# Patient Record
Sex: Female | Born: 1950
Health system: Southern US, Community
[De-identification: ages and names within clinical notes are randomized; demographics above are authoritative.]

## PROBLEM LIST (undated history)

## (undated) DIAGNOSIS — E785 Hyperlipidemia, unspecified: Secondary | ICD-10-CM

## (undated) DIAGNOSIS — M7750 Other enthesopathy of unspecified foot: Secondary | ICD-10-CM

## (undated) DIAGNOSIS — K5732 Diverticulitis of large intestine without perforation or abscess without bleeding: Secondary | ICD-10-CM

## (undated) DIAGNOSIS — K219 Gastro-esophageal reflux disease without esophagitis: Secondary | ICD-10-CM

## (undated) DIAGNOSIS — E049 Nontoxic goiter, unspecified: Secondary | ICD-10-CM

## (undated) DIAGNOSIS — Z6841 Body Mass Index (BMI) 40.0 and over, adult: Secondary | ICD-10-CM

## (undated) DIAGNOSIS — E559 Vitamin D deficiency, unspecified: Secondary | ICD-10-CM

## (undated) DIAGNOSIS — E119 Type 2 diabetes mellitus without complications: Secondary | ICD-10-CM

## (undated) DIAGNOSIS — F419 Anxiety disorder, unspecified: Secondary | ICD-10-CM

## (undated) DIAGNOSIS — I10 Essential (primary) hypertension: Secondary | ICD-10-CM

## (undated) DIAGNOSIS — Z8619 Personal history of other infectious and parasitic diseases: Secondary | ICD-10-CM

## (undated) DIAGNOSIS — Z86010 Personal history of colonic polyps: Secondary | ICD-10-CM

## (undated) DIAGNOSIS — L219 Seborrheic dermatitis, unspecified: Secondary | ICD-10-CM

## (undated) DIAGNOSIS — E039 Hypothyroidism, unspecified: Secondary | ICD-10-CM

## (undated) DIAGNOSIS — M1990 Unspecified osteoarthritis, unspecified site: Secondary | ICD-10-CM

## (undated) HISTORY — DX: Diverticulitis of large intestine without perforation or abscess without bleeding: K57.32

## (undated) HISTORY — DX: Body Mass Index (BMI) 40.0 and over, adult: Z684

## (undated) HISTORY — DX: Gastro-esophageal reflux disease without esophagitis: K21.9

## (undated) HISTORY — PX: POLYPECTOMY: SHX149

## (undated) HISTORY — DX: Seborrheic dermatitis, unspecified: L21.9

## (undated) HISTORY — PX: VAGINAL HYSTERECTOMY: SUR661

## (undated) HISTORY — DX: Personal history of colonic polyps: Z86.010

## (undated) HISTORY — DX: Vitamin D deficiency, unspecified: E55.9

## (undated) HISTORY — PX: TONSILLECTOMY: SUR1361

## (undated) HISTORY — DX: Nontoxic goiter, unspecified: E04.9

## (undated) HISTORY — PX: COLONOSCOPY: SHX174

## (undated) HISTORY — DX: Personal history of other infectious and parasitic diseases: Z86.19

## (undated) HISTORY — DX: Morbid (severe) obesity due to excess calories: E66.01

## (undated) HISTORY — DX: Anxiety disorder, unspecified: F41.9

## (undated) HISTORY — DX: Hypothyroidism, unspecified: E03.9

## (undated) HISTORY — DX: Unspecified osteoarthritis, unspecified site: M19.90

## (undated) HISTORY — PX: UPPER GASTROINTESTINAL ENDOSCOPY: SHX188

## (undated) HISTORY — DX: Hyperlipidemia, unspecified: E78.5

---

## 2000-11-06 ENCOUNTER — Emergency Department (HOSPITAL_COMMUNITY): Admission: EM | Admit: 2000-11-06 | Discharge: 2000-11-06 | Payer: Self-pay | Admitting: Emergency Medicine

## 2001-10-12 ENCOUNTER — Other Ambulatory Visit: Admission: RE | Admit: 2001-10-12 | Discharge: 2001-10-12 | Payer: Self-pay | Admitting: *Deleted

## 2002-01-13 ENCOUNTER — Encounter: Payer: Self-pay | Admitting: Internal Medicine

## 2002-01-13 ENCOUNTER — Encounter: Admission: RE | Admit: 2002-01-13 | Discharge: 2002-01-13 | Payer: Self-pay | Admitting: Internal Medicine

## 2002-02-16 ENCOUNTER — Ambulatory Visit (HOSPITAL_COMMUNITY): Admission: RE | Admit: 2002-02-16 | Discharge: 2002-02-16 | Payer: Self-pay | Admitting: *Deleted

## 2002-03-15 HISTORY — PX: COLONOSCOPY: SHX174

## 2002-05-25 ENCOUNTER — Emergency Department (HOSPITAL_COMMUNITY): Admission: EM | Admit: 2002-05-25 | Discharge: 2002-05-25 | Payer: Self-pay | Admitting: Emergency Medicine

## 2002-09-12 ENCOUNTER — Other Ambulatory Visit: Admission: RE | Admit: 2002-09-12 | Discharge: 2002-09-12 | Payer: Self-pay | Admitting: *Deleted

## 2003-05-25 ENCOUNTER — Encounter (INDEPENDENT_AMBULATORY_CARE_PROVIDER_SITE_OTHER): Payer: Self-pay | Admitting: Specialist

## 2003-05-25 ENCOUNTER — Ambulatory Visit (HOSPITAL_COMMUNITY): Admission: RE | Admit: 2003-05-25 | Discharge: 2003-05-25 | Payer: Self-pay | Admitting: *Deleted

## 2003-05-25 DIAGNOSIS — Z8619 Personal history of other infectious and parasitic diseases: Secondary | ICD-10-CM

## 2003-05-25 HISTORY — DX: Personal history of other infectious and parasitic diseases: Z86.19

## 2003-05-25 HISTORY — PX: ESOPHAGOGASTRODUODENOSCOPY: SHX1529

## 2006-01-21 ENCOUNTER — Emergency Department (HOSPITAL_COMMUNITY): Admission: EM | Admit: 2006-01-21 | Discharge: 2006-01-21 | Payer: Self-pay | Admitting: Emergency Medicine

## 2006-09-10 ENCOUNTER — Encounter: Admission: RE | Admit: 2006-09-10 | Discharge: 2006-09-10 | Payer: Self-pay | Admitting: Internal Medicine

## 2006-09-22 ENCOUNTER — Other Ambulatory Visit: Admission: RE | Admit: 2006-09-22 | Discharge: 2006-09-22 | Payer: Self-pay | Admitting: Interventional Radiology

## 2006-09-22 ENCOUNTER — Encounter: Admission: RE | Admit: 2006-09-22 | Discharge: 2006-09-22 | Payer: Self-pay | Admitting: Internal Medicine

## 2006-09-22 ENCOUNTER — Encounter (INDEPENDENT_AMBULATORY_CARE_PROVIDER_SITE_OTHER): Payer: Self-pay | Admitting: Specialist

## 2006-10-31 ENCOUNTER — Emergency Department (HOSPITAL_COMMUNITY): Admission: EM | Admit: 2006-10-31 | Discharge: 2006-10-31 | Payer: Self-pay | Admitting: Emergency Medicine

## 2007-07-19 ENCOUNTER — Other Ambulatory Visit: Admission: RE | Admit: 2007-07-19 | Discharge: 2007-07-19 | Payer: Self-pay | Admitting: *Deleted

## 2008-05-28 ENCOUNTER — Encounter: Admission: RE | Admit: 2008-05-28 | Discharge: 2008-05-28 | Payer: Self-pay | Admitting: Endocrinology

## 2008-11-12 ENCOUNTER — Encounter: Admission: RE | Admit: 2008-11-12 | Discharge: 2008-11-12 | Payer: Self-pay | Admitting: Endocrinology

## 2009-07-14 ENCOUNTER — Emergency Department (HOSPITAL_COMMUNITY): Admission: EM | Admit: 2009-07-14 | Discharge: 2009-07-14 | Payer: Self-pay | Admitting: Emergency Medicine

## 2009-09-19 ENCOUNTER — Other Ambulatory Visit: Admission: RE | Admit: 2009-09-19 | Discharge: 2009-09-19 | Payer: Self-pay | Admitting: Internal Medicine

## 2010-06-04 ENCOUNTER — Encounter: Admission: RE | Admit: 2010-06-04 | Discharge: 2010-06-04 | Payer: Self-pay | Admitting: Endocrinology

## 2010-08-11 ENCOUNTER — Emergency Department (HOSPITAL_COMMUNITY): Admission: EM | Admit: 2010-08-11 | Discharge: 2010-08-11 | Payer: Self-pay | Admitting: Family Medicine

## 2010-12-10 ENCOUNTER — Other Ambulatory Visit: Payer: Self-pay | Admitting: Internal Medicine

## 2010-12-10 ENCOUNTER — Ambulatory Visit
Admission: RE | Admit: 2010-12-10 | Discharge: 2010-12-10 | Disposition: A | Discharge status: Home / Self Care | Payer: Managed Care, Other (non HMO) | Source: Ambulatory Visit | Attending: Internal Medicine | Admitting: Internal Medicine

## 2010-12-10 DIAGNOSIS — M79602 Pain in left arm: Secondary | ICD-10-CM

## 2011-01-15 LAB — URINE CULTURE
Colony Count: >=100000
Culture  Setup Time: 201110110146

## 2011-01-15 LAB — POCT URINALYSIS DIPSTICK
Ketones, ur: NEGATIVE mg/dL
Nitrite: POSITIVE — AB
Protein, ur: 100 mg/dL — AB
pH: 5.5 (ref 5.0–8.0)

## 2011-03-20 NOTE — Op Note (Signed)
   Sandy Daniels, Sandy Daniels                            ACCOUNT NO.:  [id number]   MEDICAL RECORD NO.:  [id number]                   PATIENT TYPE:  AMB   LOCATION:  ENDO                                 FACILITY:  [facility]   PHYSICIAN:  Georgiana Spinner, M.D.                 DATE OF BIRTH:  January 03, 1951   DATE OF PROCEDURE:  DATE OF DISCHARGE:                                 OPERATIVE REPORT   PROCEDURE:  Upper endoscopy with biopsy.   INDICATIONS FOR PROCEDURE:  Gastroesophageal reflux disease.   ANESTHESIA:  Demerol 80, Versed 8 mg.   DESCRIPTION OF PROCEDURE:  With the patient mildly sedated in the left  lateral decubitus position, the Olympus videoscopic endoscope was inserted  in the mouth and passed under direct vision through the esophagus which  appeared normal. The distal esophagus was not well seen to clearly delineate  the GE junction well therefore it was photographed and biopsies were taken  of this area to detect any evidence of Barrett's. We entered into the  stomach. The fundus, body, antrum, duodenal bulb and second portion of the  duodenum were visualized. From this point, the endoscope was slowly  withdrawn taking circumferential views of the duodenal mucosa until the  endoscope was then pulled back in the stomach, placed in retroflexion to  view the stomach from below. The endoscope was straightened and withdrawn  taking circumferential views of the remaining gastric and esophageal mucosa  stopping to photograph and biopsy along the way changes of what appeared to  be gastritis localized at the body and less so in the fundus of the stomach.  The patient's vital signs and pulse oximeter remained stable. The patient  tolerated the procedure well without apparent complications.   FINDINGS:  Changes of gastritis biopsied and biopsy of distal esophagus.  Await biopsy report. The patient will call me for results and followup with  me as an outpatient.                       Georgiana Spinner, M.D.    GMO/MEDQ  D:  05/25/2003  T:  05/25/2003  Job:  [id number]

## 2011-03-20 NOTE — Procedures (Signed)
[facility]  Patient:    Sandy Daniels, Sandy Daniels Visit Number: [id number] MRN: [id number]          Service Type: END Location: ENDO Attending Physician:  Sabino Gasser Dictated by:   Sabino Gasser, M.D. Proc. Date: 03/15/02 Admit Date:  02/16/2002 Discharge Date: 02/16/2002                             Procedure Report  PROCEDURE:  Colonoscopy.  INDICATION FOR PROCEDURE:  Rectal bleeding.  ANESTHESIA:  Demerol 80, Versed 6 mg.  DESCRIPTION OF PROCEDURE:  With the patient mildly sedated in the left lateral decubitus position, the Olympus videoscopic pediatric colonoscope was inserted in the rectum and passed under direct vision to the cecum identified by the ileocecal valve and appendiceal orifice both of which were photographed. From this point, the colonoscope was slowly withdrawn taking circumferential views of the entire colonic mucosa, stopping in the rectum which appeared normal in direct and showed hemorrhoids on retroflexed view.  The endoscope was straightened and withdrawn. The patients vital signs and pulse oximeter remained stable. The patient tolerated the procedure well without apparent complications.  FINDINGS:  Internal hemorrhoids otherwise unremarkable examination. Dictated by:   Sabino Gasser, M.D. Attending Physician:  Sabino Gasser DD:  03/15/02 TD:  03/16/02 Job: [id number]

## 2011-06-02 ENCOUNTER — Other Ambulatory Visit: Payer: Self-pay | Admitting: Endocrinology

## 2011-06-02 DIAGNOSIS — E041 Nontoxic single thyroid nodule: Secondary | ICD-10-CM

## 2011-06-09 ENCOUNTER — Ambulatory Visit
Admission: RE | Admit: 2011-06-09 | Discharge: 2011-06-09 | Disposition: A | Discharge status: Home / Self Care | Payer: Managed Care, Other (non HMO) | Source: Ambulatory Visit | Attending: Endocrinology | Admitting: Endocrinology

## 2011-06-09 ENCOUNTER — Other Ambulatory Visit: Payer: Self-pay | Admitting: Endocrinology

## 2011-06-09 DIAGNOSIS — E041 Nontoxic single thyroid nodule: Secondary | ICD-10-CM

## 2011-10-08 HISTORY — PX: TRANSTHORACIC ECHOCARDIOGRAM: SHX275

## 2011-10-08 HISTORY — PX: NM MYOVIEW LTD: HXRAD82

## 2011-10-21 ENCOUNTER — Ambulatory Visit: Payer: Managed Care, Other (non HMO) | Admitting: *Deleted

## 2011-11-04 ENCOUNTER — Ambulatory Visit: Payer: Managed Care, Other (non HMO) | Admitting: *Deleted

## 2013-01-31 DIAGNOSIS — K5732 Diverticulitis of large intestine without perforation or abscess without bleeding: Secondary | ICD-10-CM

## 2013-01-31 HISTORY — DX: Diverticulitis of large intestine without perforation or abscess without bleeding: K57.32

## 2013-02-25 ENCOUNTER — Encounter (HOSPITAL_COMMUNITY): Payer: Self-pay

## 2013-02-25 ENCOUNTER — Emergency Department (HOSPITAL_COMMUNITY)
Admission: EM | Admit: 2013-02-25 | Discharge: 2013-02-26 | Disposition: A | Discharge status: Home / Self Care | Payer: Managed Care, Other (non HMO) | Attending: Internal Medicine | Admitting: Internal Medicine

## 2013-02-25 ENCOUNTER — Emergency Department (HOSPITAL_COMMUNITY): Payer: Managed Care, Other (non HMO)

## 2013-02-25 DIAGNOSIS — Z90710 Acquired absence of both cervix and uterus: Secondary | ICD-10-CM | POA: Insufficient documentation

## 2013-02-25 DIAGNOSIS — Z79899 Other long term (current) drug therapy: Secondary | ICD-10-CM | POA: Insufficient documentation

## 2013-02-25 DIAGNOSIS — D72829 Elevated white blood cell count, unspecified: Secondary | ICD-10-CM | POA: Insufficient documentation

## 2013-02-25 DIAGNOSIS — R52 Pain, unspecified: Secondary | ICD-10-CM | POA: Insufficient documentation

## 2013-02-25 DIAGNOSIS — R109 Unspecified abdominal pain: Secondary | ICD-10-CM

## 2013-02-25 DIAGNOSIS — R609 Edema, unspecified: Secondary | ICD-10-CM | POA: Insufficient documentation

## 2013-02-25 DIAGNOSIS — K5900 Constipation, unspecified: Secondary | ICD-10-CM | POA: Insufficient documentation

## 2013-02-25 DIAGNOSIS — F17200 Nicotine dependence, unspecified, uncomplicated: Secondary | ICD-10-CM | POA: Insufficient documentation

## 2013-02-25 DIAGNOSIS — K5732 Diverticulitis of large intestine without perforation or abscess without bleeding: Secondary | ICD-10-CM

## 2013-02-25 DIAGNOSIS — Z8739 Personal history of other diseases of the musculoskeletal system and connective tissue: Secondary | ICD-10-CM | POA: Insufficient documentation

## 2013-02-25 DIAGNOSIS — K5792 Diverticulitis of intestine, part unspecified, without perforation or abscess without bleeding: Secondary | ICD-10-CM

## 2013-02-25 DIAGNOSIS — R1032 Left lower quadrant pain: Secondary | ICD-10-CM | POA: Insufficient documentation

## 2013-02-25 DIAGNOSIS — E669 Obesity, unspecified: Secondary | ICD-10-CM | POA: Insufficient documentation

## 2013-02-25 DIAGNOSIS — M129 Arthropathy, unspecified: Secondary | ICD-10-CM | POA: Insufficient documentation

## 2013-02-25 DIAGNOSIS — Z880 Allergy status to penicillin: Secondary | ICD-10-CM | POA: Insufficient documentation

## 2013-02-25 DIAGNOSIS — R142 Eructation: Secondary | ICD-10-CM | POA: Insufficient documentation

## 2013-02-25 DIAGNOSIS — I10 Essential (primary) hypertension: Secondary | ICD-10-CM | POA: Insufficient documentation

## 2013-02-25 DIAGNOSIS — R141 Gas pain: Secondary | ICD-10-CM | POA: Insufficient documentation

## 2013-02-25 DIAGNOSIS — E039 Hypothyroidism, unspecified: Secondary | ICD-10-CM | POA: Insufficient documentation

## 2013-02-25 HISTORY — DX: Essential (primary) hypertension: I10

## 2013-02-25 HISTORY — DX: Other enthesopathy of unspecified foot and ankle: M77.50

## 2013-02-25 LAB — URINALYSIS, ROUTINE W REFLEX MICROSCOPIC
Bilirubin Urine: NEGATIVE
Glucose, UA: NEGATIVE mg/dL
Hgb urine dipstick: NEGATIVE
Ketones, ur: NEGATIVE mg/dL
Protein, ur: NEGATIVE mg/dL
pH: 5.5 (ref 5.0–8.0)

## 2013-02-25 LAB — COMPREHENSIVE METABOLIC PANEL
ALT: 16 U/L (ref 0–35)
AST: 15 U/L (ref 0–37)
Albumin: 3.1 g/dL — ABNORMAL LOW (ref 3.5–5.2)
Alkaline Phosphatase: 82 U/L (ref 39–117)
CO2: 26 mEq/L (ref 19–32)
Chloride: 103 mEq/L (ref 96–112)
Creatinine, Ser: 0.86 mg/dL (ref 0.50–1.10)
GFR calc non Af Amer: 70 mL/min — ABNORMAL LOW (ref >90)
Potassium: 3.5 mEq/L (ref 3.5–5.1)
Sodium: 137 mEq/L (ref 135–145)
Total Bilirubin: 0.3 mg/dL (ref 0.3–1.2)

## 2013-02-25 LAB — CBC WITH DIFFERENTIAL/PLATELET
Basophils Absolute: 0 10*3/uL (ref 0.0–0.1)
Basophils Relative: 0 % (ref 0–1)
HCT: 42.2 % (ref 36.0–46.0)
Lymphocytes Relative: 23 % (ref 12–46)
MCHC: 33.4 g/dL (ref 30.0–36.0)
Monocytes Absolute: 1.1 10*3/uL — ABNORMAL HIGH (ref 0.1–1.0)
Neutro Abs: 12.2 10*3/uL — ABNORMAL HIGH (ref 1.7–7.7)
Neutrophils Relative %: 70 % (ref 43–77)
RDW: 14.6 % (ref 11.5–15.5)
WBC: 17.5 10*3/uL — ABNORMAL HIGH (ref 4.0–10.5)

## 2013-02-25 LAB — URINE MICROSCOPIC-ADD ON

## 2013-02-25 MED ORDER — IOHEXOL 300 MG/ML  SOLN
50.0000 mL | Freq: Once | INTRAMUSCULAR | Status: AC | PRN
  Administered 2013-02-25: 50 mL via ORAL

## 2013-02-25 MED ORDER — SODIUM CHLORIDE 0.9 % IV SOLN
1000.0000 mL | INTRAVENOUS | Status: DC
  Administered 2013-02-25: 1000 mL via INTRAVENOUS

## 2013-02-25 MED ORDER — LORAZEPAM 2 MG/ML IJ SOLN
1.0000 mg | Freq: Once | INTRAMUSCULAR | Status: AC
  Administered 2013-02-25: 1 mg via INTRAVENOUS
  Filled 2013-02-25: qty 1

## 2013-02-25 MED ORDER — ONDANSETRON HCL 4 MG/2ML IJ SOLN
4.0000 mg | Freq: Once | INTRAMUSCULAR | Status: AC
  Administered 2013-02-25: 4 mg via INTRAVENOUS
  Filled 2013-02-25: qty 2

## 2013-02-25 MED ORDER — CIPROFLOXACIN HCL 500 MG PO TABS: 500.0000 mg | ORAL_TABLET | Freq: Two times a day (BID) | ORAL | Status: DC

## 2013-02-25 MED ORDER — MORPHINE SULFATE 4 MG/ML IJ SOLN
4.0000 mg | Freq: Once | INTRAMUSCULAR | Status: AC
  Administered 2013-02-25: 4 mg via INTRAVENOUS
  Filled 2013-02-25: qty 1

## 2013-02-25 MED ORDER — SODIUM CHLORIDE 0.9 % IV SOLN: 1000.0000 mL | INTRAVENOUS | Status: DC

## 2013-02-25 MED ORDER — CIPROFLOXACIN IN D5W 400 MG/200ML IV SOLN
400.0000 mg | Freq: Once | INTRAVENOUS | Status: AC
  Administered 2013-02-25: 400 mg via INTRAVENOUS
  Filled 2013-02-25: qty 200

## 2013-02-25 MED ORDER — METRONIDAZOLE IN NACL 5-0.79 MG/ML-% IV SOLN
500.0000 mg | Freq: Once | INTRAVENOUS | Status: AC
  Administered 2013-02-25: 500 mg via INTRAVENOUS
  Filled 2013-02-25: qty 100

## 2013-02-25 MED ORDER — IOHEXOL 300 MG/ML  SOLN
100.0000 mL | Freq: Once | INTRAMUSCULAR | Status: AC | PRN
  Administered 2013-02-25: 100 mL via INTRAVENOUS

## 2013-02-25 MED ORDER — OXYCODONE-ACETAMINOPHEN 5-325 MG PO TABS: 1.0000 | ORAL_TABLET | ORAL | Status: DC | PRN

## 2013-02-25 MED ORDER — METRONIDAZOLE 500 MG PO TABS: 500.0000 mg | ORAL_TABLET | Freq: Two times a day (BID) | ORAL | Status: DC

## 2013-02-25 NOTE — ED Provider Notes (Signed)
Medical screening examination/treatment/procedure(s) were conducted as a shared visit with non-physician practitioner(s) and myself.  I personally evaluated the patient during the encounter  Pt seen and examined--ct shows diverticulitis with possible abscess, abx started, pt to be admitted by triad  Toy Baker, MD 02/25/13 2147

## 2013-02-25 NOTE — ED Notes (Signed)
Patient transported to CT 

## 2013-02-25 NOTE — ED Notes (Signed)
Pt presents with stomach pain since Thursday.  Pt has has laxatives, fleets enema and suppository  without relief.  LBM Wednesday.  Denies N/V/D and fever

## 2013-02-25 NOTE — ED Provider Notes (Signed)
Medical screening examination/treatment/procedure(s) were performed by non-physician practitioner and as supervising physician I was immediately available for consultation/collaboration.  Kalyssa Anker T Kinnie Kaupp, MD 02/25/13 2317 

## 2013-02-25 NOTE — ED Notes (Signed)
MD at bedside. 

## 2013-02-25 NOTE — ED Provider Notes (Signed)
History     CSN: [id number]  Arrival date & time 02/25/13  7829   First MD Initiated Contact with Patient 02/25/13 1813      Chief Complaint  Patient presents with  . Constipation  . Abdominal Pain    (Consider location/radiation/quality/duration/timing/severity/associated sxs/prior treatment) Patient is a 62 y.o. female presenting with constipation and abdominal pain. The history is provided by the patient and medical records. No language interpreter was used.  Constipation  The current episode started 3 to 5 days ago. The onset was gradual. The problem has been gradually worsening. The pain is moderate. The stool is described as hard. There was no prior successful therapy. Prior unsuccessful therapies include stool softeners, laxatives and enemas. Associated symptoms include abdominal pain. Pertinent negatives include no anorexia, no fever, no diarrhea, no hemorrhoids, no nausea, no rectal pain, no vomiting, no hematuria, no vaginal bleeding, no vaginal discharge, no chest pain, no headaches, no coughing, no difficulty breathing and no rash. She has been behaving normally. She has been eating and drinking normally. Urine output has been normal. Her past medical history does not include inflammatory bowel disease or recent abdominal injury. There were no sick contacts. She has received no recent medical care.  Abdominal Pain Pain location:  Suprapubic and LLQ Pain quality: cramping, sharp and shooting   Pain radiates to:  Does not radiate Pain severity:  Severe Onset quality:  Gradual Duration:  2 days Timing:  Constant Progression:  Waxing and waning Chronicity:  New Context: laxative use   Context: not sick contacts   Relieved by:  Nothing Worsened by:  Nothing tried Associated symptoms: constipation   Associated symptoms: no anorexia, no chest pain, no cough, no diarrhea, no dysuria, no fatigue, no fever, no hematuria, no nausea, no shortness of breath, no vaginal bleeding, no  vaginal discharge and no vomiting   Risk factors: obesity     Sandy Daniels is a 62 y.o. female  with a hx of hypertension, hypothyroid presents to the Emergency Department complaining of gradual, persistent, progressively worsening suprapubic and left lower part abdominal pain described as cramping and sharp, rated at a 8/10, nonradiating onset yesterday afternoon. Patient states her last bowel movement was on Wednesday and she feels that she has been constipated. Patient has been taking laxatives, suppositories and Fleet enema without reduction of a bowel movement. Patient states her bowel movement Wednesday was hard but there was no blood. She has no history of hemorrhoids and there is no pain with bowel movement. She denies nausea vomiting, diarrhea, fever, chills, headache, neck pain, chest pain, shortness of breath, weakness, dizziness, syncope, dysuria, hematuria, vaginal discharge. Patient has had a partial hysterectomy with removal of her uterus which was done transabdominally. Nothing makes the pain better and nothing makes the pain worse.   Patient states she is passing flatus, but this does change her ssx.  Past Medical History  Diagnosis Date  . Hypertension   . Thyroid disease   . Arthritis   . Tendonitis of ankle     History reviewed. No pertinent past surgical history.  No family history on file.  History  Substance Use Topics  . Smoking status: Current Every Day Smoker  . Smokeless tobacco: Not on file  . Alcohol Use: No    OB History   Grav Para Term Preterm Abortions TAB SAB Ect Mult Living                  Review of Systems  Constitutional: Negative for fever, diaphoresis, appetite change, fatigue and unexpected weight change.  HENT: Negative for mouth sores, trouble swallowing, neck pain and neck stiffness.   Respiratory: Negative for cough, chest tightness, shortness of breath, wheezing and stridor.   Cardiovascular: Negative for chest pain and palpitations.   Gastrointestinal: Positive for abdominal pain, constipation and abdominal distention. Negative for nausea, vomiting, diarrhea, blood in stool, rectal pain, anorexia and hemorrhoids.  Genitourinary: Negative for dysuria, urgency, frequency, hematuria, flank pain, vaginal bleeding, vaginal discharge and difficulty urinating.  Musculoskeletal: Negative for back pain.  Skin: Negative for rash.  Neurological: Negative for weakness and headaches.  Hematological: Negative for adenopathy.  Psychiatric/Behavioral: Negative for confusion.  All other systems reviewed and are negative.    Allergies  Penicillins; Betadine; and Neosporin  Home Medications   Current Outpatient Rx  Name  Route  Sig  Dispense  Refill  . levothyroxine (SYNTHROID, LEVOTHROID) 100 MCG tablet   Oral   Take 100 mcg by mouth daily before breakfast.         . triamterene-hydrochlorothiazide (MAXZIDE-25) 37.5-25 MG per tablet   Oral   Take 1 tablet by mouth daily.           BP 172/87  Temp(Src) 99.1 F (37.3 C) (Oral)  Resp 20  Ht 5\' 6"  (1.676 m)  Wt 289 lb (131.09 kg)  BMI 46.67 kg/m2  SpO2 100%  Physical Exam  Nursing note and vitals reviewed. Constitutional: She is oriented to person, place, and time. She appears well-developed and well-nourished.  HENT:  Head: Normocephalic and atraumatic.  Mouth/Throat: Oropharynx is clear and moist.  Eyes: Conjunctivae and EOM are normal. Pupils are equal, round, and reactive to light. No scleral icterus.  Neck: Normal range of motion and full passive range of motion without pain. No rigidity.  Cardiovascular: Normal rate, regular rhythm, S1 normal, S2 normal, normal heart sounds and intact distal pulses.   Pulses:      Radial pulses are 2+ on the right side, and 2+ on the left side.       Dorsalis pedis pulses are 2+ on the right side, and 2+ on the left side.       Posterior tibial pulses are 2+ on the right side, and 2+ on the left side.  Pulmonary/Chest:  Effort normal and breath sounds normal. No accessory muscle usage. Not tachypneic. No respiratory distress. She has no decreased breath sounds. She has no wheezes. She has no rhonchi. She has no rales. She exhibits no tenderness and no bony tenderness.  Abdominal: Soft. Normal appearance and bowel sounds are normal. She exhibits no distension and no mass. There is tenderness in the suprapubic area and left lower quadrant. There is guarding. There is no rigidity, no rebound and no CVA tenderness.  Obese abdomen  Musculoskeletal: Normal range of motion. She exhibits edema (chronic L leg, nonpitting). She exhibits no tenderness.  Lymphadenopathy:    She has no cervical adenopathy.  Neurological: She is alert and oriented to person, place, and time. She exhibits normal muscle tone. Coordination normal.  Skin: Skin is warm and dry. No rash noted. No erythema.  Psychiatric: She has a normal mood and affect.    ED Course  Procedures (including critical care time)  Labs Reviewed  CBC WITH DIFFERENTIAL - Abnormal; Notable for the following:    WBC 17.5 (*)    Neutro Abs 12.2 (*)    Monocytes Absolute 1.1 (*)    All other components within normal limits  COMPREHENSIVE METABOLIC PANEL - Abnormal; Notable for the following:    Glucose, Bld 149 (*)    Albumin 3.1 (*)    GFR calc non Af Amer 70 (*)    GFR calc Af Amer 82 (*)    All other components within normal limits  URINALYSIS, ROUTINE W REFLEX MICROSCOPIC - Abnormal; Notable for the following:    Leukocytes, UA SMALL (*)    All other components within normal limits  LIPASE, BLOOD  URINE MICROSCOPIC-ADD ON  OCCULT BLOOD, POC DEVICE   Ct Abdomen Pelvis W Contrast  02/25/2013  *RADIOLOGY REPORT*  Clinical Data: Abdominal pain  CT ABDOMEN AND PELVIS WITH CONTRAST  Technique:  Multidetector CT imaging of the abdomen and pelvis was performed following the standard protocol during bolus administration of intravenous contrast.  Contrast:  OMNIPAQUE IOHEXOL 300 MG/ML  SOLN  Comparison: None.  Findings: Minimal curvilinear bilateral lower lobe atelectasis or scarring noted.  6 mm too small to characterize left posterior mid renal cortical hypodensity, image 33.  Liver, gallbladder, spleen, pancreas, adrenal glands, and right kidney are normal.  Moderate atheromatous aortic calcification without aneurysm.  Short segmental sigmoid colonic wall thickening, mesenteric fat stranding, and trace adjacent fluid is identified, for example image 56.  More focal amorphous fluid density adjacent to this segment measuring 2.2 cm image 56 could indicate developing phlegmon or early abscess.  Ovaries are unremarkable.  Uterus presumed surgically absent. Bladder is normal.  Small bowel is unremarkable.  Colonic diverticuli are noted.  No free air, lymphadenopathy, or ascites.  No acute osseous abnormality. Lumbar spine degenerative change identified.  IMPRESSION: Short segmental sigmoid colonic wall thickening, surrounding stranding, and trace pericolonic fluid, most compatible with diverticulitis.  More focal amorphous fluid density adjacent to this segment measuring 2.2 cm image 56 could indicate developing phlegmon or early abscess.  After symptoms resolve, if the patient has not had screening colonoscopy, this could be considered to exclude underlying malignancy.   Original Report Authenticated By: Christiana Pellant, M.D.      1. Diverticulitis   2. Abdominal pain, acute       MDM  Donnamae Jude presents with left lower quadrant abdominal pain x3 days and constipation.  Patient with leukocytosis of 17.5, lipase and CMP within normal limits, fecal occult negative and no impaction palpable on rectal exam, UA without evidence of urinary tract infection, CT scan with diverticulitis and fluid density returning for phlegmon or early abscess.    Patient alert, oriented, nontoxic, nonseptic appearing.  Vital signs stable. Patient primary care with [facility]  medical, Dr Merri Brunette.  Will proceed with admission.    Dr. Freida Busman was consulted, evaluated this patient with me and agrees with the plan.    10:50 PM Pt offered admission by Dr Ardyth Harps and she has refused at this time.  Pt has been counseled about the importance of strict follow-up.  I have also discussed reasons to return immediately to the ER.  Patient expresses understanding and agrees with plan.  Will d/c home with Cipro and Flagyl for 21 days per the recommendation of Dr Ardyth Harps.     Lauro Ronne Savoia, PA-C 02/25/13 2255

## 2013-02-25 NOTE — Consult Note (Signed)
Requesting physician: Lorre Nick, EDP  Primary Care Physician: Londell Moh, MD  Reason for consultation: Abdominal pain, CT scan consistent with diverticulitis   History of Present Illness: Patient is a pleasant 62 year old obese African American woman with past medical history significant for hypertension and hypothyroidism. She presents today with 2 days of left lower quadrant abdominal pain and constipation. She denies fever or chills. Also denies nausea or vomiting. Has been afebrile in the emergency department she does have a white blood cell count of 17.5. CT scan was consistent with diverticulitis and possibly an early phlegmon. I have been asked to see her regarding potential admission.  Allergies:   Allergies  Allergen Reactions  . Penicillins Hives and Swelling  . Betadine (Povidone Iodine) Rash    Severe bumps and blistering  . Neosporin (Neomycin-Bacitracin Zn-Polymyx) Rash    blistering      Past Medical History  Diagnosis Date  . Hypertension   . Thyroid disease   . Arthritis   . Tendonitis of ankle     History reviewed. No pertinent past surgical history.  Scheduled Meds:  Continuous Infusions: . sodium chloride 1,000 mL (02/25/13 1852)  . sodium chloride    . metronidazole     PRN Meds:.  Social History:  reports that she has been smoking.  She does not have any smokeless tobacco history on file. She reports that she does not drink alcohol or use illicit drugs.  No family history on file.  Review of Systems:  Constitutional: Denies fever, chills, diaphoresis, appetite change and fatigue.  HEENT: Denies photophobia, eye pain, redness, hearing loss, ear pain, congestion, sore throat, rhinorrhea, sneezing, mouth sores, trouble swallowing, neck pain, neck stiffness and tinnitus.   Respiratory: Denies SOB, DOE, cough, chest tightness,  and wheezing.   Cardiovascular: Denies chest pain, palpitations and leg swelling.  Gastrointestinal: Denies  nausea, vomiting,  diarrhea, constipation, blood in stool and abdominal distention.  Genitourinary: Denies dysuria, urgency, frequency, hematuria, flank pain and difficulty urinating.  Musculoskeletal: Denies myalgias, back pain, joint swelling, arthralgias and gait problem.  Skin: Denies pallor, rash and wound.  Neurological: Denies dizziness, seizures, syncope, weakness, light-headedness, numbness and headaches.  Hematological: Denies adenopathy. Easy bruising, personal or family bleeding history  Psychiatric/Behavioral: Denies suicidal ideation, mood changes, confusion, nervousness, sleep disturbance and agitation   Physical Exam: Blood pressure 137/73, pulse 95, temperature 99.1 F (37.3 C), temperature source Oral, resp. rate 18, height 5\' 6"  (1.676 m), weight 131.09 kg (289 lb), SpO2 100.00%. General: Alert, awake, oriented x3, no acute distress, morbidly obese. HEENT: Normocephalic, atraumatic, pupils equal round and reactive to light, intact extraocular movements, moist mucous membranes. Neck: Supple, no JVD, no lymphadenopathy, no bruits, no goiter. Cardiovascular: Regular rate and rhythm, no murmurs, rubs or gallops. Lungs: Clear to auscultation bilaterally. Abdomen: Obese, soft, nontender to palpation, nondistended, positive bowel sounds, no masses organomegaly noted, no rebound or guarding. Extremities: 2+ pitting edema bilaterally, positive pedal pulses. Neurologic: Grossly intact and nonfocal.  Labs on Admission:  Results for orders placed during the hospital encounter of 02/25/13 (from the past 48 hour(s))  CBC WITH DIFFERENTIAL     Status: Abnormal   Collection Time    02/25/13  6:45 PM      Result Value Range   WBC 17.5 (*) 4.0 - 10.5 K/uL   RBC 5.02  3.87 - 5.11 MIL/uL   Hemoglobin 14.1  12.0 - 15.0 g/dL   HCT 16.1  09.6 - 04.5 %   MCV 84.1  78.0 - 100.0 fL   MCH 28.1  26.0 - 34.0 pg   MCHC 33.4  30.0 - 36.0 g/dL   RDW 40.9  81.1 - 91.4 %   Platelets 274  150 - 400  K/uL   Neutrophils Relative 70  43 - 77 %   Neutro Abs 12.2 (*) 1.7 - 7.7 K/uL   Lymphocytes Relative 23  12 - 46 %   Lymphs Abs 4.0  0.7 - 4.0 K/uL   Monocytes Relative 6  3 - 12 %   Monocytes Absolute 1.1 (*) 0.1 - 1.0 K/uL   Eosinophils Relative 2  0 - 5 %   Eosinophils Absolute 0.3  0.0 - 0.7 K/uL   Basophils Relative 0  0 - 1 %   Basophils Absolute 0.0  0.0 - 0.1 K/uL  COMPREHENSIVE METABOLIC PANEL     Status: Abnormal   Collection Time    02/25/13  6:45 PM      Result Value Range   Sodium 137  135 - 145 mEq/L   Potassium 3.5  3.5 - 5.1 mEq/L   Chloride 103  96 - 112 mEq/L   CO2 26  19 - 32 mEq/L   Glucose, Bld 149 (*) 70 - 99 mg/dL   BUN 11  6 - 23 mg/dL   Creatinine, Ser 7.82  0.50 - 1.10 mg/dL   Calcium 9.4  8.4 - 95.6 mg/dL   Total Protein 7.4  6.0 - 8.3 g/dL   Albumin 3.1 (*) 3.5 - 5.2 g/dL   AST 15  0 - 37 U/L   ALT 16  0 - 35 U/L   Alkaline Phosphatase 82  39 - 117 U/L   Total Bilirubin 0.3  0.3 - 1.2 mg/dL   GFR calc non Af Amer 70 (*) >90 mL/min   GFR calc Af Amer 82 (*) >90 mL/min   Comment:            The eGFR has been calculated     using the CKD EPI equation.     This calculation has not been     validated in all clinical     situations.     eGFR's persistently     <90 mL/min signify     possible Chronic Kidney Disease.  LIPASE, BLOOD     Status: None   Collection Time    02/25/13  6:45 PM      Result Value Range   Lipase 40  11 - 59 U/L  URINALYSIS, ROUTINE W REFLEX MICROSCOPIC     Status: Abnormal   Collection Time    02/25/13  7:18 PM      Result Value Range   Color, Urine YELLOW  YELLOW   APPearance CLEAR  CLEAR   Specific Gravity, Urine 1.024  1.005 - 1.030   pH 5.5  5.0 - 8.0   Glucose, UA NEGATIVE  NEGATIVE mg/dL   Hgb urine dipstick NEGATIVE  NEGATIVE   Bilirubin Urine NEGATIVE  NEGATIVE   Ketones, ur NEGATIVE  NEGATIVE mg/dL   Protein, ur NEGATIVE  NEGATIVE mg/dL   Urobilinogen, UA 1.0  0.0 - 1.0 mg/dL   Nitrite NEGATIVE  NEGATIVE    Leukocytes, UA SMALL (*) NEGATIVE  URINE MICROSCOPIC-ADD ON     Status: None   Collection Time    02/25/13  7:18 PM      Result Value Range   Squamous Epithelial / LPF RARE  RARE   WBC, UA 0-2  <3  WBC/hpf   Bacteria, UA RARE  RARE  OCCULT BLOOD, POC DEVICE     Status: None   Collection Time    02/25/13  7:24 PM      Result Value Range   Fecal Occult Bld NEGATIVE  NEGATIVE    Radiological Exams on Admission: Ct Abdomen Pelvis W Contrast  02/25/2013  *RADIOLOGY REPORT*  Clinical Data: Abdominal pain  CT ABDOMEN AND PELVIS WITH CONTRAST  Technique:  Multidetector CT imaging of the abdomen and pelvis was performed following the standard protocol during bolus administration of intravenous contrast.  Contrast: OMNIPAQUE IOHEXOL 300 MG/ML  SOLN  Comparison: None.  Findings: Minimal curvilinear bilateral lower lobe atelectasis or scarring noted.  6 mm too small to characterize left posterior mid renal cortical hypodensity, image 33.  Liver, gallbladder, spleen, pancreas, adrenal glands, and right kidney are normal.  Moderate atheromatous aortic calcification without aneurysm.  Short segmental sigmoid colonic wall thickening, mesenteric fat stranding, and trace adjacent fluid is identified, for example image 56.  More focal amorphous fluid density adjacent to this segment measuring 2.2 cm image 56 could indicate developing phlegmon or early abscess.  Ovaries are unremarkable.  Uterus presumed surgically absent. Bladder is normal.  Small bowel is unremarkable.  Colonic diverticuli are noted.  No free air, lymphadenopathy, or ascites.  No acute osseous abnormality. Lumbar spine degenerative change identified.  IMPRESSION: Short segmental sigmoid colonic wall thickening, surrounding stranding, and trace pericolonic fluid, most compatible with diverticulitis.  More focal amorphous fluid density adjacent to this segment measuring 2.2 cm image 56 could indicate developing phlegmon or early abscess.  After  symptoms resolve, if the patient has not had screening colonoscopy, this could be considered to exclude underlying malignancy.   Original Report Authenticated By: Christiana Pellant, M.D.     Assessment/Plan Principal Problem:   Diverticulitis Active Problems:   Leukocytosis   HTN (hypertension)   Hypothyroidism   Diverticulitis -Patient appears nontoxic, denies nausea and vomiting. -She is afebrile has a white count of 17.5. CT scan shows a possible phlegmon. -Given the patient appears well and there are no concerns about absorption issues with oral antibiotics given she is not nauseated, I believe we could give her a trial of oral antibiotics and discharge her home. -I have given her the option of staying overnight versus going home. She had initially agreed to stay and later changed her mind. -Patient will be given a prescription for Cipro and Flagyl for 21 days. -She has been instructed to get in contact with her primary care provider next week and arrange for a followup appointment. -She has been advised to return to the emergency department if she develops fever, nausea, vomiting or worsening left lower quadrant abdominal pain.  Time Spent on Consultation: 85 minutes  HERNANDEZ ACOSTA,ESTELA [facility]  [phone] 02/25/2013, 10:28 PM

## 2013-03-02 ENCOUNTER — Encounter: Payer: Self-pay | Admitting: Internal Medicine

## 2013-03-29 ENCOUNTER — Ambulatory Visit (INDEPENDENT_AMBULATORY_CARE_PROVIDER_SITE_OTHER): Payer: Managed Care, Other (non HMO) | Admitting: Internal Medicine

## 2013-03-29 ENCOUNTER — Encounter: Payer: Self-pay | Admitting: Internal Medicine

## 2013-03-29 VITALS — BP 120/70 | HR 70 | Ht 65.25 in | Wt 298.8 lb

## 2013-03-29 DIAGNOSIS — K5732 Diverticulitis of large intestine without perforation or abscess without bleeding: Secondary | ICD-10-CM

## 2013-03-29 DIAGNOSIS — Z1211 Encounter for screening for malignant neoplasm of colon: Secondary | ICD-10-CM

## 2013-03-29 MED ORDER — NA SULFATE-K SULFATE-MG SULF 17.5-3.13-1.6 GM/177ML PO SOLN: ORAL | Status: DC

## 2013-03-29 NOTE — Patient Instructions (Addendum)
You have been scheduled for a colonoscopy with propofol. Please follow written instructions given to you at your visit today.  Please use the suprep kit you have been given today.  I called and canceled the suprep sent to [facility]. If you use inhalers (even only as needed), please bring them with you on the day of your procedure. Your physician has requested that you go to www.startemmi.com and enter the access code given to you at your visit today. This web site gives a general overview about your procedure. However, you should still follow specific instructions given to you by our office regarding your preparation for the procedure.   Today we are giving you a handout to read and follow on diverticulosis along with fiber information.  I appreciate the opportunity to care for you.

## 2013-03-29 NOTE — Progress Notes (Signed)
Subjective:    Patient ID: Sandy Daniels, female    DOB: October 06, 1951, 62 y.o.   MRN: [id number]  HPI This very nice lady is here with her sister. She was diagnosed with sigmoid diverticulitis in the ED in late April. Symptoms were LLQ pain and constipation.  She took 21 days of cipro ane metronidazole and feels recovered - back to work and bowel habits are regular. Minimal LLQ pain/discomfort at times. Has been using MiraLax with success for constipation.  Last colonoscopy 03/2002 by Dr. Virginia Rochester - internal hemorrhoids EGD 2004 - H. Pylori gastritis. GI ROS otherwise negative  Allergies  Allergen Reactions  . Nexium (Esomeprazole Magnesium) Other (See Comments)    Headache  . Penicillins Hives and Swelling  . Betadine (Povidone Iodine) Rash    Severe bumps and blistering  . Neosporin (Neomycin-Bacitracin Zn-Polymyx) Rash    blistering   Outpatient Prescriptions Prior to Visit  Medication Sig Dispense Refill  . levothyroxine (SYNTHROID, LEVOTHROID) 100 MCG tablet Take 100 mcg by mouth daily before breakfast.      . triamterene-hydrochlorothiazide (MAXZIDE-25) 37.5-25 MG per tablet Take 1 tablet by mouth daily.       MiraLax daily                  No facility-administered medications prior to visit.   Past Medical History  Diagnosis Date  . Hypertension   . Hypothyroidism   . OA (osteoarthritis)   . Tendonitis of ankle   . Diverticulitis   . Obesity   . Vitamin D deficiency   . Anxiety   . Goiter   . History of Helicobacter pylori infection 05/25/2003    gastritis at EGD  . Seborrheic dermatitis   . PVC (premature ventricular contraction)   . Diverticulitis large intestine 01/2013   Past Surgical History  Procedure Laterality Date  . Esophagogastroduodenoscopy  05/25/2003    H. Pylori, Dr. Sabino Gasser  . Colonoscopy  03/15/2002    internal hemorrhoids, Dr. Sabino Gasser  . Tonsillectomy    . Vaginal hysterectomy     History   Social History  . Marital Status: Divorced   Spouse Name: N/A    Number of Children: 1       Occupational History  .  [employer]   Social History Main Topics  . Smoking status: Former Smoker    Types: Cigarettes    Quit date: 01/03/2013  . Smokeless tobacco: Never Used  . Alcohol Use: No  . Drug Use: No   Social History Narrative   [employer] - accounting department   Divorced   1 daughter   Family History  Problem Relation Age of Onset  . Dementia Father   . Diabetes Father   . Heart disease Father   . Heart disease Mother   . Diabetes Mother   . Colon cancer Neg Hx         Review of Systems + allergies, arthritis/joint pain All other ROS negative    Objective:   Physical Exam General:  Obese, Well-developed, well-nourished and in no acute distress Eyes:  anicteric. ENT:   Mouth free of lesions.  Neck:   supple w/o thyromegaly or mass.  Lungs: Clear to auscultation bilaterally. Heart:  S1S2, no rubs, murmurs, gallops. Abdomen:  soft, with slight tenderness to deep palpation in LLQ, no hepatosplenomegaly or mass and BS+.  Rectal: deferred Lymph:  no cervical or supraclavicular adenopathy. Extremities:   no edema Neuro:  A&O x 3.  Psych:  anxious   Data Reviewed: 01/2013 CT and ED visit Prior EGD, colonoscopy, pathology 03/02/2013 PCP note        Assessment & Plan:  Diverticulitis of colon (without mention of hemorrhage)   This was dx by CT and appears to be resolved after 21 days of cipro and metronidazole. Slight residual tenderness noted.  Special screening for malignant neoplasms, colon -   Last colonoscopy 2003 - it is an appropriate time for repeating a screening colonoscopy. The risks and benefits as well as alternatives of endoscopic procedure(s) have been discussed and reviewed. All questions answered. The patient agrees to proceed.   I appreciate the opportunity to care for this patient.  XL:KGMWN,UUVOZD DAVIDSON, MD

## 2013-03-30 ENCOUNTER — Encounter: Payer: Self-pay | Admitting: Internal Medicine

## 2013-04-06 ENCOUNTER — Encounter: Payer: Self-pay | Admitting: Internal Medicine

## 2013-06-02 ENCOUNTER — Encounter: Payer: Self-pay | Admitting: Internal Medicine

## 2013-06-02 ENCOUNTER — Ambulatory Visit (AMBULATORY_SURGERY_CENTER): Payer: Managed Care, Other (non HMO) | Admitting: Internal Medicine

## 2013-06-02 VITALS — BP 134/87 | HR 85 | Temp 97.6°F | Resp 16 | Ht 65.25 in | Wt 298.0 lb

## 2013-06-02 DIAGNOSIS — Z1211 Encounter for screening for malignant neoplasm of colon: Secondary | ICD-10-CM

## 2013-06-02 DIAGNOSIS — K5730 Diverticulosis of large intestine without perforation or abscess without bleeding: Secondary | ICD-10-CM

## 2013-06-02 DIAGNOSIS — D126 Benign neoplasm of colon, unspecified: Secondary | ICD-10-CM

## 2013-06-02 DIAGNOSIS — K648 Other hemorrhoids: Secondary | ICD-10-CM

## 2013-06-02 MED ORDER — SODIUM CHLORIDE 0.9 % IV SOLN: 500.0000 mL | INTRAVENOUS | Status: DC

## 2013-06-02 NOTE — Progress Notes (Signed)
Called to room to assist during endoscopic procedure.  Patient ID and intended procedure confirmed with present staff. Received instructions for my participation in the procedure from the performing physician.  

## 2013-06-02 NOTE — Progress Notes (Signed)
Procedure ends, to recovery, rreport given and VSS

## 2013-06-02 NOTE — Op Note (Signed)
[facility] [address] [place], [address]   COLONOSCOPY PROCEDURE REPORT  PATIENT: Sandy, Daniels  MR#: [id number] BIRTHDATE: 1951/04/18 , 62  yrs. old GENDER: Female ENDOSCOPIST: Iva Boop, MD, [facility] REFERRED [id number] Terri Piedra, M.D. PROCEDURE DATE:  06/02/2013 PROCEDURE:   Colonoscopy with biopsy and snare polypectomy First Screening Colonoscopy - Avg.  risk and is 50 yrs.  old or older - No.  Prior Negative Screening - Now for repeat screening. 10 or more years since last screening  History of Adenoma - Now for follow-up colonoscopy & has been > or = to 3 yrs.  N/A  Polyps Removed Today? Yes. ASA CLASS:   Class III INDICATIONS:average risk screening and Last colonoscopy performed 10 years ago. MEDICATIONS: propofol (Diprivan) 200mg  IV, MAC sedation, administered by CRNA, and These medications were titrated to patient response per physician's verbal order  DESCRIPTION OF PROCEDURE:   After the risks benefits and alternatives of the procedure were thoroughly explained, informed consent was obtained.  A digital rectal exam revealed no abnormalities of the rectum.   The [id number]  endoscope was introduced through the anus and advanced to the cecum, which was identified by both the appendix and ileocecal valve. No adverse events experienced.   The quality of the prep was good using Suprep The instrument was then slowly withdrawn as the colon was fully examined.      COLON FINDINGS: Two semi-pedunculated polyps measuring 8 and 12 mm in size were found in the ascending colon.  A polypectomy was performed using snare cautery.  The resection was complete and the polyp tissue was completely retrieved.   Two diminutive sessile polyps were found in the transverse colon.  A polypectomy was performed with a cold snare.  The resection was complete and the polyp tissue was completely retrieved.   Four diminutive sessile polyps were found in the rectum  and sigmoid colon.  A polypectomy was performed with cold forceps.  The resection was complete and the polyp tissue was completely retrieved.   Moderate diverticulosis was noted in the sigmoid colon.   The colon mucosa was otherwise normal.  Retroflexed views revealed internal hemorrhoids. The time to cecum=1 minutes 36 seconds.  Withdrawal time=16 minutes 18 seconds.  The scope was withdrawn and the procedure completed. COMPLICATIONS: There were no complications.         ENDOSCOPIC IMPRESSION: 1.   Two semi-pedunculated polyps measuring 8 and 12 mm in size were found in the ascending colon; polypectomy was performed using snare cautery 2.   Two diminutive sessile polyps were found in the transverse colon; polypectomy was performed with a cold snare 3.   Four diminutive sessile polyps were found in the rectum and sigmoid colon; polypectomy was performed with cold forceps 4.   Moderate diverticulosis was noted in the sigmoid colon 5.   The colon mucosa was otherwise normal - good prep 6.   Internal hemorrhoids in rectum  RECOMMENDATIONS: 1.  Hold aspirin, aspirin products, and anti-inflammatory medication for 2 weeks. 2.  Timing of repeat colonoscopy will be determined by pathology findings.   eSigned:  Iva Boop, MD, [facility] 06/02/2013 12:04 PM   cc: Romero Liner, MD and The Patient   PATIENT NAME:  Sandy, Daniels MR#: [id number]

## 2013-06-02 NOTE — Progress Notes (Addendum)
No egg or soy allergy. ewm No problems with past sedation. ewm Py has gold earrings, several gold colored bracelets, multiple rings-yellow and silver in color and several different colored stones and a watch present as well. ewm

## 2013-06-02 NOTE — Progress Notes (Signed)
Patient did not have preoperative order for IV antibiotic SSI prophylaxis. (G8918)  Patient did not experience any of the following events: a burn prior to discharge; a fall within the facility; wrong site/side/patient/procedure/implant event; or a hospital transfer or hospital admission upon discharge from the facility. (G8907)  

## 2013-06-02 NOTE — Patient Instructions (Addendum)
I found and removed 8 polyps that look benign (no cancer). You have diverticulosis - the diverticulitis is resolved. Also have hemorrhoids.  I will let you know pathology results and when to have another routine colonoscopy by mail.  I appreciate the opportunity to care for you. Iva Boop, MD, [facility]  Hold Aspirin, aspirin products,and anti-inflammatory medications for 2 weeks. Handouts given on colon polyps,hemorrhoids, diverticulosis, and high fiber diet.   YOU HAD AN ENDOSCOPIC PROCEDURE TODAY AT THE [facility]: Refer to the procedure report that was given to you for any specific questions about what was found during the examination.  If the procedure report does not answer your questions, please call your gastroenterologist to clarify.  If you requested that your care partner not be given the details of your procedure findings, then the procedure report has been included in a sealed envelope for you to review at your convenience later.  YOU SHOULD EXPECT: Some feelings of bloating in the abdomen. Passage of more gas than usual.  Walking can help get rid of the air that was put into your GI tract during the procedure and reduce the bloating. If you had a lower endoscopy (such as a colonoscopy or flexible sigmoidoscopy) you may notice spotting of blood in your stool or on the toilet paper. If you underwent a bowel prep for your procedure, then you may not have a normal bowel movement for a few days.  DIET: Your first meal following the procedure should be a light meal and then it is ok to progress to your normal diet.  A half-sandwich or bowl of soup is an example of a good first meal.  Heavy or fried foods are harder to digest and may make you feel nauseous or bloated.  Likewise meals heavy in dairy and vegetables can cause extra gas to form and this can also increase the bloating.  Drink plenty of fluids but you should avoid alcoholic beverages for 24 hours.  ACTIVITY: Your  care partner should take you home directly after the procedure.  You should plan to take it easy, moving slowly for the rest of the day.  You can resume normal activity the day after the procedure however you should NOT DRIVE or use heavy machinery for 24 hours (because of the sedation medicines used during the test).    SYMPTOMS TO REPORT IMMEDIATELY: A gastroenterologist can be reached at any hour.  During normal business hours, 8:30 AM to 5:00 PM Monday through Friday, call [phone].  After hours and on weekends, please call the GI answering service at [phone] who will take a message and have the physician on call contact you.   Following lower endoscopy (colonoscopy or flexible sigmoidoscopy):  Excessive amounts of blood in the stool  Significant tenderness or worsening of abdominal pains  Swelling of the abdomen that is new, acute  Fever of 100F or higher  Following upper endoscopy (EGD)  Vomiting of blood or coffee ground material  New chest pain or pain under the shoulder blades  Painful or persistently difficult swallowing  New shortness of breath  Fever of 100F or higher  Black, tarry-looking stools  FOLLOW UP: If any biopsies were taken you will be contacted by phone or by letter within the next 1-3 weeks.  Call your gastroenterologist if you have not heard about the biopsies in 3 weeks.  Our staff will call the home number listed on your records the next business day following your procedure  to check on you and address any questions or concerns that you may have at that time regarding the information given to you following your procedure. This is a courtesy call and so if there is no answer at the home number and we have not heard from you through the emergency physician on call, we will assume that you have returned to your regular daily activities without incident.  SIGNATURES/CONFIDENTIALITY: You and/or your care partner have signed paperwork which will be  entered into your electronic medical record.  These signatures attest to the fact that that the information above on your After Visit Summary has been reviewed and is understood.  Full responsibility of the confidentiality of this discharge information lies with you and/or your care-partner.

## 2013-06-05 ENCOUNTER — Telehealth: Payer: Self-pay

## 2013-06-05 NOTE — Telephone Encounter (Signed)
Left message on answering machine. 

## 2013-06-12 ENCOUNTER — Encounter: Payer: Self-pay | Admitting: Internal Medicine

## 2013-06-12 DIAGNOSIS — Z860100 Personal history of colon polyps, unspecified: Secondary | ICD-10-CM | POA: Insufficient documentation

## 2013-06-12 HISTORY — DX: Personal history of colonic polyps: Z86.010

## 2013-06-12 HISTORY — DX: Personal history of colon polyps, unspecified: Z86.0100

## 2013-06-12 NOTE — Progress Notes (Signed)
Quick Note:  4 adenomas max 12 mm - anticipate repeat colonoscopy 06/2016 ______

## 2013-09-18 ENCOUNTER — Telehealth: Payer: Self-pay | Admitting: Internal Medicine

## 2013-09-18 NOTE — Telephone Encounter (Signed)
Pt c/o pain on the left side of her stomach. States her stomach just doesn't feel right. Pt scheduled to see Willette Cluster NP Wednesday at 3pm. Pt aware of appt.

## 2013-09-20 ENCOUNTER — Encounter: Payer: Self-pay | Admitting: Nurse Practitioner

## 2013-09-20 ENCOUNTER — Ambulatory Visit (INDEPENDENT_AMBULATORY_CARE_PROVIDER_SITE_OTHER): Payer: Managed Care, Other (non HMO) | Admitting: Nurse Practitioner

## 2013-09-20 VITALS — BP 142/84 | HR 90 | Ht 66.5 in | Wt 303.0 lb

## 2013-09-20 DIAGNOSIS — Z86010 Personal history of colonic polyps: Secondary | ICD-10-CM

## 2013-09-20 DIAGNOSIS — K5900 Constipation, unspecified: Secondary | ICD-10-CM

## 2013-09-20 MED ORDER — LINACLOTIDE 290 MCG PO CAPS: 290.0000 ug | ORAL_CAPSULE | Freq: Every day | ORAL | Status: DC

## 2013-09-20 NOTE — Patient Instructions (Signed)
We have given you Linzess 290 mcg to take one today and one tomorrow then call us Thursday afternoon or Friday morning with an update. CC:  Merri Brunette MD

## 2013-09-20 NOTE — Progress Notes (Signed)
     History of Present Illness:  Patient is a 62 year old female known to Dr. Leone Payor for a history of diverticulitis April 2014. Patent has chronic intermittent constipation and every 3-4 months takes Miralax to move her bowels. Patient unhappy with Miralax because it takes 3 days to work. Last week she tried MOM but it didn't work very well. She feels full of gas, constipated, uncomfortable. She is a little worried about recurrent diverticulitis. No fever.   Current Medications, Allergies, Past Medical History, Past Surgical History, Family History and Social History were reviewed in [employer] record.   Physical Exam: General: Pleasant, obese , black female in no acute distress Head: Normocephalic and atraumatic Eyes:  sclerae anicteric, conjunctiva pink  Ears: Normal auditory acuity Lungs: Clear throughout to auscultation Heart: Regular rate and rhythm Abdomen: Soft, non distended, mild mid lower abdominal tenderness. No masses, no hepatomegaly. Normal bowel sounds Musculoskeletal: Symmetrical with no gross deformities.Moderate left lower anterior ribcage tenderness. Extremities: No edema  Neurological: Alert oriented x 4, grossly nonfocal Psychological:  Alert and cooperative. Normal mood and affect   CTscan  April 2014 IMPRESSION:  Short segmental sigmoid colonic wall thickening, surrounding  stranding, and trace pericolonic fluid, most compatible with  diverticulitis. More focal amorphous fluid density adjacent to  this segment measuring 2.2 cm image 56 could indicate developing  phlegmon or early abscess. After symptoms resolve, if the patient  has not had screening colonoscopy, this could be considered to  exclude underlying malignancy  Assessment and Recommendations:  93. 62 year old female with chronic, intermittent constipation. Miralax takes too many days to work. Tried MOM with sub-optimal results. She is still constipated, has vague lower  abdominal discomfort and is remotely concerned about recurrent diverticulitis. Her abdominal exam is not overly concerning. We first need to get her bowel moving again. Samples of Linzess given, I don't think she will need more than a few doses. Patient will call in a couple of days with a condition update. Further recommendations pending above.   2. History of adenomatous colon polyps Aug. 2014.

## 2013-09-21 NOTE — Progress Notes (Signed)
Agree overall - another option is to take a stimulant laxative She should take Benefiber 2-3 tbsp daily also  Iva Boop, MD, [facility]

## 2013-10-11 ENCOUNTER — Telehealth: Payer: Self-pay | Admitting: Internal Medicine

## 2013-10-11 NOTE — Telephone Encounter (Signed)
Please advise how many you prefer I send in, thank you.

## 2013-10-12 MED ORDER — LINACLOTIDE 290 MCG PO CAPS: 290.0000 ug | ORAL_CAPSULE | Freq: Every day | ORAL | Status: DC

## 2014-10-22 ENCOUNTER — Other Ambulatory Visit: Payer: Self-pay | Admitting: Nurse Practitioner

## 2014-10-23 ENCOUNTER — Other Ambulatory Visit: Payer: Self-pay | Admitting: Nurse Practitioner

## 2014-10-25 ENCOUNTER — Telehealth: Payer: Self-pay | Admitting: *Deleted

## 2014-10-25 MED ORDER — LINACLOTIDE 290 MCG PO CAPS: 290.0000 ug | ORAL_CAPSULE | Freq: Every day | ORAL | Status: DC

## 2014-10-25 NOTE — Telephone Encounter (Signed)
Letter from [place] must fill 90 day supply  Sent as 90 day to see if it will cover medication.

## 2014-11-29 ENCOUNTER — Other Ambulatory Visit: Payer: Self-pay | Admitting: Gynecology

## 2014-11-30 LAB — CYTOLOGY - PAP

## 2015-04-07 ENCOUNTER — Encounter (HOSPITAL_COMMUNITY): Payer: Self-pay | Admitting: Emergency Medicine

## 2015-04-07 ENCOUNTER — Inpatient Hospital Stay (HOSPITAL_COMMUNITY)
Admission: EM | Admit: 2015-04-07 | Discharge: 2015-04-12 | DRG: 872 | Disposition: A | Discharge status: Home / Self Care | Payer: 59 | Attending: Family Medicine | Admitting: Family Medicine

## 2015-04-07 ENCOUNTER — Inpatient Hospital Stay (HOSPITAL_COMMUNITY): Payer: 59

## 2015-04-07 ENCOUNTER — Emergency Department (HOSPITAL_COMMUNITY): Payer: 59

## 2015-04-07 DIAGNOSIS — I959 Hypotension, unspecified: Secondary | ICD-10-CM | POA: Diagnosis present

## 2015-04-07 DIAGNOSIS — M7989 Other specified soft tissue disorders: Secondary | ICD-10-CM

## 2015-04-07 DIAGNOSIS — Z79899 Other long term (current) drug therapy: Secondary | ICD-10-CM

## 2015-04-07 DIAGNOSIS — E039 Hypothyroidism, unspecified: Secondary | ICD-10-CM | POA: Diagnosis present

## 2015-04-07 DIAGNOSIS — E860 Dehydration: Secondary | ICD-10-CM | POA: Diagnosis present

## 2015-04-07 DIAGNOSIS — B379 Candidiasis, unspecified: Secondary | ICD-10-CM | POA: Diagnosis present

## 2015-04-07 DIAGNOSIS — E559 Vitamin D deficiency, unspecified: Secondary | ICD-10-CM | POA: Diagnosis present

## 2015-04-07 DIAGNOSIS — N390 Urinary tract infection, site not specified: Secondary | ICD-10-CM | POA: Diagnosis present

## 2015-04-07 DIAGNOSIS — E861 Hypovolemia: Secondary | ICD-10-CM | POA: Diagnosis present

## 2015-04-07 DIAGNOSIS — F419 Anxiety disorder, unspecified: Secondary | ICD-10-CM | POA: Diagnosis present

## 2015-04-07 DIAGNOSIS — L03115 Cellulitis of right lower limb: Secondary | ICD-10-CM | POA: Diagnosis present

## 2015-04-07 DIAGNOSIS — E119 Type 2 diabetes mellitus without complications: Secondary | ICD-10-CM | POA: Diagnosis present

## 2015-04-07 DIAGNOSIS — Z888 Allergy status to other drugs, medicaments and biological substances status: Secondary | ICD-10-CM

## 2015-04-07 DIAGNOSIS — L0291 Cutaneous abscess, unspecified: Secondary | ICD-10-CM

## 2015-04-07 DIAGNOSIS — M1990 Unspecified osteoarthritis, unspecified site: Secondary | ICD-10-CM | POA: Diagnosis present

## 2015-04-07 DIAGNOSIS — E1165 Type 2 diabetes mellitus with hyperglycemia: Secondary | ICD-10-CM

## 2015-04-07 DIAGNOSIS — E872 Acidosis: Secondary | ICD-10-CM | POA: Diagnosis present

## 2015-04-07 DIAGNOSIS — Z87891 Personal history of nicotine dependence: Secondary | ICD-10-CM

## 2015-04-07 DIAGNOSIS — I10 Essential (primary) hypertension: Secondary | ICD-10-CM | POA: Diagnosis present

## 2015-04-07 DIAGNOSIS — M79609 Pain in unspecified limb: Secondary | ICD-10-CM | POA: Diagnosis not present

## 2015-04-07 DIAGNOSIS — E876 Hypokalemia: Secondary | ICD-10-CM | POA: Diagnosis present

## 2015-04-07 DIAGNOSIS — Z713 Dietary counseling and surveillance: Secondary | ICD-10-CM | POA: Diagnosis present

## 2015-04-07 DIAGNOSIS — Z880 Allergy status to penicillin: Secondary | ICD-10-CM

## 2015-04-07 DIAGNOSIS — Z6841 Body Mass Index (BMI) 40.0 and over, adult: Secondary | ICD-10-CM | POA: Diagnosis not present

## 2015-04-07 DIAGNOSIS — L0390 Cellulitis, unspecified: Secondary | ICD-10-CM | POA: Diagnosis present

## 2015-04-07 DIAGNOSIS — A419 Sepsis, unspecified organism: Principal | ICD-10-CM | POA: Diagnosis present

## 2015-04-07 DIAGNOSIS — M79604 Pain in right leg: Secondary | ICD-10-CM | POA: Diagnosis present

## 2015-04-07 HISTORY — DX: Type 2 diabetes mellitus without complications: E11.9

## 2015-04-07 LAB — CBC WITH DIFFERENTIAL/PLATELET
BASOS ABS: 0 10*3/uL (ref 0.0–0.1)
BASOS PCT: 0 % (ref 0–1)
Eosinophils Absolute: 0 10*3/uL (ref 0.0–0.7)
Eosinophils Relative: 0 % (ref 0–5)
HCT: 48.1 % — ABNORMAL HIGH (ref 36.0–46.0)
HEMOGLOBIN: 16.6 g/dL — AB (ref 12.0–15.0)
LYMPHS ABS: 1.4 10*3/uL (ref 0.7–4.0)
Lymphocytes Relative: 5 % — ABNORMAL LOW (ref 12–46)
MCH: 29.9 pg (ref 26.0–34.0)
MCHC: 34.5 g/dL (ref 30.0–36.0)
MCV: 86.7 fL (ref 78.0–100.0)
MONOS PCT: 4 % (ref 3–12)
Monocytes Absolute: 1.1 10*3/uL — ABNORMAL HIGH (ref 0.1–1.0)
NEUTROS ABS: 25.4 10*3/uL — AB (ref 1.7–7.7)
Neutrophils Relative %: 91 % — ABNORMAL HIGH (ref 43–77)
Platelets: 177 10*3/uL (ref 150–400)
RBC: 5.55 MIL/uL — AB (ref 3.87–5.11)
RDW: 13.9 % (ref 11.5–15.5)
WBC: 27.9 10*3/uL — AB (ref 4.0–10.5)

## 2015-04-07 LAB — COMPREHENSIVE METABOLIC PANEL
ALBUMIN: 3.9 g/dL (ref 3.5–5.0)
ALT: 34 U/L (ref 14–54)
ANION GAP: 20 — AB (ref 5–15)
AST: 45 U/L — ABNORMAL HIGH (ref 15–41)
Alkaline Phosphatase: 111 U/L (ref 38–126)
BILIRUBIN TOTAL: 1.2 mg/dL (ref 0.3–1.2)
BUN: 12 mg/dL (ref 6–20)
CALCIUM: 9.1 mg/dL (ref 8.9–10.3)
CO2: 19 mmol/L — AB (ref 22–32)
Chloride: 97 mmol/L — ABNORMAL LOW (ref 101–111)
Creatinine, Ser: 0.93 mg/dL (ref 0.44–1.00)
GFR calc Af Amer: >60 mL/min (ref >60)
GFR calc non Af Amer: >60 mL/min (ref >60)
GLUCOSE: 169 mg/dL — AB (ref 65–99)
Potassium: 3.3 mmol/L — ABNORMAL LOW (ref 3.5–5.1)
SODIUM: 136 mmol/L (ref 135–145)
TOTAL PROTEIN: 7.8 g/dL (ref 6.5–8.1)

## 2015-04-07 LAB — GLUCOSE, CAPILLARY
Glucose-Capillary: 175 mg/dL — ABNORMAL HIGH (ref 65–99)
Glucose-Capillary: 181 mg/dL — ABNORMAL HIGH (ref 65–99)

## 2015-04-07 LAB — URINALYSIS, ROUTINE W REFLEX MICROSCOPIC
BILIRUBIN URINE: NEGATIVE
Glucose, UA: >1000 mg/dL — AB
Hgb urine dipstick: NEGATIVE
Ketones, ur: >80 mg/dL — AB
NITRITE: NEGATIVE
PROTEIN: NEGATIVE mg/dL
SPECIFIC GRAVITY, URINE: 1.022 (ref 1.005–1.030)
Urobilinogen, UA: 0.2 mg/dL (ref 0.0–1.0)
pH: 5 (ref 5.0–8.0)

## 2015-04-07 LAB — PROCALCITONIN: Procalcitonin: 0.99 ng/mL

## 2015-04-07 LAB — APTT: APTT: 27 s (ref 24–37)

## 2015-04-07 LAB — TSH: TSH: 0.472 u[IU]/mL (ref 0.350–4.500)

## 2015-04-07 LAB — I-STAT CG4 LACTIC ACID, ED: LACTIC ACID, VENOUS: 2.06 mmol/L — AB (ref 0.5–2.0)

## 2015-04-07 LAB — URINE MICROSCOPIC-ADD ON

## 2015-04-07 LAB — PROTIME-INR
INR: 1.04 (ref 0.00–1.49)
Prothrombin Time: 13.8 seconds (ref 11.6–15.2)

## 2015-04-07 LAB — MRSA PCR SCREENING: MRSA BY PCR: NEGATIVE

## 2015-04-07 LAB — CBG MONITORING, ED: Glucose-Capillary: 182 mg/dL — ABNORMAL HIGH (ref 65–99)

## 2015-04-07 MED ORDER — ONDANSETRON HCL 4 MG/2ML IJ SOLN
4.0000 mg | Freq: Four times a day (QID) | INTRAMUSCULAR | Status: DC | PRN
  Administered 2015-04-07: 4 mg via INTRAVENOUS
  Filled 2015-04-07: qty 2

## 2015-04-07 MED ORDER — SODIUM CHLORIDE 0.9 % IV SOLN: INTRAVENOUS | Status: AC

## 2015-04-07 MED ORDER — ACETAMINOPHEN 325 MG PO TABS
650.0000 mg | ORAL_TABLET | Freq: Four times a day (QID) | ORAL | Status: DC | PRN
  Administered 2015-04-07: 650 mg via ORAL
  Filled 2015-04-07: qty 2

## 2015-04-07 MED ORDER — VANCOMYCIN HCL IN DEXTROSE 1-5 GM/200ML-% IV SOLN
1000.0000 mg | Freq: Once | INTRAVENOUS | Status: AC
  Administered 2015-04-07: 1000 mg via INTRAVENOUS
  Filled 2015-04-07: qty 200

## 2015-04-07 MED ORDER — ONDANSETRON HCL 4 MG PO TABS
4.0000 mg | ORAL_TABLET | Freq: Four times a day (QID) | ORAL | Status: DC | PRN
Start: 2015-04-07 — End: 2015-04-12

## 2015-04-07 MED ORDER — HYDROMORPHONE HCL 1 MG/ML IJ SOLN: 1.0000 mg | INTRAMUSCULAR | Status: DC | PRN

## 2015-04-07 MED ORDER — DEXTROSE 5 % IV SOLN
2.0000 g | Freq: Once | INTRAVENOUS | Status: AC
  Administered 2015-04-07: 2 g via INTRAVENOUS
  Filled 2015-04-07: qty 2

## 2015-04-07 MED ORDER — SODIUM CHLORIDE 0.9 % IV BOLUS (SEPSIS)
1000.0000 mL | INTRAVENOUS | Status: AC
  Administered 2015-04-07 (×2): 1000 mL via INTRAVENOUS

## 2015-04-07 MED ORDER — SENNOSIDES-DOCUSATE SODIUM 8.6-50 MG PO TABS: 1.0000 | ORAL_TABLET | Freq: Every evening | ORAL | Status: DC | PRN

## 2015-04-07 MED ORDER — HYDROCODONE-ACETAMINOPHEN 5-325 MG PO TABS
1.0000 | ORAL_TABLET | ORAL | Status: DC | PRN
  Administered 2015-04-07 – 2015-04-08 (×2): 1 via ORAL
  Filled 2015-04-07 (×2): qty 1

## 2015-04-07 MED ORDER — VANCOMYCIN HCL IN DEXTROSE 1-5 GM/200ML-% IV SOLN
1000.0000 mg | Freq: Two times a day (BID) | INTRAVENOUS | Status: DC
  Administered 2015-04-08 – 2015-04-09 (×4): 1000 mg via INTRAVENOUS
  Filled 2015-04-07 (×4): qty 200

## 2015-04-07 MED ORDER — VANCOMYCIN HCL 10 G IV SOLR
1500.0000 mg | Freq: Once | INTRAVENOUS | Status: AC
  Administered 2015-04-07: 1500 mg via INTRAVENOUS
  Filled 2015-04-07: qty 1500

## 2015-04-07 MED ORDER — ACETAMINOPHEN 650 MG RE SUPP: 650.0000 mg | Freq: Four times a day (QID) | RECTAL | Status: DC | PRN

## 2015-04-07 MED ORDER — ONDANSETRON HCL 4 MG/2ML IJ SOLN
4.0000 mg | Freq: Once | INTRAMUSCULAR | Status: AC
  Administered 2015-04-07: 4 mg via INTRAVENOUS
  Filled 2015-04-07: qty 2

## 2015-04-07 MED ORDER — INSULIN ASPART 100 UNIT/ML ~~LOC~~ SOLN
0.0000 [IU] | Freq: Three times a day (TID) | SUBCUTANEOUS | Status: DC
  Administered 2015-04-07 – 2015-04-08 (×4): 2 [IU] via SUBCUTANEOUS
  Administered 2015-04-09: 3 [IU] via SUBCUTANEOUS
  Administered 2015-04-09 – 2015-04-12 (×10): 2 [IU] via SUBCUTANEOUS

## 2015-04-07 MED ORDER — INSULIN ASPART 100 UNIT/ML ~~LOC~~ SOLN: 0.0000 [IU] | Freq: Every day | SUBCUTANEOUS | Status: DC

## 2015-04-07 MED ORDER — HEPARIN SODIUM (PORCINE) 5000 UNIT/ML IJ SOLN
5000.0000 [IU] | Freq: Three times a day (TID) | INTRAMUSCULAR | Status: DC
  Administered 2015-04-07 – 2015-04-12 (×14): 5000 [IU] via SUBCUTANEOUS
  Filled 2015-04-07 (×16): qty 1

## 2015-04-07 MED ORDER — SODIUM CHLORIDE 0.9 % IV SOLN
1000.0000 mL | Freq: Once | INTRAVENOUS | Status: AC
  Administered 2015-04-07: 1000 mL via INTRAVENOUS

## 2015-04-07 MED ORDER — LEVOTHYROXINE SODIUM 100 MCG PO TABS
100.0000 ug | ORAL_TABLET | Freq: Every day | ORAL | Status: DC
  Administered 2015-04-08 – 2015-04-12 (×5): 100 ug via ORAL
  Filled 2015-04-07: qty 2
  Filled 2015-04-07 (×3): qty 1
  Filled 2015-04-07: qty 2

## 2015-04-07 MED ORDER — DEXTROSE 5 % IV SOLN
2.0000 g | Freq: Three times a day (TID) | INTRAVENOUS | Status: DC
  Administered 2015-04-07 – 2015-04-11 (×12): 2 g via INTRAVENOUS
  Filled 2015-04-07 (×14): qty 2

## 2015-04-07 MED ORDER — SODIUM CHLORIDE 0.9 % IV SOLN
INTRAVENOUS | Status: DC
  Administered 2015-04-07 – 2015-04-08 (×2): via INTRAVENOUS

## 2015-04-07 NOTE — ED Notes (Signed)
Vascular US at bedside.

## 2015-04-07 NOTE — ED Notes (Addendum)
Pt reports she began to have R leg pain and emesis that began this am. Has had leg cramps for several days. Leg pain in calf and groin, redness noted on ankle. Has thrown 1x today. Pt has fever in triage. Recent diagnosis of DM.

## 2015-04-07 NOTE — ED Notes (Signed)
Marked area of redness on leg

## 2015-04-07 NOTE — H&P (Signed)
History and Physical        Hospital Admission Note Date: 04/07/2015  Patient name: Sandy Daniels Medical record number: [id number] Date of birth: [birth year] Age: 64 y.o. Gender: female  PCP: Horatio Pel, MD  Referring physician: Dr Tomi Bamberger  Chief Complaint:  Right leg pain with vomiting this morning, fever with chills  HPI: Patient is a 64 year old female with hypertension, hypothyroidism, recently diagnosed with diabetes mellitus 3 days ago presented to ED with right leg pain. Patient reported that she woke up this morning and felt chills and subjective fevers. Subsequently she had an episode of vomiting. She was had noticed her leg cramps for several days. Today when she got up she felt pain in her leg and redness around the ankle which significantly worsened in a few hours. Patient's daughter had noticed a spider yesterday on her clothes. Patient reports that the redness and pain continued to be as worse and she also noticed some discomfort in the right groin. She had fever of 102, felt thirsty and dehydrated. She was also recently diagnosed on Friday with diabetes mellitus by her PCP and was started on invokamet (canagliflozin/metformin). EDP determinations reviewed, ED for workup showed fever of 102.8 with a tachycardia 120-146, tachypnea and 34, hypotension 109/60. CBC revealed WBC of 27.9, hemoglobin 16.6, hematocrit 48.1, potassium 3.3, glucose 169.    Review of Systems:  Constitutional: +  fever, chills, diaphoresis, poor appetite and fatigue.  HEENT: Denies photophobia, eye pain, redness, hearing loss, ear pain, congestion, sore throat, rhinorrhea, sneezing, mouth sores, trouble swallowing, neck pain, neck stiffness and tinnitus.   Respiratory: Denies SOB, DOE, cough, chest tightness,  and wheezing.   Cardiovascular: Denies chest pain, palpitations and leg swelling.    Gastrointestinal: nausea, vomiting, denies abdominal pain, constipation, blood in stool and abdominal distention.  she also had one episode of diarrhea last night which has resolved. Genitourinary: Denies hematuria, flank pain and difficulty urinating.  increased frequency of urination but no dysuria+ Musculoskeletal: Denies myalgias, back pain, joint swelling, arthralgias and gait problem. + pain in the leg as per history of present illness  Skin:Redness around the right ankle, posteriorly to mid leg  Neurological: Denies seizures, syncope, weakness, light-headedness, numbness and headaches. +Generalized weakness with dizziness  Hematological: Denies adenopathy. Easy bruising, personal or family bleeding history  Psychiatric/Behavioral: Denies suicidal ideation, mood changes, confusion, nervousness, sleep disturbance and agitation  Past Medical History: Past Medical History  Diagnosis Date  . Hypertension   . Hypothyroidism   . OA (osteoarthritis)   . Tendonitis of ankle   . Diverticulitis   . Obesity   . Vitamin D deficiency   . Anxiety   . Goiter   . History of Helicobacter pylori infection 05/25/2003    gastritis at EGD  . Seborrheic dermatitis   . PVC (premature ventricular contraction)   . Diverticulitis large intestine 01/2013  . Personal history of colonic adenomas 06/12/2013  . Diabetes mellitus without complication     Past Surgical History  Procedure Laterality Date  . Esophagogastroduodenoscopy  05/25/2003    H. Pylori, Dr. Jim Desanctis  . Colonoscopy  03/15/2002    internal hemorrhoids, Dr. Jim Desanctis  .  Tonsillectomy    . Vaginal hysterectomy      Medications: Prior to Admission medications   Medication Sig Start Date End Date Taking? Authorizing Provider  Canagliflozin-Metformin HCl 50-1000 MG TABS Take 1 tablet by mouth 2 (two) times daily.   Yes Historical Provider, MD  levothyroxine (SYNTHROID, LEVOTHROID) 100 MCG tablet Take 100 mcg by mouth daily before  breakfast.   Yes Historical Provider, MD  Linaclotide (LINZESS) 290 MCG CAPS capsule Take 1 capsule (290 mcg total) by mouth daily. Patient not taking: Reported on 04/07/2015 10/25/14   Gatha Mayer, MD    Allergies:   Allergies  Allergen Reactions  . Nexium [Esomeprazole Magnesium] Other (See Comments)    Headache  . Penicillins Hives and Swelling  . Betadine [Povidone Iodine] Rash    Severe bumps and blistering  . Neosporin [Neomycin-Bacitracin Zn-Polymyx] Rash    blistering    Social History:  reports that she quit smoking last week, a pack lasted about a week. Her smoking use included Cigarettes. She has never used smokeless tobacco. She reports that she does not drink alcohol or use illicit drugs. she lives at home and is functional with her ADLs  Family History: Family History  Problem Relation Age of Onset  . Dementia Father   . Diabetes Father   . Heart disease Father   . Heart disease Mother   . Diabetes Mother   . Colon cancer Neg Hx   . Rectal cancer Neg Hx   . Stomach cancer Neg Hx     Physical Exam: Blood pressure 109/60, pulse 124, temperature 102.8 F (39.3 C), temperature source Oral, resp. rate 20, height 5\' 6"  (1.676 m), weight 127.007 kg (280 lb), SpO2 93 %. General: Alert, awake, oriented x3, in no acute distress. HEENT: normocephalic, atraumatic, anicteric sclera, pink conjunctiva, pupils equal and reactive to light and accomodation, oropharynx clear Neck: supple, no masses or lymphadenopathy, no goiter, no bruits  Heart: Regular rate and rhythm, without murmurs, rubs or gallops. Lungs: Clear to auscultation bilaterally, no wheezing, rales or rhonchi. Abdomen: Soft, nontender, nondistended, positive bowel sounds, no masses. Extremities: No clubbing, cyanosis or edema with positive pedal pulses. Neuro: Grossly intact, no focal neurological deficits, strength 5/5 upper and lower extremities bilaterally Psych: alert and oriented x 3, normal mood and  affect Skin: Erythema from the ankle to the distal medial right calf, with the tenderness, erythema in the right inguinal region also no fluctuation, yeast infection under the left breast   LABS on Admission:  Basic Metabolic Panel:  Recent Labs Lab 04/07/15 1134  NA 136  K 3.3*  CL 97*  CO2 19*  GLUCOSE 169*  BUN 12  CREATININE 0.93  CALCIUM 9.1   Liver Function Tests:  Recent Labs Lab 04/07/15 1134  AST 45*  ALT 34  ALKPHOS 111  BILITOT 1.2  PROT 7.8  ALBUMIN 3.9   No results for input(s): LIPASE, AMYLASE in the last 168 hours. No results for input(s): AMMONIA in the last 168 hours. CBC:  Recent Labs Lab 04/07/15 1134  WBC 27.9*  NEUTROABS 25.4*  HGB 16.6*  HCT 48.1*  MCV 86.7  PLT 177   Cardiac Enzymes: No results for input(s): CKTOTAL, CKMB, CKMBINDEX, TROPONINI in the last 168 hours. BNP: Invalid input(s): POCBNP CBG:  Recent Labs Lab 04/07/15 1114  GLUCAP 182*    Radiological Exams on Admission:  Dg Chest Port 1 View  04/07/2015   CLINICAL DATA:  Nausea and vomiting today.  Tachycardia.  EXAM: PORTABLE CHEST - 1 VIEW  COMPARISON:  None.  FINDINGS: The cardiac silhouette, mediastinal and hilar contours are within normal limits given the AP projection and portable technique. The lungs are clear. No pleural effusion or pneumothorax. The bony thorax is intact. Bilateral cervical ribs are noted.  IMPRESSION: No acute cardiopulmonary findings.   Electronically Signed   By: Marijo Sanes M.D.   On: 04/07/2015 12:59    *I have personally reviewed the images above*  EKG: Independently reviewed. rate 142, sinus tachycardia, LPFB, prolonged QT   Assessment/Plan Principal Problem:   Sepsis with right lower extremity cellulitis, lactic acidosis: The patient meets the sepsis criteria with fever, tachycardia, tachypnea, hypotension, leukocytosis, source of infection being cellulitis also appears to have UTI, lactic acid 2.06, UA with ketones - Admit to  stepdown, placed on sepsis protocol, obtain pro-calcitonin - place on aggressive IV fluid hydration, follow blood cultures, urine cultures - Obtain Doppler ultrasound of the right lower oximetry to rule out DVT - Place on IV vancomycin and aztreonam (penicillin allergy)  Active Problems: Urinary tract infection - Obtain urine culture and sensitivities, placed on IV aztreonam and vancomycin  Diabetes mellitus, recent diagnosis 2 days ago, with DKA: UA positive for ketones and lactic acidosis anion gap 20   - Continue aggressive IV fluid hydration, currently glucose level 169, patient was also recently started on metformin 2 days ago which can cause lactic acidosis - Hold oral hypoglycemics, place on subcutaneous insulin - Obtain hemoglobin A1c, diabetes teaching    Hypothyroidism - Obtain TSH, continue Synthroid     Hypotension due to dehydration, UA positive for ketones, sepsis, hypovolemia -  placed on aggressive IV fluids  DVT prophylaxis: Lovenox  CODE STATUS:  Full code   Family Communication: Admission, patients condition and plan of care including tests being ordered have been discussed with the patient and daughter who indicates understanding and agree with the plan and Code Status  Disposition plan: Further plan will depend as patient's clinical course evolves and further radiologic and laboratory data become available. Likely home when stable  Time Spent on Admission: 60 mins   RAI,RIPUDEEP M.D. [facility] 04/07/2015, 2:57 PM Pager: [phone]  If 7PM-7AM, please contact night-coverage [email or web address] Password [email or web address]

## 2015-04-07 NOTE — ED Provider Notes (Signed)
CSN: [id number]     Arrival date & time 04/07/15  1042 History   First MD Initiated Contact with Patient 04/07/15 1112     Chief Complaint  Patient presents with  . Tachycardia  . Emesis  . Leg Pain   HPI Patient presents to the emergency room with complaints of right leg pain nausea and vomiting.she has noticed some cramps in her legs for a few days. However when she woke up this morning she started having more severepain in her right ankle and she noticed some redness. She also has some discomfort in her right groin. She denies any trouble with abdominal pain. No chest pain or shortness of breath. She does have a fever today up to 102. She feels thirsty and her mouth is dry. Past Medical History  Diagnosis Date  . Hypertension   . Hypothyroidism   . OA (osteoarthritis)   . Tendonitis of ankle   . Diverticulitis   . Obesity   . Vitamin D deficiency   . Anxiety   . Goiter   . History of Helicobacter pylori infection 05/25/2003    gastritis at EGD  . Seborrheic dermatitis   . PVC (premature ventricular contraction)   . Diverticulitis large intestine 01/2013  . Personal history of colonic adenomas 06/12/2013  . Diabetes mellitus without complication    Past Surgical History  Procedure Laterality Date  . Esophagogastroduodenoscopy  05/25/2003    H. Pylori, Dr. Jim Desanctis  . Colonoscopy  03/15/2002    internal hemorrhoids, Dr. Jim Desanctis  . Tonsillectomy    . Vaginal hysterectomy     Family History  Problem Relation Age of Onset  . Dementia Father   . Diabetes Father   . Heart disease Father   . Heart disease Mother   . Diabetes Mother   . Colon cancer Neg Hx   . Rectal cancer Neg Hx   . Stomach cancer Neg Hx    History  Substance Use Topics  . Smoking status: Former Smoker    Types: Cigarettes    Quit date: 01/03/2013  . Smokeless tobacco: Never Used  . Alcohol Use: No   OB History    No data available     Review of Systems  Constitutional: Positive for fever.   Respiratory: Negative for cough and shortness of breath.   Gastrointestinal: Negative for abdominal pain.  Genitourinary: Negative for dysuria.  All other systems reviewed and are negative.     Allergies  Nexium; Penicillins; Betadine; and Neosporin  Home Medications   Prior to Admission medications   Medication Sig Start Date End Date Taking? Authorizing Provider  Canagliflozin-Metformin HCl 50-1000 MG TABS Take 1 tablet by mouth 2 (two) times daily.   Yes Historical Provider, MD  levothyroxine (SYNTHROID, LEVOTHROID) 100 MCG tablet Take 100 mcg by mouth daily before breakfast.   Yes Historical Provider, MD  Linaclotide (LINZESS) 290 MCG CAPS capsule Take 1 capsule (290 mcg total) by mouth daily. Patient not taking: Reported on 04/07/2015 10/25/14   Gatha Mayer, MD   BP 117/66 mmHg  Pulse 130  Temp(Src) 102.8 F (39.3 C) (Oral)  Resp 34  Ht 5\' 6"  (1.676 m)  Wt 280 lb (127.007 kg)  BMI 45.21 kg/m2  SpO2 95% Physical Exam  Constitutional: No distress.  obese  HENT:  Head: Normocephalic and atraumatic.  Right Ear: External ear normal.  Left Ear: External ear normal.  Mouth/Throat: No oropharyngeal exudate.  Eyes: Conjunctivae are normal. Right eye exhibits no discharge.  Left eye exhibits no discharge. No scleral icterus.  Neck: Neck supple. No tracheal deviation present.  Cardiovascular: Normal rate, regular rhythm and intact distal pulses.   Pulmonary/Chest: Effort normal and breath sounds normal. No stridor. No respiratory distress. She has no wheezes. She has no rales.  Abdominal: Soft. Bowel sounds are normal. She exhibits no distension. There is no tenderness. There is no rebound and no guarding.  Musculoskeletal: She exhibits tenderness. She exhibits no edema.  Erythema in the distal medial right calf, tenderness palpation overlying the erythema as well as in the right inguinal region, no fluctuance  Neurological: She is alert. She has normal strength. No cranial  nerve deficit (no facial droop, extraocular movements intact, no slurred speech) or sensory deficit. She exhibits normal muscle tone. She displays no seizure activity. Coordination normal.  Skin: Skin is warm. No rash noted. She is diaphoretic.  Psychiatric: She has a normal mood and affect.  Nursing note and vitals reviewed.   ED Course  Procedures (including critical care time) Labs Review Labs Reviewed  CBC WITH DIFFERENTIAL/PLATELET - Abnormal; Notable for the following:    WBC 27.9 (*)    RBC 5.55 (*)    Hemoglobin 16.6 (*)    HCT 48.1 (*)    Neutrophils Relative % 91 (*)    Lymphocytes Relative 5 (*)    Neutro Abs 25.4 (*)    Monocytes Absolute 1.1 (*)    All other components within normal limits  COMPREHENSIVE METABOLIC PANEL - Abnormal; Notable for the following:    Potassium 3.3 (*)    Chloride 97 (*)    CO2 19 (*)    Glucose, Bld 169 (*)    AST 45 (*)    Anion gap 20 (*)    All other components within normal limits  URINALYSIS, ROUTINE W REFLEX MICROSCOPIC (NOT AT [facility]) - Abnormal; Notable for the following:    Glucose, UA >1000 (*)    Ketones, ur >80 (*)    Leukocytes, UA TRACE (*)    All other components within normal limits  URINE MICROSCOPIC-ADD ON - Abnormal; Notable for the following:    Squamous Epithelial / LPF MANY (*)    Bacteria, UA FEW (*)    All other components within normal limits  CBG MONITORING, ED - Abnormal; Notable for the following:    Glucose-Capillary 182 (*)    All other components within normal limits  I-STAT CG4 LACTIC ACID, ED - Abnormal; Notable for the following:    Lactic Acid, Venous 2.06 (*)    All other components within normal limits  CULTURE, BLOOD (ROUTINE X 2)  CULTURE, BLOOD (ROUTINE X 2)  URINE CULTURE  I-STAT CG4 LACTIC ACID, ED  I-STAT CG4 LACTIC ACID, ED    Imaging Review Dg Chest Port 1 View  04/07/2015   CLINICAL DATA:  Nausea and vomiting today.  Tachycardia.  EXAM: PORTABLE CHEST - 1 VIEW  COMPARISON:  None.   FINDINGS: The cardiac silhouette, mediastinal and hilar contours are within normal limits given the AP projection and portable technique. The lungs are clear. No pleural effusion or pneumothorax. The bony thorax is intact. Bilateral cervical ribs are noted.  IMPRESSION: No acute cardiopulmonary findings.   Electronically Signed   By: Marijo Sanes M.D.   On: 04/07/2015 12:59     EKG Interpretation   Date/Time:  Sunday April 07 2015 11:10:31 EDT Ventricular Rate:  142 PR Interval:  101 QRS Duration: 89 QT Interval:  357 QTC Calculation: 549  R Axis:   125 Text Interpretation:  Sinus tachycardia Left posterior fascicular block  Borderline repolarization abnormality Prolonged QT interval Since last  tracing rate faster Confirmed by Rilla Buckman  MD-J, Nevayah Faust ([id number]) on 04/07/2015  11:13:43 AM     CRITICAL CARE Performed by: CMKLK,JZP Total critical care time: 35 Critical care time was exclusive of separately billable procedures and treating other patients. Critical care was necessary to treat or prevent imminent or life-threatening deterioration. Critical care was time spent personally by me on the following activities: development of treatment plan with patient and/or surrogate as well as nursing, discussions with consultants, evaluation of patient's response to treatment, examination of patient, obtaining history from patient or surrogate, ordering and performing treatments and interventions, ordering and review of laboratory studies, ordering and review of radiographic studies, pulse oximetry and re-evaluation of patient's condition. Medications  acetaminophen (TYLENOL) tablet 650 mg (650 mg Oral Given 04/07/15 1143)  vancomycin (VANCOCIN) IVPB 1000 mg/200 mL premix (1,000 mg Intravenous New Bag/Given 04/07/15 1335)  0.9 %  sodium chloride infusion (0 mLs Intravenous Stopped 04/07/15 1252)  ondansetron (ZOFRAN) injection 4 mg (4 mg Intravenous Given 04/07/15 1143)  aztreonam (AZACTAM) 2 g in dextrose 5 % 50  mL IVPB (0 g Intravenous Stopped 04/07/15 1321)    MDM   Final diagnoses:  Cellulitis of right lower extremity    Pt presents with chills, fever and erythema on her leg consistent with cellulitis.  Lactic acid and WBC are elevated.  Will give 30 cc /kg bolus.  WIll need to monitor closely for hypotension , evidence of end organ damage/sepsis.  Plan on admission.  Send off doppler study to evaluate for dvt    Dorie Rank, MD 04/08/15 [phone]

## 2015-04-07 NOTE — Progress Notes (Signed)
ANTIBIOTIC CONSULT NOTE - INITIAL  Pharmacy Consult for Vancomycin, Aztreonam Indication: Cellulitis w/ sepsis  Allergies  Allergen Reactions  . Nexium [Esomeprazole Magnesium] Other (See Comments)    Headache  . Penicillins Hives and Swelling  . Betadine [Povidone Iodine] Rash    Severe bumps and blistering  . Neosporin [Neomycin-Bacitracin Zn-Polymyx] Rash    blistering    Patient Measurements: Height: 5\' 6"  (167.6 cm) Weight: 280 lb (127.007 kg) IBW/kg (Calculated) : 59.3  Vital Signs: Temp: 99.7 F (37.6 C) (06/05 1506) Temp Source: Oral (06/05 1506) BP: 107/70 mmHg (06/05 1500) Pulse Rate: 117 (06/05 1500) Intake/Output from previous day:   Intake/Output from this shift:    Labs:  Recent Labs  04/07/15 1134  WBC 27.9*  HGB 16.6*  PLT 177  CREATININE 0.93   Estimated Creatinine Clearance: 82.3 mL/min (by C-G formula based on Cr of 0.93). No results for input(s): VANCOTROUGH, VANCOPEAK, VANCORANDOM, GENTTROUGH, GENTPEAK, GENTRANDOM, TOBRATROUGH, TOBRAPEAK, TOBRARND, AMIKACINPEAK, AMIKACINTROU, AMIKACIN in the last 72 hours.   Microbiology: No results found for this or any previous visit (from the past 720 hour(s)).  Medical History: Past Medical History  Diagnosis Date  . Hypertension   . Hypothyroidism   . OA (osteoarthritis)   . Tendonitis of ankle   . Diverticulitis   . Obesity   . Vitamin D deficiency   . Anxiety   . Goiter   . History of Helicobacter pylori infection 05/25/2003    gastritis at EGD  . Seborrheic dermatitis   . PVC (premature ventricular contraction)   . Diverticulitis large intestine 01/2013  . Personal history of colonic adenomas 06/12/2013  . Diabetes mellitus without complication     Medications:  Anti-infectives    Start     Dose/Rate Route Frequency Ordered Stop   04/07/15 1530  vancomycin (VANCOCIN) 1,500 mg in sodium chloride 0.9 % 500 mL IVPB     1,500 mg 250 mL/hr over 120 Minutes Intravenous  Once 04/07/15 1448      04/07/15 1145  aztreonam (AZACTAM) 2 g in dextrose 5 % 50 mL IVPB     2 g 100 mL/hr over 30 Minutes Intravenous  Once 04/07/15 1133 04/07/15 1321   04/07/15 1145  vancomycin (VANCOCIN) IVPB 1000 mg/200 mL premix     1,000 mg 200 mL/hr over 60 Minutes Intravenous  Once 04/07/15 1133 04/07/15 1435     Assessment: 64 y.o. obese female with PMH hypothyroid, HTN, DMT2 presented to ED with right leg pain. Woke up this morning and felt chills, subjective fevers, with subsequent vomiting.  Endorses leg cramps for several days. Today when she got up she felt pain in her leg and redness around the ankle which significantly worsened in a few hours. Patient's daughter had noticed a spider yesterday on her clothes. Patient reports that the redness and pain continued to worsen and she also noticed some discomfort in the right groin.  Admitting for sepsis from cellulitis and possible UTI.  Received aztreonam 2g and vancomycin 1000 mg in ED x 1  6/5 >> vancomycin >> 6/5 >> aztreonam >>    Tmax: 102.8 WBCs: markedly elevated Renal: SCr wnl; CrCl 82; 68 N PCT 0.99 LA sl elevated  6/5 blood: IP 6/5 urine: IP   Goal of Therapy:  Vancomycin trough level 15-20 mcg/ml  Eradication of infection Appropriate antibiotic dosing for indication and renal function  Plan:  Day 1 antibiotics Vancomycin 1500 mg IV now (total load 2500 mg), then 1000 mg IV q12 hr  Measure vancomycin trough levels at steady state as indicated Aztreonam 2g IV q8 hr  Follow clinical course, renal function, culture results as available  Follow for de-escalation of antibiotics and LOT   Reuel Boom, PharmD Pager: [phone] 04/07/2015, 4:08 PM

## 2015-04-07 NOTE — Progress Notes (Signed)
VASCULAR LAB PRELIMINARY  PRELIMINARY  PRELIMINARY  PRELIMINARY  Right lower extremity venous duplex completed.    Preliminary report:  Right:  No evidence of DVT, superficial thrombosis, or Baker's cyst.  Skyelar Halliday, RVT 04/07/2015, 3:02 PM

## 2015-04-08 ENCOUNTER — Inpatient Hospital Stay (HOSPITAL_COMMUNITY): Payer: 59

## 2015-04-08 ENCOUNTER — Encounter (HOSPITAL_COMMUNITY): Payer: Self-pay | Admitting: Radiology

## 2015-04-08 LAB — CBC
HCT: 42.6 % (ref 36.0–46.0)
HEMOGLOBIN: 14.1 g/dL (ref 12.0–15.0)
MCH: 29.5 pg (ref 26.0–34.0)
MCHC: 33.1 g/dL (ref 30.0–36.0)
MCV: 89.1 fL (ref 78.0–100.0)
Platelets: 173 10*3/uL (ref 150–400)
RBC: 4.78 MIL/uL (ref 3.87–5.11)
RDW: 14.3 % (ref 11.5–15.5)
WBC: 25.1 10*3/uL — ABNORMAL HIGH (ref 4.0–10.5)

## 2015-04-08 LAB — BASIC METABOLIC PANEL
Anion gap: 11 (ref 5–15)
BUN: 9 mg/dL (ref 6–20)
CO2: 22 mmol/L (ref 22–32)
Calcium: 8.2 mg/dL — ABNORMAL LOW (ref 8.9–10.3)
Chloride: 105 mmol/L (ref 101–111)
Creatinine, Ser: 0.8 mg/dL (ref 0.44–1.00)
GFR calc Af Amer: >60 mL/min (ref >60)
Glucose, Bld: 159 mg/dL — ABNORMAL HIGH (ref 65–99)
Potassium: 3.3 mmol/L — ABNORMAL LOW (ref 3.5–5.1)
Sodium: 138 mmol/L (ref 135–145)

## 2015-04-08 LAB — GLUCOSE, CAPILLARY
GLUCOSE-CAPILLARY: 175 mg/dL — AB (ref 65–99)
GLUCOSE-CAPILLARY: 186 mg/dL — AB (ref 65–99)
Glucose-Capillary: 152 mg/dL — ABNORMAL HIGH (ref 65–99)

## 2015-04-08 LAB — URINE CULTURE: Colony Count: 2000

## 2015-04-08 MED ORDER — LIVING WELL WITH DIABETES BOOK
Freq: Once | Status: AC
  Administered 2015-04-08: 12:00:00
  Filled 2015-04-08: qty 1

## 2015-04-08 MED ORDER — NYSTATIN 100000 UNIT/GM EX POWD
Freq: Two times a day (BID) | CUTANEOUS | Status: DC
  Administered 2015-04-08 – 2015-04-12 (×9): via TOPICAL
  Filled 2015-04-08 (×2): qty 15

## 2015-04-08 MED ORDER — IOHEXOL 300 MG/ML  SOLN
100.0000 mL | Freq: Once | INTRAMUSCULAR | Status: AC | PRN
  Administered 2015-04-08: 100 mL via INTRAVENOUS

## 2015-04-08 MED ORDER — HYDROCODONE-ACETAMINOPHEN 5-325 MG PO TABS
1.0000 | ORAL_TABLET | ORAL | Status: DC | PRN
  Administered 2015-04-08 – 2015-04-10 (×9): 1 via ORAL
  Filled 2015-04-08 (×9): qty 1

## 2015-04-08 MED ORDER — INSULIN ASPART 100 UNIT/ML ~~LOC~~ SOLN
3.0000 [IU] | Freq: Three times a day (TID) | SUBCUTANEOUS | Status: DC
  Administered 2015-04-08 – 2015-04-12 (×14): 3 [IU] via SUBCUTANEOUS

## 2015-04-08 MED ORDER — POTASSIUM CHLORIDE CRYS ER 20 MEQ PO TBCR
40.0000 meq | EXTENDED_RELEASE_TABLET | Freq: Once | ORAL | Status: AC
  Administered 2015-04-08: 40 meq via ORAL
  Filled 2015-04-08: qty 2

## 2015-04-08 NOTE — Progress Notes (Signed)
Date:  April 08, 2015 U.R. performed for needs and level of care. Will continue to follow for Case Management needs.  Rhonda Davis, RN, BSN, CCM   [phone] 

## 2015-04-08 NOTE — Care Management Note (Signed)
Case Management Note  Patient Details  Name: HADIYAH MARICLE MRN: [id number] Date of Birth: 1951/07/22  Subjective/Objective:                 Sepsis and dka   Action/Plan:   Expected Discharge Date:       [id number]          Expected Discharge Plan:  Home/Self Care  In-House Referral:  NA  Discharge planning Services  CM Consult  Post Acute Care Choice:  NA Choice offered to:  NA  DME Arranged:  N/A DME Agency:  NA  HH Arranged:  NA HH Agency:  NA  Status of Service:  In process, will continue to follow  Medicare Important Message Given:    Date Medicare IM Given:    Medicare IM give by:    Date Additional Medicare IM Given:    Additional Medicare Important Message give by:     If discussed at Powers of Stay Meetings, dates discussed:    Additional Comments:  Leeroy Cha, RN 04/08/2015, 10:21 AM

## 2015-04-08 NOTE — Progress Notes (Signed)
NUTRITION NOTE   RD consulted for nutrition education regarding diabetes.   No results found for: HGBA1C  RD provided "Carbohydrate Counting for People with Diabetes" handout from the Academy of [employer] and Dietetics as well as 1800 Carbohydrate Controlled Diet from Nutrition 411 website. Discussed different food groups and their effects on blood sugar, emphasizing carbohydrate-containing foods. Provided list of carbohydrates and recommended serving sizes of common foods.  Discussed importance of controlled and consistent carbohydrate intake throughout the day. Provided examples of ways to balance meals/snacks and encouraged intake of high-fiber, whole grain complex carbohydrates. Teach back method used.   Pt reports that she was dx with DM on Friday (04/05/15) and that she is to follow-up with care providers regarding this new dx on 04/16/15. She states that her PCP provided her with education materials when she was diagnosed and that she has also received some other materials here in the hospital. Pt does not have questions at this time but states she will let someone know if she does so RD can re-visit.  Expect fair compliance given new dx and need for learning to continue.  Body mass index is 47.24 kg/(m^2). Pt meets criteria for morbid obesity based on current BMI.  Current diet order is Carb Modified, patient is consuming approximately 95% of breakfast this AM with no other intakes documented. Labs and medications reviewed. No further nutrition interventions warranted at this time. RD contact information provided. If additional nutrition issues arise, please re-consult RD.    Jarome Matin, RD, LDN Inpatient Clinical Dietitian Pager # [phone] After hours/weekend pager # [phone]

## 2015-04-08 NOTE — Progress Notes (Signed)
Inpatient Diabetes Program Recommendations  AACE/ADA: New Consensus Statement on Inpatient Glycemic Control (2013)  Target Ranges:  Prepandial:   less than 140 mg/dL      Peak postprandial:   less than 180 mg/dL (1-2 hours)      Critically ill patients:  140 - 180 mg/dL   Reason for Visit: Diabetes Consult  Diabetes history: Newly-diagnosed DM2 - 3days ago Outpatient Diabetes medications: Caniglifozin/metformin 50/1000 mg bid Current orders for Inpatient glycemic control: Novolog sensitive tidwc and hs + 3 units tidwc  64 year old female with hypertension, hypothyroidism, recently diagnosed with DM 3 days ago presented to ED with right leg pain. Found to be septic and in DKA with AG of 20.  Results for KERIANNE, GURR (MRN [id number]) as of 04/08/2015 11:50  Ref. Range 04/07/2015 11:34 04/07/2015 11:44 04/08/2015 03:47  Sodium Latest Ref Range: 135-145 mmol/L 136  138  Potassium Latest Ref Range: 3.5-5.1 mmol/L 3.3 (L)  3.3 (L)  Chloride Latest Ref Range: 101-111 mmol/L 97 (L)  105  CO2 Latest Ref Range: 22-32 mmol/L 19 (L)  22  BUN Latest Ref Range: 6-20 mg/dL 12  9  Creatinine Latest Ref Range: 0.44-1.00 mg/dL 0.93  0.80  Calcium Latest Ref Range: 8.9-10.3 mg/dL 9.1  8.2 (L)  EGFR (Non-African Amer.) Latest Ref Range: >60 mL/min >60  >60  EGFR (African American) Latest Ref Range: >60 mL/min >60  >60  Glucose Latest Ref Range: 65-99 mg/dL 169 (H)  159 (H)  Anion gap Latest Ref Range: 5-15  20 (H)  11       Newly-diagnosed DM. In CT at present. Will order Living Well With Diabetes book and OP Diabetes Education consult. Hgba1C pending. Will speak with pt this afternoon regarding diagnosis of DM. Has PCP and will need to f/u with them at discharge for diabetes management.  Thank you. Lorenda Peck, RD, LDN, CDE Inpatient Diabetes Coordinator [phone]

## 2015-04-08 NOTE — Progress Notes (Signed)
[facility]                                                                              Patient Demographics  Sandy Daniels, is a 64 y.o. female, DOB - 1951/01/29, QIO:[id number]  Admit date - 04/07/2015   Admitting Physician Ripudeep Krystal Eaton, MD  Outpatient Primary MD for the patient is Horatio Pel, MD  LOS - 1   Chief Complaint  Patient presents with  . Tachycardia  . Emesis  . Leg Pain       Brief HPI   Patient is a 64 year old female with hypertension, hypothyroidism, recently diagnosed with diabetes mellitus 3 days ago presented to ED with right leg pain. Patient reported that she woke up this morning and felt chills and subjective fevers. Subsequently she had an episode of vomiting. She was had noticed her leg cramps for several days. Today when she got up she felt pain in her leg and redness around the ankle which significantly worsened in a few hours. Patient's daughter had noticed a spider yesterday on her clothes. Patient reports that the redness and pain continued to be as worse and she also noticed some discomfort in the right groin. She had fever of 102, felt thirsty and dehydrated. She was also recently diagnosed on Friday with diabetes mellitus by her PCP and was started on invokamet (canagliflozin/metformin). EDP determinations reviewed, ED for workup showed fever of 102.8 with a tachycardia 120-146, tachypnea and 34, hypotension 109/60. CBC revealed WBC of 27.9, hemoglobin 16.6, hematocrit 48.1, potassium 3.3, glucose 169.     Assessment & Plan    Principal Problem:  Sepsis with right lower extremity cellulitis, lactic acidosis: The patient met the sepsis criteria with fever, tachycardia, tachypnea, hypotension, leukocytosis, source of infection being cellulitis, UTI, lactic acid 2.06, UA with ketones - Cellulitis appeared to be worsening hence CT of the right tibia-fibula done, showed mild cellulitis, no abscess, myositis or  fasciitis. -Continue IV fluid hydration, follow blood cultures, urine cultures - Doppler ultrasound of the right approximately negative for DVT - Place on IV vancomycin and aztreonam (penicillin allergy)  Active Problems: Urinary tract infection - Follow urine culture and sensitivities, placed on IV aztreonam and vancomycin  Diabetes mellitus, recent diagnosis, uncontrolled with DKA: UA positive for ketones and lactic acidosis anion gap 20on admission - Continue IV fluid hydration, gap 11,  Hold oral hypoglycemics, place on subcutaneous insulin - Obtain hemoglobin A1c, diabetes teaching   Hypothyroidism - TSH 0.47  continue Synthroid    Hypotension due to dehydration, UA positive for ketones, sepsis, hypovolemia - BP improving  Hypokalemia Replaced  Yeast infection under the breast Placed on nystatin powder  Code Status: *Full code  Family Communication: Discussed in detail with the patient, all imaging results, lab results explained to the patient and daughter at the bedside   Disposition Plan: Continue step down today  Time Spent in minutes   25 minutes  Procedures  Doppler lower extremity  Consults   None  DVT Prophylaxis  heparin subcutaneous  Medications  Scheduled Meds: . aztreonam  2 g Intravenous Q8H  . heparin  5,000 Units Subcutaneous 3  times per day  . insulin aspart  0-5 Units Subcutaneous QHS  . insulin aspart  0-9 Units Subcutaneous TID WC  . insulin aspart  3 Units Subcutaneous TID WC  . levothyroxine  100 mcg Oral QAC breakfast  . nystatin   Topical BID  . potassium chloride  40 mEq Oral Once  . vancomycin  1,000 mg Intravenous Q12H   Continuous Infusions: . sodium chloride 75 mL/hr at 04/08/15 0715   PRN Meds:.acetaminophen **OR** acetaminophen, HYDROcodone-acetaminophen, HYDROmorphone (DILAUDID) injection, ondansetron **OR** ondansetron (ZOFRAN) IV, senna-docusate   Antibiotics   Anti-infectives    Start     Dose/Rate Route  Frequency Ordered Stop   04/08/15 0600  vancomycin (VANCOCIN) IVPB 1000 mg/200 mL premix     1,000 mg 200 mL/hr over 60 Minutes Intravenous Every 12 hours 04/07/15 1626     04/07/15 2000  aztreonam (AZACTAM) 2 g in dextrose 5 % 50 mL IVPB     2 g 100 mL/hr over 30 Minutes Intravenous Every 8 hours 04/07/15 1626     04/07/15 1530  vancomycin (VANCOCIN) 1,500 mg in sodium chloride 0.9 % 500 mL IVPB     1,500 mg 250 mL/hr over 120 Minutes Intravenous  Once 04/07/15 1448 04/07/15 1851   04/07/15 1145  aztreonam (AZACTAM) 2 g in dextrose 5 % 50 mL IVPB     2 g 100 mL/hr over 30 Minutes Intravenous  Once 04/07/15 1133 04/07/15 1321   04/07/15 1145  vancomycin (VANCOCIN) IVPB 1000 mg/200 mL premix     1,000 mg 200 mL/hr over 60 Minutes Intravenous  Once 04/07/15 1133 04/07/15 1435        Subjective:   Sandy Daniels was seen and examined today. Feeling a lot better today overall, afebrile although cellulitis appears to be worsening. Patient denies dizziness, chest pain, shortness of breath, abdominal pain, N/V/D/C, new weakness, numbess, tingling. No acute events overnight.    Objective:   Blood pressure 146/81, pulse 107, temperature 98.4 F (36.9 C), temperature source Oral, resp. rate 11, height '5\' 6"'  (1.676 m), weight 132.7 kg (292 lb 8.8 oz), SpO2 98 %.  Wt Readings from Last 3 Encounters:  04/08/15 132.7 kg (292 lb 8.8 oz)  09/20/13 137.44 kg (303 lb)  06/02/13 135.172 kg (298 lb)     Intake/Output Summary (Last 24 hours) at 04/08/15 1120 Last data filed at 04/08/15 1030  Gross per 24 hour  Intake   3435 ml  Output   1300 ml  Net   2135 ml    Exam  General: Alert and oriented x 3, NAD  HEENT:  PERRLA, EOMI, Anicteric Sclera, mucous membranes moist.   Neck: Supple, no JVD, no masses  CVS: S1 S2 auscultated, no rubs, murmurs or gallops. Regular rate and rhythm.  Respiratory: Clear to auscultation bilaterally, no wheezing, rales or rhonchi  Abdomen: Soft, nontender,  nondistended, + bowel sounds  Ext: no cyanosis clubbing or edema  Neuro: AAOx3, Cr N's II- XII. Strength 5/5 upper and lower extremities bilaterally  Skin: Right lower extremity cellulitis  Psych: Normal affect and demeanor, alert and oriented x3    Data Review   Micro Results Recent Results (from the past 240 hour(s))  MRSA PCR Screening     Status: None   Collection Time: 04/07/15 10:59 AM  Result Value Ref Range Status   MRSA by PCR NEGATIVE NEGATIVE Final    Comment:        The GeneXpert MRSA Assay (FDA approved for NASAL specimens  only), is one component of a comprehensive MRSA colonization surveillance program. It is not intended to diagnose MRSA infection nor to guide or monitor treatment for MRSA infections.   Blood Culture (routine x 2)     Status: None (Preliminary result)   Collection Time: 04/07/15 11:30 AM  Result Value Ref Range Status   Specimen Description RIGHT ANTECUBITAL  Final   Special Requests BOTTLES DRAWN AEROBIC AND ANAEROBIC 5CC  Final   Culture   Final           BLOOD CULTURE RECEIVED NO GROWTH TO DATE CULTURE WILL BE HELD FOR 5 DAYS BEFORE ISSUING A FINAL NEGATIVE REPORT Performed at [facility]    Report Status PENDING  Incomplete  Blood Culture (routine x 2)     Status: None (Preliminary result)   Collection Time: 04/07/15 12:11 PM  Result Value Ref Range Status   Specimen Description BLOOD LEFT HAND  Final   Special Requests BOTTLES DRAWN AEROBIC AND ANAEROBIC 10 CC EACH  Final   Culture   Final           BLOOD CULTURE RECEIVED NO GROWTH TO DATE CULTURE WILL BE HELD FOR 5 DAYS BEFORE ISSUING A FINAL NEGATIVE REPORT Performed at [facility]    Report Status PENDING  Incomplete    Radiology Reports Ct Tibia Fibula Right W Contrast  04/08/2015   CLINICAL DATA:  Redness and swelling and cellulitis of the right lower leg since 04/07/2015.  EXAM: CT OF THE RIGHT TIBIA AND FIBULA WITH CONTRAST  TECHNIQUE: Axial images were  obtained through the right lower leg. New sagittal and coronal reformatted images were then created.  CONTRAST:  185m OMNIPAQUE IOHEXOL 300 MG/ML  SOLN  COMPARISON:  Radiographs dated 07/14/2009  FINDINGS: There is slight subcutaneous edema circumferentially in the distal right lower leg. There is no abscess, myositis, fasciitis, or osteomyelitis. There is no ankle joint effusion. Physiologic amount of fluid in the knee joint.  IMPRESSION: Slight subcutaneous edema in the right lower leg, nonspecific. This could represent mild cellulitis.   Electronically Signed   By: JLorriane ShireM.D.   On: 04/08/2015 08:58   Dg Chest Port 1 View  04/07/2015   CLINICAL DATA:  Nausea and vomiting today.  Tachycardia.  EXAM: PORTABLE CHEST - 1 VIEW  COMPARISON:  None.  FINDINGS: The cardiac silhouette, mediastinal and hilar contours are within normal limits given the AP projection and portable technique. The lungs are clear. No pleural effusion or pneumothorax. The bony thorax is intact. Bilateral cervical ribs are noted.  IMPRESSION: No acute cardiopulmonary findings.   Electronically Signed   By: PMarijo SanesM.D.   On: 04/07/2015 12:59    CBC  Recent Labs Lab 04/07/15 1134 04/08/15 0347  WBC 27.9* 25.1*  HGB 16.6* 14.1  HCT 48.1* 42.6  PLT 177 173  MCV 86.7 89.1  MCH 29.9 29.5  MCHC 34.5 33.1  RDW 13.9 14.3  LYMPHSABS 1.4  --   MONOABS 1.1*  --   EOSABS 0.0  --   BASOSABS 0.0  --     Chemistries   Recent Labs Lab 04/07/15 1134 04/08/15 0347  NA 136 138  K 3.3* 3.3*  CL 97* 105  CO2 19* 22  GLUCOSE 169* 159*  BUN 12 9  CREATININE 0.93 0.80  CALCIUM 9.1 8.2*  AST 45*  --   ALT 34  --   ALKPHOS 111  --   BILITOT 1.2  --    ------------------------------------------------------------------------------------------------------------------  estimated creatinine clearance is 98.2 mL/min (by C-G formula based on Cr of  0.8). ------------------------------------------------------------------------------------------------------------------ No results for input(s): HGBA1C in the last 72 hours. ------------------------------------------------------------------------------------------------------------------ No results for input(s): CHOL, HDL, LDLCALC, TRIG, CHOLHDL, LDLDIRECT in the last 72 hours. ------------------------------------------------------------------------------------------------------------------  Recent Labs  04/07/15 1134  TSH 0.472   ------------------------------------------------------------------------------------------------------------------ No results for input(s): VITAMINB12, FOLATE, FERRITIN, TIBC, IRON, RETICCTPCT in the last 72 hours.  Coagulation profile  Recent Labs Lab 04/07/15 1134  INR 1.04    No results for input(s): DDIMER in the last 72 hours.  Cardiac Enzymes No results for input(s): CKMB, TROPONINI, MYOGLOBIN in the last 168 hours.  Invalid input(s): CK ------------------------------------------------------------------------------------------------------------------ Invalid input(s): POCBNP   Recent Labs  04/07/15 1114 04/07/15 1649 04/07/15 2129 04/08/15 0725  GLUCAP 182* 175* 181* 175*     RAI,RIPUDEEP M.D. [facility] 04/08/2015, 11:20 AM  Pager: [phone]   Between 7am to 7pm - call Pager - [phone]  After 7pm go to [email or web address] - password [email or web address]  Call night coverage person covering after 7pm

## 2015-04-09 ENCOUNTER — Telehealth: Payer: Self-pay | Admitting: Cardiology

## 2015-04-09 LAB — GLUCOSE, CAPILLARY
GLUCOSE-CAPILLARY: 198 mg/dL — AB (ref 65–99)
Glucose-Capillary: 156 mg/dL — ABNORMAL HIGH (ref 65–99)
Glucose-Capillary: 164 mg/dL — ABNORMAL HIGH (ref 65–99)
Glucose-Capillary: 205 mg/dL — ABNORMAL HIGH (ref 65–99)
Glucose-Capillary: 194 mg/dL — ABNORMAL HIGH (ref 65–99)

## 2015-04-09 LAB — BASIC METABOLIC PANEL
Anion gap: 10 (ref 5–15)
BUN: 5 mg/dL — ABNORMAL LOW (ref 6–20)
CHLORIDE: 104 mmol/L (ref 101–111)
CO2: 23 mmol/L (ref 22–32)
Calcium: 8.4 mg/dL — ABNORMAL LOW (ref 8.9–10.3)
Creatinine, Ser: 0.7 mg/dL (ref 0.44–1.00)
GFR calc Af Amer: >60 mL/min (ref >60)
GFR calc non Af Amer: >60 mL/min (ref >60)
Glucose, Bld: 171 mg/dL — ABNORMAL HIGH (ref 65–99)
Potassium: 3.4 mmol/L — ABNORMAL LOW (ref 3.5–5.1)
Sodium: 137 mmol/L (ref 135–145)

## 2015-04-09 LAB — URINE CULTURE: Colony Count: 45000

## 2015-04-09 LAB — VANCOMYCIN, TROUGH: Vancomycin Tr: 8 ug/mL — ABNORMAL LOW (ref 10.0–20.0)

## 2015-04-09 LAB — CBC
HCT: 41.8 % (ref 36.0–46.0)
HEMOGLOBIN: 13.5 g/dL (ref 12.0–15.0)
MCH: 28.4 pg (ref 26.0–34.0)
MCHC: 32.3 g/dL (ref 30.0–36.0)
MCV: 87.8 fL (ref 78.0–100.0)
Platelets: 167 10*3/uL (ref 150–400)
RBC: 4.76 MIL/uL (ref 3.87–5.11)
RDW: 14.4 % (ref 11.5–15.5)
WBC: 15.4 10*3/uL — ABNORMAL HIGH (ref 4.0–10.5)

## 2015-04-09 LAB — HEMOGLOBIN A1C
HEMOGLOBIN A1C: 14.8 % — AB (ref 4.8–5.6)
MEAN PLASMA GLUCOSE: 378 mg/dL

## 2015-04-09 MED ORDER — INSULIN STARTER KIT- PEN NEEDLES (ENGLISH)
1.0000 | Freq: Once | Status: AC
  Administered 2015-04-09: 1
  Filled 2015-04-09: qty 1

## 2015-04-09 MED ORDER — FUROSEMIDE 10 MG/ML IJ SOLN
20.0000 mg | Freq: Once | INTRAMUSCULAR | Status: AC
  Administered 2015-04-09: 20 mg via INTRAVENOUS
  Filled 2015-04-09: qty 2

## 2015-04-09 MED ORDER — DIPHENHYDRAMINE HCL 25 MG PO CAPS
25.0000 mg | ORAL_CAPSULE | Freq: Once | ORAL | Status: AC
  Administered 2015-04-09: 25 mg via ORAL
  Filled 2015-04-09: qty 1

## 2015-04-09 MED ORDER — VANCOMYCIN HCL 500 MG IV SOLR
500.0000 mg | Freq: Once | INTRAVENOUS | Status: AC
  Administered 2015-04-09: 500 mg via INTRAVENOUS
  Filled 2015-04-09: qty 500

## 2015-04-09 MED ORDER — POTASSIUM CHLORIDE CRYS ER 20 MEQ PO TBCR
40.0000 meq | EXTENDED_RELEASE_TABLET | Freq: Once | ORAL | Status: AC
  Administered 2015-04-09: 40 meq via ORAL
  Filled 2015-04-09: qty 2

## 2015-04-09 MED ORDER — INSULIN GLARGINE 100 UNIT/ML ~~LOC~~ SOLN
5.0000 [IU] | Freq: Every day | SUBCUTANEOUS | Status: DC
  Administered 2015-04-09 – 2015-04-12 (×4): 5 [IU] via SUBCUTANEOUS
  Filled 2015-04-09 (×4): qty 0.05

## 2015-04-09 MED ORDER — VANCOMYCIN HCL 10 G IV SOLR
1500.0000 mg | Freq: Two times a day (BID) | INTRAVENOUS | Status: DC
  Administered 2015-04-10 – 2015-04-11 (×3): 1500 mg via INTRAVENOUS
  Filled 2015-04-09 (×5): qty 1500

## 2015-04-09 NOTE — Progress Notes (Signed)
Inpatient Diabetes Program Recommendations  AACE/ADA: New Consensus Statement on Inpatient Glycemic Control (2013)  Target Ranges:  Prepandial:   less than 140 mg/dL      Peak postprandial:   less than 180 mg/dL (1-2 hours)      Critically ill patients:  140 - 180 mg/dL   Reason for Visit: HgbA1C of 14.8%  Results for [name], [name] (MRN [id number]) as of 04/09/2015 13:08  Ref. Range 04/08/2015 11:45 04/08/2015 16:30 04/08/2015 21:41 04/09/2015 07:29 04/09/2015 11:45  Glucose-Capillary Latest Ref Range: 65-99 mg/dL 156 (H) 152 (H) 186 (H) 198 (H) 164 (H)  Results for [name], [name] (MRN [id number]) as of 04/09/2015 13:08  Ref. Range 04/07/2015 11:34  Hemoglobin A1C Latest Ref Range: 4.8-5.6 % 14.8 (H)   Lengthy discussion with pt and daughter regarding HgbA1C results. Answered questions. Pt will likely go home on insulin. Pt prefers insulin pen. Will order insulin starter kit and begin teaching insulin pen administration this afternoon.  Blood sugars well-controlled at present. Discussed with RN.  Thank you. Lorenda Peck, RD, LDN, CDE Inpatient Diabetes Coordinator [phone]

## 2015-04-09 NOTE — Progress Notes (Signed)
ANTIBIOTIC CONSULT NOTE - follow up  Pharmacy Consult for Vancomycin, Aztreonam Indication: Cellulitis w/ sepsis  Allergies  Allergen Reactions  . Nexium [Esomeprazole Magnesium] Other (See Comments)    Headache  . Penicillins Hives and Swelling  . Betadine [Povidone Iodine] Rash    Severe bumps and blistering  . Neosporin [Neomycin-Bacitracin Zn-Polymyx] Rash    blistering    Patient Measurements: Height: 5\' 6"  (167.6 cm) Weight: (!) 303 lb 2.1 oz (137.5 kg) IBW/kg (Calculated) : 59.3  Vital Signs: Temp: 98 F (36.7 C) (06/07 0943) Temp Source: Oral (06/07 0943) BP: 138/68 mmHg (06/07 0943) Pulse Rate: 107 (06/07 0943) Intake/Output from previous day: 06/06 0701 - 06/07 0700 In: 2377.5 [P.O.:240; I.V.:1587.5; IV Piggyback:550] Out: 3600 [Urine:3600] Intake/Output from this shift: Total I/O In: -  Out: 1800 [Urine:1800]  Labs:  Recent Labs  04/07/15 1134 04/08/15 0347 04/09/15 0345  WBC 27.9* 25.1* 15.4*  HGB 16.6* 14.1 13.5  PLT 177 173 167  CREATININE 0.93 0.80 0.70   Estimated Creatinine Clearance: 100.3 mL/min (by C-G formula based on Cr of 0.7). No results for input(s): VANCOTROUGH, VANCOPEAK, VANCORANDOM, GENTTROUGH, GENTPEAK, GENTRANDOM, TOBRATROUGH, TOBRAPEAK, TOBRARND, AMIKACINPEAK, AMIKACINTROU, AMIKACIN in the last 72 hours.   Microbiology: Recent Results (from the past 720 hour(s))  MRSA PCR Screening     Status: None   Collection Time: 04/07/15 10:59 AM  Result Value Ref Range Status   MRSA by PCR NEGATIVE NEGATIVE Final    Comment:        The GeneXpert MRSA Assay (FDA approved for NASAL specimens only), is one component of a comprehensive MRSA colonization surveillance program. It is not intended to diagnose MRSA infection nor to guide or monitor treatment for MRSA infections.   Blood Culture (routine x 2)     Status: None (Preliminary result)   Collection Time: 04/07/15 11:30 AM  Result Value Ref Range Status   Specimen Description  RIGHT ANTECUBITAL  Final   Special Requests BOTTLES DRAWN AEROBIC AND ANAEROBIC 5CC  Final   Culture   Final           BLOOD CULTURE RECEIVED NO GROWTH TO DATE CULTURE WILL BE HELD FOR 5 DAYS BEFORE ISSUING A FINAL NEGATIVE REPORT Performed at [facility]    Report Status PENDING  Incomplete  Blood Culture (routine x 2)     Status: None (Preliminary result)   Collection Time: 04/07/15 12:11 PM  Result Value Ref Range Status   Specimen Description BLOOD LEFT HAND  Final   Special Requests BOTTLES DRAWN AEROBIC AND ANAEROBIC 10 CC EACH  Final   Culture   Final           BLOOD CULTURE RECEIVED NO GROWTH TO DATE CULTURE WILL BE HELD FOR 5 DAYS BEFORE ISSUING A FINAL NEGATIVE REPORT Performed at [facility]    Report Status PENDING  Incomplete  Urine culture     Status: None   Collection Time: 04/07/15  5:00 PM  Result Value Ref Range Status   Specimen Description URINE, CLEAN CATCH  Final   Special Requests NONE  Final   Colony Count   Final    2,000 COLONIES/ML Performed at [facility]    Culture   Final    INSIGNIFICANT GROWTH Performed at [facility]    Report Status 04/08/2015 FINAL  Final    Medical History: Past Medical History  Diagnosis Date  . Hypertension   . Hypothyroidism   . OA (osteoarthritis)   .  Tendonitis of ankle   . Diverticulitis   . Obesity   . Vitamin D deficiency   . Anxiety   . Goiter   . History of Helicobacter pylori infection 05/25/2003    gastritis at EGD  . Seborrheic dermatitis   . PVC (premature ventricular contraction)   . Diverticulitis large intestine 01/2013  . Personal history of colonic adenomas 06/12/2013  . Diabetes mellitus without complication     Medications:  Anti-infectives    Start     Dose/Rate Route Frequency Ordered Stop   04/08/15 0600  vancomycin (VANCOCIN) IVPB 1000 mg/200 mL premix     1,000 mg 200 mL/hr over 60 Minutes Intravenous Every 12 hours 04/07/15 1626     04/07/15  2000  aztreonam (AZACTAM) 2 g in dextrose 5 % 50 mL IVPB     2 g 100 mL/hr over 30 Minutes Intravenous Every 8 hours 04/07/15 1626     04/07/15 1530  vancomycin (VANCOCIN) 1,500 mg in sodium chloride 0.9 % 500 mL IVPB     1,500 mg 250 mL/hr over 120 Minutes Intravenous  Once 04/07/15 1448 04/07/15 1851   04/07/15 1145  aztreonam (AZACTAM) 2 g in dextrose 5 % 50 mL IVPB     2 g 100 mL/hr over 30 Minutes Intravenous  Once 04/07/15 1133 04/07/15 1321   04/07/15 1145  vancomycin (VANCOCIN) IVPB 1000 mg/200 mL premix     1,000 mg 200 mL/hr over 60 Minutes Intravenous  Once 04/07/15 1133 04/07/15 1435     Assessment: 64 y.o. obese female with PMH hypothyroid, HTN, DMT2 presented to ED with right leg pain. Woke up this morning and felt chills, subjective fevers, with subsequent vomiting.  Endorses leg cramps for several days. Today when she got up she felt pain in her leg and redness around the ankle which significantly worsened in a few hours. Patient's daughter had noticed a spider yesterday on her clothes. Patient reports that the redness and pain continued to worsen and she also noticed some discomfort in the right groin.  Admitting for sepsis from cellulitis and possible UTI.  Received aztreonam 2g and vancomycin 1000 mg in ED x 1  6/5 >> vancomycin >> 6/5 >> aztreonam >>    Tmax: 102.8, now afebrile WBCs: trending down Renal: SCr wnl; CrCl 0.7; 80 N PCT 0.99 LA sl elevated  6/5 blood: NGTD 6/5 urine: NGF   Goal of Therapy:  Vancomycin trough level 15-20 mcg/ml  Eradication of infection Appropriate antibiotic dosing for indication and renal function  Plan:   Vancomycin 1000 mg IV q12 hr Measure vancomycin trough level tonight before 1800 dose Aztreonam 2g IV q8 hr  Follow clinical course, renal function, culture results as available  Follow for de-escalation of antibiotics and LOT   Dolly Rias RPh 04/09/2015, 1:22 PM Pager [phone]

## 2015-04-09 NOTE — Telephone Encounter (Signed)
Received records from [facility] for appointment on 06/05/15 with Dr Ellyn Hack.  Records given to [facility] (medical records) for Dr Allison Quarry schedule on 06/05/15. lp

## 2015-04-09 NOTE — Progress Notes (Signed)
Patient transferred from ICU to 1416, patient alert and oriented, pain better after pain med prior to transfer, right leg edema/red and warm to tough. No other skin issue noted.Oriented patient to room/unit and reviewed plan of care with patient/daughter.  Will continue to assess patient.

## 2015-04-09 NOTE — Progress Notes (Signed)
Patient being transferred to room 1416. Report called to floor. Daughter was notified of transfer and patient sent with all belongings and moved in wheelchair.

## 2015-04-09 NOTE — Progress Notes (Addendum)
Pharmacy - Vancomycin level  Assessment: 22 yoF receiving vancomycin 1g IV q12 for sepsis from cellulitis and possible UTI (see earlier note from Kavin Leech, PharmD for further details).  Noted PCN allergy with swelling.  1700 VT = 8 mcg/ml on 1g q12  Plan:  Will add an extra 500 mg to most recent vanc dose (still infusing) and increase vancomycin to 1500 mg IV q12.  Probably ok to aim for lower trough goal (10-15) given resolved sepsis and no obvious infection.  Reuel Boom, PharmD Pager: [phone] 04/09/2015, 7:10 PM

## 2015-04-09 NOTE — Progress Notes (Signed)
         [facility]                                                                              Patient Demographics  Sandy Daniels, is a 64 y.o. female, DOB - 12/22/1950, MRN:[id number]  Admit date - 04/07/2015   Admitting Physician Ripudeep K Rai, MD  Outpatient Primary MD for the patient is PHARR,WALTER DAVIDSON, MD  LOS - 2   Chief Complaint  Patient presents with  . Tachycardia  . Emesis  . Leg Pain       Brief HPI   Patient is a 64-year-old female with hypertension, hypothyroidism, recently diagnosed with diabetes mellitus 3 days ago presented to ED with right leg pain. Patient reported that she woke up this morning and felt chills and subjective fevers. Subsequently she had an episode of vomiting. She was had noticed her leg cramps for several days. Today when she got up she felt pain in her leg and redness around the ankle which significantly worsened in a few hours. Patient's daughter had noticed a spider yesterday on her clothes. Patient reports that the redness and pain continued to be as worse and she also noticed some discomfort in the right groin. She had fever of 102, felt thirsty and dehydrated. She was also recently diagnosed on Friday with diabetes mellitus by her PCP and was started on invokamet (canagliflozin/metformin). EDP determinations reviewed, ED for workup showed fever of 102.8 with a tachycardia 120-146, tachypnea and 34, hypotension 109/60. CBC revealed WBC of 27.9, hemoglobin 16.6, hematocrit 48.1, potassium 3.3, glucose 169.     Assessment & Plan    Principal Problem:  Sepsis with right lower extremity cellulitis, lactic acidosis: The patient met the sepsis criteria with fever, tachycardia, tachypnea, hypotension, leukocytosis, source of infection being cellulitis, UTI, lactic acid 2.06, UA with ketones - CT of the right lower extremity showed cellulitis, no abscess, myositis or fasciitis. - Doppler ultrasound of the right LE negative for  DVT - Continue IV vancomycin and aztreonam (penicillin allergy) - will give 1 dose of IV Lasix to reduce edema, DC IV fluids  Active Problems: Urinary tract infection -Urine culture from 6/5 still in process, continue IV aztreonam and vancomycin  Diabetes mellitus, recent diagnosis, uncontrolled with DKA: UA positive for ketones and lactic acidosis anion gap 20on admission - Discontinue IV fluids, Hold oral hypoglycemics, place on subcutaneous insulin, add meal coverage and Lantus -Hemoglobin A1c 14.8 , diabetic coordinator consult, nutrition consult   Hypothyroidism - TSH 0.47  continue Synthroid    Hypotension due to dehydration, UA positive for ketones, sepsis, hypovolemia - BP improving, discontinue IV fluids  Hypokalemia Replaced  Yeast infection under the breast Placed on nystatin powder  Code Status:  Full code  Family Communication: Discussed in detail with the patient, all imaging results, lab results explained to the patient and daughter at the bedside   Disposition Plan: Transfer to telemetry floor today  Time Spent in minutes   25 minutes  Procedures  Doppler lower extremity CT of the right tibia-fibula  Consults   None  DVT Prophylaxis  heparin subcutaneous  Medications  Scheduled Meds: . aztreonam  2   g Intravenous Q8H  . heparin  5,000 Units Subcutaneous 3 times per day  . insulin aspart  0-5 Units Subcutaneous QHS  . insulin aspart  0-9 Units Subcutaneous TID WC  . insulin aspart  3 Units Subcutaneous TID WC  . levothyroxine  100 mcg Oral QAC breakfast  . nystatin   Topical BID  . vancomycin  1,000 mg Intravenous Q12H   Continuous Infusions:   PRN Meds:.acetaminophen **OR** acetaminophen, HYDROcodone-acetaminophen, HYDROmorphone (DILAUDID) injection, ondansetron **OR** ondansetron (ZOFRAN) IV, senna-docusate   Antibiotics   Anti-infectives    Start     Dose/Rate Route Frequency Ordered Stop   04/08/15 0600  vancomycin (VANCOCIN) IVPB  1000 mg/200 mL premix     1,000 mg 200 mL/hr over 60 Minutes Intravenous Every 12 hours 04/07/15 1626     04/07/15 2000  aztreonam (AZACTAM) 2 g in dextrose 5 % 50 mL IVPB     2 g 100 mL/hr over 30 Minutes Intravenous Every 8 hours 04/07/15 1626     04/07/15 1530  vancomycin (VANCOCIN) 1,500 mg in sodium chloride 0.9 % 500 mL IVPB     1,500 mg 250 mL/hr over 120 Minutes Intravenous  Once 04/07/15 1448 04/07/15 1851   04/07/15 1145  aztreonam (AZACTAM) 2 g in dextrose 5 % 50 mL IVPB     2 g 100 mL/hr over 30 Minutes Intravenous  Once 04/07/15 1133 04/07/15 1321   04/07/15 1145  vancomycin (VANCOCIN) IVPB 1000 mg/200 mL premix     1,000 mg 200 mL/hr over 60 Minutes Intravenous  Once 04/07/15 1133 04/07/15 1435        Subjective:   Sandy Daniels was seen and examined today. Overall improving, complaining of right shoulder pain, states she slept wrong on the shoulder. Does not want x-ray. Right lower extremity still erythematous and somewhat tight. Patient denies dizziness, chest pain, shortness of breath, abdominal pain, N/V/D/C, new weakness, numbess, tingling. No acute events overnight.    Objective:   Blood pressure 138/68, pulse 107, temperature 98 F (36.7 C), temperature source Oral, resp. rate 16, height 5' 6" (1.676 m), weight 137.5 kg (303 lb 2.1 oz), SpO2 100 %.  Wt Readings from Last 3 Encounters:  04/09/15 137.5 kg (303 lb 2.1 oz)  09/20/13 137.44 kg (303 lb)  06/02/13 135.172 kg (298 lb)     Intake/Output Summary (Last 24 hours) at 04/09/15 1026 Last data filed at 04/09/15 0830  Gross per 24 hour  Intake   2140 ml  Output   4700 ml  Net  -2560 ml    Exam  General: Alert and oriented x 3, NAD  HEENT:  PERRLA, EOMI  Neck: Supple, no JVD, no masses  CVS: S1 S2 clear, RRR  Respiratory: CTAB  Abdomen: obese, Soft, nontender, nondistended, + bowel sounds  Ext: no cyanosis clubbing or edema  Neuro: AAOx3, Cr N's II- XII. Strength 5/5 upper and lower  extremities bilaterally  Skin: Right lower extremity cellulitis, still significant erythema and tight  Psych: Normal affect and demeanor, alert and oriented x3    Data Review   Micro Results Recent Results (from the past 240 hour(s))  MRSA PCR Screening     Status: None   Collection Time: 04/07/15 10:59 AM  Result Value Ref Range Status   MRSA by PCR NEGATIVE NEGATIVE Final    Comment:        The GeneXpert MRSA Assay (FDA approved for NASAL specimens only), is one component of a comprehensive MRSA colonization surveillance   program. It is not intended to diagnose MRSA infection nor to guide or monitor treatment for MRSA infections.   Blood Culture (routine x 2)     Status: None (Preliminary result)   Collection Time: 04/07/15 11:30 AM  Result Value Ref Range Status   Specimen Description RIGHT ANTECUBITAL  Final   Special Requests BOTTLES DRAWN AEROBIC AND ANAEROBIC 5CC  Final   Culture   Final           BLOOD CULTURE RECEIVED NO GROWTH TO DATE CULTURE WILL BE HELD FOR 5 DAYS BEFORE ISSUING A FINAL NEGATIVE REPORT Performed at [facility]    Report Status PENDING  Incomplete  Blood Culture (routine x 2)     Status: None (Preliminary result)   Collection Time: 04/07/15 12:11 PM  Result Value Ref Range Status   Specimen Description BLOOD LEFT HAND  Final   Special Requests BOTTLES DRAWN AEROBIC AND ANAEROBIC 10 CC EACH  Final   Culture   Final           BLOOD CULTURE RECEIVED NO GROWTH TO DATE CULTURE WILL BE HELD FOR 5 DAYS BEFORE ISSUING A FINAL NEGATIVE REPORT Performed at [facility]    Report Status PENDING  Incomplete  Urine culture     Status: None   Collection Time: 04/07/15  5:00 PM  Result Value Ref Range Status   Specimen Description URINE, CLEAN CATCH  Final   Special Requests NONE  Final   Colony Count   Final    2,000 COLONIES/ML Performed at [facility]    Culture   Final    INSIGNIFICANT GROWTH Performed at [facility]    Report Status 04/08/2015 FINAL  Final    Radiology Reports Ct Tibia Fibula Right W Contrast  04/08/2015   CLINICAL DATA:  Redness and swelling and cellulitis of the right lower leg since 04/07/2015.  EXAM: CT OF THE RIGHT TIBIA AND FIBULA WITH CONTRAST  TECHNIQUE: Axial images were obtained through the right lower leg. New sagittal and coronal reformatted images were then created.  CONTRAST:  100mL OMNIPAQUE IOHEXOL 300 MG/ML  SOLN  COMPARISON:  Radiographs dated 07/14/2009  FINDINGS: There is slight subcutaneous edema circumferentially in the distal right lower leg. There is no abscess, myositis, fasciitis, or osteomyelitis. There is no ankle joint effusion. Physiologic amount of fluid in the knee joint.  IMPRESSION: Slight subcutaneous edema in the right lower leg, nonspecific. This could represent mild cellulitis.   Electronically Signed   By: James  Maxwell M.D.   On: 04/08/2015 08:58   Dg Chest Port 1 View  04/07/2015   CLINICAL DATA:  Nausea and vomiting today.  Tachycardia.  EXAM: PORTABLE CHEST - 1 VIEW  COMPARISON:  None.  FINDINGS: The cardiac silhouette, mediastinal and hilar contours are within normal limits given the AP projection and portable technique. The lungs are clear. No pleural effusion or pneumothorax. The bony thorax is intact. Bilateral cervical ribs are noted.  IMPRESSION: No acute cardiopulmonary findings.   Electronically Signed   By: P.  Gallerani M.D.   On: 04/07/2015 12:59    CBC  Recent Labs Lab 04/07/15 1134 04/08/15 0347 04/09/15 0345  WBC 27.9* 25.1* 15.4*  HGB 16.6* 14.1 13.5  HCT 48.1* 42.6 41.8  PLT 177 173 167  MCV 86.7 89.1 87.8  MCH 29.9 29.5 28.4  MCHC 34.5 33.1 32.3  RDW 13.9 14.3 14.4  LYMPHSABS 1.4  --   --   MONOABS 1.1*  --   --     EOSABS 0.0  --   --   BASOSABS 0.0  --   --     Chemistries   Recent Labs Lab 04/07/15 1134 04/08/15 0347 04/09/15 0345  NA 136 138 137  K 3.3* 3.3* 3.4*  CL 97* 105 104  CO2 19* 22 23   GLUCOSE 169* 159* 171*  BUN 12 9 5*  CREATININE 0.93 0.80 0.70  CALCIUM 9.1 8.2* 8.4*  AST 45*  --   --   ALT 34  --   --   ALKPHOS 111  --   --   BILITOT 1.2  --   --    ------------------------------------------------------------------------------------------------------------------ estimated creatinine clearance is 100.3 mL/min (by C-G formula based on Cr of 0.7). ------------------------------------------------------------------------------------------------------------------  Recent Labs  04/07/15 1134  HGBA1C 14.8*   ------------------------------------------------------------------------------------------------------------------ No results for input(s): CHOL, HDL, LDLCALC, TRIG, CHOLHDL, LDLDIRECT in the last 72 hours. ------------------------------------------------------------------------------------------------------------------  Recent Labs  04/07/15 1134  TSH 0.472   ------------------------------------------------------------------------------------------------------------------ No results for input(s): VITAMINB12, FOLATE, FERRITIN, TIBC, IRON, RETICCTPCT in the last 72 hours.  Coagulation profile  Recent Labs Lab 04/07/15 1134  INR 1.04    No results for input(s): DDIMER in the last 72 hours.  Cardiac Enzymes No results for input(s): CKMB, TROPONINI, MYOGLOBIN in the last 168 hours.  Invalid input(s): CK ------------------------------------------------------------------------------------------------------------------ Invalid input(s): POCBNP   Recent Labs  04/07/15 1649 04/07/15 2129 04/08/15 0725 04/08/15 1630 04/08/15 2141 04/09/15 0729  GLUCAP 175* 181* 175* 152* 186* 198*     RAI,RIPUDEEP M.D. [facility] 04/09/2015, 10:26 AM  Pager: [phone]   Between 7am to 7pm - call Pager - [phone]  After 7pm go to [email or web address] - password [email or web address]  Call night coverage person covering after 7pm    

## 2015-04-10 DIAGNOSIS — N390 Urinary tract infection, site not specified: Secondary | ICD-10-CM

## 2015-04-10 DIAGNOSIS — L03115 Cellulitis of right lower limb: Secondary | ICD-10-CM

## 2015-04-10 DIAGNOSIS — E039 Hypothyroidism, unspecified: Secondary | ICD-10-CM

## 2015-04-10 LAB — BASIC METABOLIC PANEL
Anion gap: 9 (ref 5–15)
BUN: 5 mg/dL — ABNORMAL LOW (ref 6–20)
CALCIUM: 8.4 mg/dL — AB (ref 8.9–10.3)
CHLORIDE: 104 mmol/L (ref 101–111)
CO2: 26 mmol/L (ref 22–32)
CREATININE: 0.59 mg/dL (ref 0.44–1.00)
GFR calc non Af Amer: >60 mL/min (ref >60)
Glucose, Bld: 191 mg/dL — ABNORMAL HIGH (ref 65–99)
POTASSIUM: 3.4 mmol/L — AB (ref 3.5–5.1)
Sodium: 139 mmol/L (ref 135–145)

## 2015-04-10 LAB — CBC
HCT: 41.5 % (ref 36.0–46.0)
Hemoglobin: 13.5 g/dL (ref 12.0–15.0)
MCH: 29 pg (ref 26.0–34.0)
MCHC: 32.5 g/dL (ref 30.0–36.0)
MCV: 89.2 fL (ref 78.0–100.0)
PLATELETS: 192 10*3/uL (ref 150–400)
RBC: 4.65 MIL/uL (ref 3.87–5.11)
RDW: 14.4 % (ref 11.5–15.5)
WBC: 10.4 10*3/uL (ref 4.0–10.5)

## 2015-04-10 LAB — GLUCOSE, CAPILLARY
Glucose-Capillary: 190 mg/dL — ABNORMAL HIGH (ref 65–99)
Glucose-Capillary: 180 mg/dL — ABNORMAL HIGH (ref 65–99)
Glucose-Capillary: 166 mg/dL — ABNORMAL HIGH (ref 65–99)
Glucose-Capillary: 182 mg/dL — ABNORMAL HIGH (ref 65–99)

## 2015-04-10 MED ORDER — DIPHENHYDRAMINE HCL 25 MG PO CAPS
25.0000 mg | ORAL_CAPSULE | ORAL | Status: DC | PRN
  Administered 2015-04-10 – 2015-04-12 (×5): 25 mg via ORAL
  Filled 2015-04-10 (×5): qty 1

## 2015-04-10 MED ORDER — POTASSIUM CHLORIDE CRYS ER 20 MEQ PO TBCR
20.0000 meq | EXTENDED_RELEASE_TABLET | Freq: Once | ORAL | Status: AC
  Administered 2015-04-10: 20 meq via ORAL
  Filled 2015-04-10: qty 1

## 2015-04-10 MED ORDER — DIPHENHYDRAMINE HCL 50 MG/ML IJ SOLN
25.0000 mg | Freq: Once | INTRAMUSCULAR | Status: AC
  Administered 2015-04-10: 25 mg via INTRAVENOUS
  Filled 2015-04-10: qty 1

## 2015-04-10 NOTE — Progress Notes (Signed)
[facility]                                                                              Patient Demographics  Sandy Daniels, is a 64 y.o. female, DOB - [birth year], ZOX:[id number]  Admit date - 04/07/2015   Admitting Physician Ripudeep Krystal Eaton, MD  Outpatient Primary MD for the patient is Horatio Pel, MD  LOS - 3   Chief Complaint  Patient presents with  . Tachycardia  . Emesis  . Leg Pain       Brief HPI: From prior PN   Patient is a 64 year old female with hypertension, hypothyroidism, recently diagnosed with diabetes mellitus 3 days ago presented to ED with right leg pain. Patient reported that she woke up this morning and felt chills and subjective fevers. Subsequently she had an episode of vomiting. She was had noticed her leg cramps for several days. Today when she got up she felt pain in her leg and redness around the ankle which significantly worsened in a few hours. Patient's daughter had noticed a spider yesterday on her clothes. Patient reports that the redness and pain continued to be as worse and she also noticed some discomfort in the right groin. She had fever of 102, felt thirsty and dehydrated. She was also recently diagnosed on Friday with diabetes mellitus by her PCP and was started on invokamet (canagliflozin/metformin). EDP determinations reviewed, ED for workup showed fever of 102.8 with a tachycardia 120-146, tachypnea and 34, hypotension 109/60. CBC revealed WBC of 27.9, hemoglobin 16.6, hematocrit 48.1, potassium 3.3, glucose 169.     Assessment & Plan    Principal Problem:  Sepsis with right lower extremity cellulitis, lactic acidosis: The patient met the sepsis criteria with fever, tachycardia, tachypnea, hypotension, leukocytosis, source of infection being cellulitis, UTI, lactic acid 2.06, UA with ketones - CT of the right lower extremity showed cellulitis, no  Myositis, abscess, or fasciitis. - Doppler ultrasound of the right LE  negative for DVT - Continue IV vancomycin and aztreonam (penicillin allergy) for another 24 hours then change next am to oral antibiotics. - agree with d/c IVF's  Active Problems: Urinary tract infection -Urine culture from 6/5 reporting insignificant growth.  Diabetes mellitus, recent diagnosis, uncontrolled with DKA: UA positive for ketones and lactic acidosis anion gap 20on admission - Discontinue IV fluids, Hold oral hypoglycemics, place on subcutaneous insulin, add meal coverage and Lantus -Hemoglobin A1c 14.8 , diabetic coordinator consult, nutrition consult   Hypothyroidism - TSH 0.47  continue Synthroid    Hypotension due to dehydration, UA positive for ketones, sepsis, hypovolemia - BP improving, discontinue IV fluids  Hypokalemia Replace orally and reassess next a.m.  Yeast infection under the breast Continue nystatin powder  Code Status:  Full code  Family Communication: Discussed in detail with the patient, all imaging results, lab results explained to the patient and daughter at the bedside   Disposition Plan: Transfer to telemetry floor today  Time Spent in minutes   25 minutes  Procedures  Doppler lower extremity CT of the right tibia-fibula  Consults   None  DVT Prophylaxis  heparin subcutaneous  Medications  Scheduled  Meds: . aztreonam  2 g Intravenous Q8H  . heparin  5,000 Units Subcutaneous 3 times per day  . insulin aspart  0-5 Units Subcutaneous QHS  . insulin aspart  0-9 Units Subcutaneous TID WC  . insulin aspart  3 Units Subcutaneous TID WC  . insulin glargine  5 Units Subcutaneous Daily  . levothyroxine  100 mcg Oral QAC breakfast  . nystatin   Topical BID  . vancomycin  1,500 mg Intravenous Q12H   Continuous Infusions:   PRN Meds:.acetaminophen **OR** acetaminophen, diphenhydrAMINE, HYDROcodone-acetaminophen, HYDROmorphone (DILAUDID) injection, ondansetron **OR** ondansetron (ZOFRAN) IV, senna-docusate   Antibiotics    Anti-infectives    Start     Dose/Rate Route Frequency Ordered Stop   04/09/15 2000  vancomycin (VANCOCIN) 1,500 mg in sodium chloride 0.9 % 500 mL IVPB     1,500 mg 250 mL/hr over 120 Minutes Intravenous Every 12 hours 04/09/15 1902     04/09/15 2000  vancomycin (VANCOCIN) 500 mg in sodium chloride 0.9 % 100 mL IVPB     500 mg 100 mL/hr over 60 Minutes Intravenous  Once 04/09/15 1952 04/09/15 2111   04/08/15 0600  vancomycin (VANCOCIN) IVPB 1000 mg/200 mL premix  Status:  Discontinued     1,000 mg 200 mL/hr over 60 Minutes Intravenous Every 12 hours 04/07/15 1626 04/09/15 1902   04/07/15 2000  aztreonam (AZACTAM) 2 g in dextrose 5 % 50 mL IVPB     2 g 100 mL/hr over 30 Minutes Intravenous Every 8 hours 04/07/15 1626     04/07/15 1530  vancomycin (VANCOCIN) 1,500 mg in sodium chloride 0.9 % 500 mL IVPB     1,500 mg 250 mL/hr over 120 Minutes Intravenous  Once 04/07/15 1448 04/07/15 1851   04/07/15 1145  aztreonam (AZACTAM) 2 g in dextrose 5 % 50 mL IVPB     2 g 100 mL/hr over 30 Minutes Intravenous  Once 04/07/15 1133 04/07/15 1321   04/07/15 1145  vancomycin (VANCOCIN) IVPB 1000 mg/200 mL premix     1,000 mg 200 mL/hr over 60 Minutes Intravenous  Once 04/07/15 1133 04/07/15 1435        Subjective:   Sandy Daniels was seen and examined today. Reports improvement in cellulitis confirmed with daughter.  Objective:   Blood pressure 129/70, pulse 102, temperature 98.8 F (37.1 C), temperature source Oral, resp. rate 16, height 5' 6" (1.676 m), weight 137.5 kg (303 lb 2.1 oz), SpO2 98 %.  Wt Readings from Last 3 Encounters:  04/09/15 137.5 kg (303 lb 2.1 oz)  09/20/13 137.44 kg (303 lb)  06/02/13 135.172 kg (298 lb)     Intake/Output Summary (Last 24 hours) at 04/10/15 1636 Last data filed at 04/10/15 1300  Gross per 24 hour  Intake    660 ml  Output   1925 ml  Net  -1265 ml    Exam  General: Alert and awake, NAD  HEENT:  PERRLA, EOMI  CVS: S1 S2 clear,  RRR  Respiratory: CTAB, no wheezes  Abdomen: obese, Soft, nontender, nondistended, + bowel sounds  Ext: no cyanosis clubbing or edema  Neuro: AAOx3, Cr N's II- XII. Strength 5/5 upper and lower extremities bilaterally  Skin: Right lower extremity cellulitis, still significant erythema and tight  Psych: Normal affect and demeanor, alert and oriented x3    Data Review   Micro Results Recent Results (from the past 240 hour(s))  MRSA PCR Screening     Status: None   Collection Time: 04/07/15 10:59 AM  Result Value Ref Range Status   MRSA by PCR NEGATIVE NEGATIVE Final    Comment:        The GeneXpert MRSA Assay (FDA approved for NASAL specimens only), is one component of a comprehensive MRSA colonization surveillance program. It is not intended to diagnose MRSA infection nor to guide or monitor treatment for MRSA infections.   Urine culture     Status: None   Collection Time: 04/07/15 11:00 AM  Result Value Ref Range Status   Specimen Description URINE, CLEAN CATCH  Final   Special Requests NONE  Final   Colony Count   Final    45,000 COLONIES/ML Performed at [facility]    Culture   Final    GROUP B STREP(S.AGALACTIAE)ISOLATED Note: TESTING AGAINST S. AGALACTIAE NOT ROUTINELY PERFORMED DUE TO PREDICTABILITY OF AMP/PEN/VAN SUSCEPTIBILITY. Performed at [facility]    Report Status 04/09/2015 FINAL  Final  Blood Culture (routine x 2)     Status: None (Preliminary result)   Collection Time: 04/07/15 11:30 AM  Result Value Ref Range Status   Specimen Description RIGHT ANTECUBITAL  Final   Special Requests BOTTLES DRAWN AEROBIC AND ANAEROBIC 5CC  Final   Culture   Final           BLOOD CULTURE RECEIVED NO GROWTH TO DATE CULTURE WILL BE HELD FOR 5 DAYS BEFORE ISSUING A FINAL NEGATIVE REPORT Performed at [facility]    Report Status PENDING  Incomplete  Blood Culture (routine x 2)     Status: None (Preliminary result)   Collection Time:  04/07/15 12:11 PM  Result Value Ref Range Status   Specimen Description BLOOD LEFT HAND  Final   Special Requests BOTTLES DRAWN AEROBIC AND ANAEROBIC 10 CC EACH  Final   Culture   Final           BLOOD CULTURE RECEIVED NO GROWTH TO DATE CULTURE WILL BE HELD FOR 5 DAYS BEFORE ISSUING A FINAL NEGATIVE REPORT Performed at [facility]    Report Status PENDING  Incomplete  Urine culture     Status: None   Collection Time: 04/07/15  5:00 PM  Result Value Ref Range Status   Specimen Description URINE, CLEAN CATCH  Final   Special Requests NONE  Final   Colony Count   Final    2,000 COLONIES/ML Performed at [facility]    Culture   Final    INSIGNIFICANT GROWTH Performed at [facility]    Report Status 04/08/2015 FINAL  Final    Radiology Reports Ct Tibia Fibula Right W Contrast  04/08/2015   CLINICAL DATA:  Redness and swelling and cellulitis of the right lower leg since 04/07/2015.  EXAM: CT OF THE RIGHT TIBIA AND FIBULA WITH CONTRAST  TECHNIQUE: Axial images were obtained through the right lower leg. New sagittal and coronal reformatted images were then created.  CONTRAST:  162m OMNIPAQUE IOHEXOL 300 MG/ML  SOLN  COMPARISON:  Radiographs dated 07/14/2009  FINDINGS: There is slight subcutaneous edema circumferentially in the distal right lower leg. There is no abscess, myositis, fasciitis, or osteomyelitis. There is no ankle joint effusion. Physiologic amount of fluid in the knee joint.  IMPRESSION: Slight subcutaneous edema in the right lower leg, nonspecific. This could represent mild cellulitis.   Electronically Signed   By: JLorriane ShireM.D.   On: 04/08/2015 08:58   Dg Chest Port 1 View  04/07/2015   CLINICAL DATA:  Nausea and vomiting today.  Tachycardia.  EXAM: PORTABLE CHEST - 1 VIEW  COMPARISON:  None.  FINDINGS: The cardiac silhouette, mediastinal and hilar contours are within normal limits given the AP projection and portable technique. The lungs are  clear. No pleural effusion or pneumothorax. The bony thorax is intact. Bilateral cervical ribs are noted.  IMPRESSION: No acute cardiopulmonary findings.   Electronically Signed   By: P.  Gallerani M.D.   On: 04/07/2015 12:59    CBC  Recent Labs Lab 04/07/15 1134 04/08/15 0347 04/09/15 0345 04/10/15 0350  WBC 27.9* 25.1* 15.4* 10.4  HGB 16.6* 14.1 13.5 13.5  HCT 48.1* 42.6 41.8 41.5  PLT 177 173 167 192  MCV 86.7 89.1 87.8 89.2  MCH 29.9 29.5 28.4 29.0  MCHC 34.5 33.1 32.3 32.5  RDW 13.9 14.3 14.4 14.4  LYMPHSABS 1.4  --   --   --   MONOABS 1.1*  --   --   --   EOSABS 0.0  --   --   --   BASOSABS 0.0  --   --   --     Chemistries   Recent Labs Lab 04/07/15 1134 04/08/15 0347 04/09/15 0345 04/10/15 0350  NA 136 138 137 139  K 3.3* 3.3* 3.4* 3.4*  CL 97* 105 104 104  CO2 19* 22 23 26  GLUCOSE 169* 159* 171* 191*  BUN 12 9 5* 5*  CREATININE 0.93 0.80 0.70 0.59  CALCIUM 9.1 8.2* 8.4* 8.4*  AST 45*  --   --   --   ALT 34  --   --   --   ALKPHOS 111  --   --   --   BILITOT 1.2  --   --   --    ------------------------------------------------------------------------------------------------------------------ estimated creatinine clearance is 100.3 mL/min (by C-G formula based on Cr of 0.59). ------------------------------------------------------------------------------------------------------------------ No results for input(s): HGBA1C in the last 72 hours. ------------------------------------------------------------------------------------------------------------------ No results for input(s): CHOL, HDL, LDLCALC, TRIG, CHOLHDL, LDLDIRECT in the last 72 hours. ------------------------------------------------------------------------------------------------------------------ No results for input(s): TSH, T4TOTAL, T3FREE, THYROIDAB in the last 72 hours.  Invalid input(s):  FREET3 ------------------------------------------------------------------------------------------------------------------ No results for input(s): VITAMINB12, FOLATE, FERRITIN, TIBC, IRON, RETICCTPCT in the last 72 hours.  Coagulation profile  Recent Labs Lab 04/07/15 1134  INR 1.04    No results for input(s): DDIMER in the last 72 hours.  Cardiac Enzymes No results for input(s): CKMB, TROPONINI, MYOGLOBIN in the last 168 hours.  Invalid input(s): CK ------------------------------------------------------------------------------------------------------------------ Invalid input(s): POCBNP   Recent Labs  04/09/15 0729 04/09/15 1145 04/09/15 1638 04/09/15 2152 04/10/15 0715 04/10/15 1130  GLUCAP 198* 164* 205* 194* 190* 180*     VEGA, ORLANDO M.D. [facility] 04/10/2015, 4:36 PM  Pager: [phone]  After 7pm go to [email or web address] - password [email or web address]  Call night coverage person covering after 7pm    

## 2015-04-10 NOTE — Care Management Note (Signed)
Case Management Note  Patient Details  Name: Sandy Daniels MRN: [id number] Date of Birth: October 07, 1951  Subjective/Objective:   Transfer from SDU. DM ed following-insulin starter kit.Nsg to start teaching on insulin injections.If there is a teaching deficit in hospital, & documented, then I can arrange [facility] for teaching.                 Action/Plan:d/c plan  Home.   Expected Discharge Date:                  Expected Discharge Plan:  Home/Self Care  In-House Referral:  NA  Discharge planning Services  CM Consult  Post Acute Care Choice:  NA Choice offered to:  NA  DME Arranged:  N/A DME Agency:  NA  HH Arranged:  NA HH Agency:  NA  Status of Service:  In process, will continue to follow  Medicare Important Message Given:    Date Medicare IM Given:    Medicare IM give by:    Date Additional Medicare IM Given:    Additional Medicare Important Message give by:     If discussed at [place] of Stay Meetings, dates discussed:    Additional Comments:  Dessa Phi, RN 04/10/2015, 1:41 PM

## 2015-04-11 LAB — BASIC METABOLIC PANEL
ANION GAP: 7 (ref 5–15)
BUN: 8 mg/dL (ref 6–20)
CO2: 26 mmol/L (ref 22–32)
Calcium: 8.5 mg/dL — ABNORMAL LOW (ref 8.9–10.3)
Chloride: 107 mmol/L (ref 101–111)
Creatinine, Ser: 0.59 mg/dL (ref 0.44–1.00)
Glucose, Bld: 183 mg/dL — ABNORMAL HIGH (ref 65–99)
POTASSIUM: 3.5 mmol/L (ref 3.5–5.1)
Sodium: 140 mmol/L (ref 135–145)

## 2015-04-11 LAB — GLUCOSE, CAPILLARY
GLUCOSE-CAPILLARY: 173 mg/dL — AB (ref 65–99)
GLUCOSE-CAPILLARY: 166 mg/dL — AB (ref 65–99)
GLUCOSE-CAPILLARY: 167 mg/dL — AB (ref 65–99)
Glucose-Capillary: 188 mg/dL — ABNORMAL HIGH (ref 65–99)

## 2015-04-11 LAB — CBC
HEMATOCRIT: 41.6 % (ref 36.0–46.0)
HEMOGLOBIN: 13.5 g/dL (ref 12.0–15.0)
MCH: 28.4 pg (ref 26.0–34.0)
MCHC: 32.5 g/dL (ref 30.0–36.0)
MCV: 87.6 fL (ref 78.0–100.0)
Platelets: 221 10*3/uL (ref 150–400)
RBC: 4.75 MIL/uL (ref 3.87–5.11)
RDW: 14.5 % (ref 11.5–15.5)
WBC: 9.3 10*3/uL (ref 4.0–10.5)

## 2015-04-11 MED ORDER — SULFAMETHOXAZOLE-TRIMETHOPRIM 800-160 MG PO TABS
1.0000 | ORAL_TABLET | Freq: Two times a day (BID) | ORAL | Status: DC
  Administered 2015-04-11 – 2015-04-12 (×2): 1 via ORAL
  Filled 2015-04-11 (×2): qty 1

## 2015-04-11 NOTE — Progress Notes (Signed)
[facility]                                                                              Patient Demographics  Sandy Daniels, is a 64 y.o. female, DOB - 22-Jun-1951, OXB:[id number]  Admit date - 04/07/2015   Admitting Physician Ripudeep Krystal Eaton, MD  Outpatient Primary MD for the patient is Horatio Pel, MD  LOS - 4   Chief Complaint  Patient presents with  . Tachycardia  . Emesis  . Leg Pain       Brief HPI: From prior PN   Patient is a 64 year old female with hypertension, hypothyroidism, recently diagnosed with diabetes mellitus 3 days ago presented to ED with right leg pain. Patient reported that she woke up this morning and felt chills and subjective fevers. Subsequently she had an episode of vomiting. She was had noticed her leg cramps for several days. Today when she got up she felt pain in her leg and redness around the ankle which significantly worsened in a few hours. Patient's daughter had noticed a spider yesterday on her clothes. Patient reports that the redness and pain continued to be as worse and she also noticed some discomfort in the right groin. She had fever of 102, felt thirsty and dehydrated. She was also recently diagnosed on Friday with diabetes mellitus by her PCP and was started on invokamet (canagliflozin/metformin). EDP determinations reviewed, ED for workup showed fever of 102.8 with a tachycardia 120-146, tachypnea and 34, hypotension 109/60. CBC revealed WBC of 27.9, hemoglobin 16.6, hematocrit 48.1, potassium 3.3, glucose 169.     Assessment & Plan    Principal Problem:  Sepsis with right lower extremity cellulitis, lactic acidosis: The patient met the sepsis criteria with fever, tachycardia, tachypnea, hypotension, leukocytosis, source of infection being cellulitis, UTI, lactic acid 2.06, UA with ketones - CT of the right lower extremity showed cellulitis, no  Myositis, abscess, or fasciitis. - Doppler ultrasound of the right LE  negative for DVT - change to oral anabiotic Bactrim today - agree with d/c IVF's  Active Problems: Urinary tract infection -Urine culture from 6/5 reporting insignificant growth.  Diabetes mellitus, recent diagnosis, uncontrolled with DKA: UA positive for ketones and lactic acidosis anion gap 20on admission - Continue current insulin regimen -Hemoglobin A1c 14.8 , diabetic coordinator consult, nutrition consult   Hypothyroidism - TSH 0.47  continue Synthroid    Hypotension due to dehydration, UA positive for ketones, sepsis, hypovolemia - BP improving, discontinue IV fluids  Hypokalemia Replace orally and reassess next a.m.  Yeast infection under the breast Continue nystatin powder  Code Status:  Full code  Family Communication: Discussed in detail with the patient, all imaging results, lab results explained to the patient and daughter at the bedside   Disposition Plan: Transfer to telemetry floor today  Time Spent in minutes   25 minutes  Procedures  Doppler lower extremity CT of the right tibia-fibula  Consults   None  DVT Prophylaxis  heparin subcutaneous  Medications  Scheduled Meds: . aztreonam  2 g Intravenous Q8H  . heparin  5,000 Units Subcutaneous 3 times per day  . insulin aspart  0-5 Units Subcutaneous QHS  . insulin aspart  0-9 Units Subcutaneous TID WC  . insulin aspart  3 Units Subcutaneous TID WC  . insulin glargine  5 Units Subcutaneous Daily  . levothyroxine  100 mcg Oral QAC breakfast  . nystatin   Topical BID  . vancomycin  1,500 mg Intravenous Q12H   Continuous Infusions:   PRN Meds:.acetaminophen **OR** acetaminophen, diphenhydrAMINE, HYDROcodone-acetaminophen, HYDROmorphone (DILAUDID) injection, ondansetron **OR** ondansetron (ZOFRAN) IV, senna-docusate   Antibiotics   Anti-infectives    Start     Dose/Rate Route Frequency Ordered Stop   04/09/15 2000  vancomycin (VANCOCIN) 1,500 mg in sodium chloride 0.9 % 500 mL IVPB      1,500 mg 250 mL/hr over 120 Minutes Intravenous Every 12 hours 04/09/15 1902     04/09/15 2000  vancomycin (VANCOCIN) 500 mg in sodium chloride 0.9 % 100 mL IVPB     500 mg 100 mL/hr over 60 Minutes Intravenous  Once 04/09/15 1952 04/09/15 2111   04/08/15 0600  vancomycin (VANCOCIN) IVPB 1000 mg/200 mL premix  Status:  Discontinued     1,000 mg 200 mL/hr over 60 Minutes Intravenous Every 12 hours 04/07/15 1626 04/09/15 1902   04/07/15 2000  aztreonam (AZACTAM) 2 g in dextrose 5 % 50 mL IVPB     2 g 100 mL/hr over 30 Minutes Intravenous Every 8 hours 04/07/15 1626     04/07/15 1530  vancomycin (VANCOCIN) 1,500 mg in sodium chloride 0.9 % 500 mL IVPB     1,500 mg 250 mL/hr over 120 Minutes Intravenous  Once 04/07/15 1448 04/07/15 1851   04/07/15 1145  aztreonam (AZACTAM) 2 g in dextrose 5 % 50 mL IVPB     2 g 100 mL/hr over 30 Minutes Intravenous  Once 04/07/15 1133 04/07/15 1321   04/07/15 1145  vancomycin (VANCOCIN) IVPB 1000 mg/200 mL premix     1,000 mg 200 mL/hr over 60 Minutes Intravenous  Once 04/07/15 1133 04/07/15 1435        Subjective:   Sandy Daniels was seen and examined today. Reports improvement in cellulitis confirmed with daughter.  Objective:   Blood pressure 142/80, pulse 95, temperature 98.3 F (36.8 C), temperature source Oral, resp. rate 20, height '5\' 6"'  (1.676 m), weight 137.5 kg (303 lb 2.1 oz), SpO2 95 %.  Wt Readings from Last 3 Encounters:  04/09/15 137.5 kg (303 lb 2.1 oz)  09/20/13 137.44 kg (303 lb)  06/02/13 135.172 kg (298 lb)     Intake/Output Summary (Last 24 hours) at 04/11/15 1715 Last data filed at 04/11/15 1300  Gross per 24 hour  Intake   1200 ml  Output    900 ml  Net    300 ml    Exam  General: Alert and awake, NAD  HEENT:  PERRLA, EOMI  CVS: S1 S2 clear, RRR  Respiratory: CTAB, no wheezes  Abdomen: obese, Soft, nontender, nondistended, + bowel sounds  Ext: no cyanosis clubbing or edema  Neuro: AAOx3, Cr N's II- XII.  Strength 5/5 upper and lower extremities bilaterally  Skin: Right lower extremity cellulitis, still significant erythema and tight  Psych: Normal affect and demeanor, alert and oriented x3    Data Review   Micro Results Recent Results (from the past 240 hour(s))  MRSA PCR Screening     Status: None   Collection Time: 04/07/15 10:59 AM  Result Value Ref Range Status   MRSA by PCR NEGATIVE NEGATIVE Final    Comment:  The GeneXpert MRSA Assay (FDA approved for NASAL specimens only), is one component of a comprehensive MRSA colonization surveillance program. It is not intended to diagnose MRSA infection nor to guide or monitor treatment for MRSA infections.   Urine culture     Status: None   Collection Time: 04/07/15 11:00 AM  Result Value Ref Range Status   Specimen Description URINE, CLEAN CATCH  Final   Special Requests NONE  Final   Colony Count   Final    45,000 COLONIES/ML Performed at [facility]    Culture   Final    GROUP B STREP(S.AGALACTIAE)ISOLATED Note: TESTING AGAINST S. AGALACTIAE NOT ROUTINELY PERFORMED DUE TO PREDICTABILITY OF AMP/PEN/VAN SUSCEPTIBILITY. Performed at [facility]    Report Status 04/09/2015 FINAL  Final  Blood Culture (routine x 2)     Status: None (Preliminary result)   Collection Time: 04/07/15 11:30 AM  Result Value Ref Range Status   Specimen Description RIGHT ANTECUBITAL  Final   Special Requests BOTTLES DRAWN AEROBIC AND ANAEROBIC 5CC  Final   Culture   Final           BLOOD CULTURE RECEIVED NO GROWTH TO DATE CULTURE WILL BE HELD FOR 5 DAYS BEFORE ISSUING A FINAL NEGATIVE REPORT Performed at [facility]    Report Status PENDING  Incomplete  Blood Culture (routine x 2)     Status: None (Preliminary result)   Collection Time: 04/07/15 12:11 PM  Result Value Ref Range Status   Specimen Description BLOOD LEFT HAND  Final   Special Requests BOTTLES DRAWN AEROBIC AND ANAEROBIC 10 CC EACH  Final    Culture   Final           BLOOD CULTURE RECEIVED NO GROWTH TO DATE CULTURE WILL BE HELD FOR 5 DAYS BEFORE ISSUING A FINAL NEGATIVE REPORT Performed at [facility]    Report Status PENDING  Incomplete  Urine culture     Status: None   Collection Time: 04/07/15  5:00 PM  Result Value Ref Range Status   Specimen Description URINE, CLEAN CATCH  Final   Special Requests NONE  Final   Colony Count   Final    2,000 COLONIES/ML Performed at [facility]    Culture   Final    INSIGNIFICANT GROWTH Performed at [facility]    Report Status 04/08/2015 FINAL  Final    Radiology Reports Ct Tibia Fibula Right W Contrast  04/08/2015   CLINICAL DATA:  Redness and swelling and cellulitis of the right lower leg since 04/07/2015.  EXAM: CT OF THE RIGHT TIBIA AND FIBULA WITH CONTRAST  TECHNIQUE: Axial images were obtained through the right lower leg. New sagittal and coronal reformatted images were then created.  CONTRAST:  121m OMNIPAQUE IOHEXOL 300 MG/ML  SOLN  COMPARISON:  Radiographs dated 07/14/2009  FINDINGS: There is slight subcutaneous edema circumferentially in the distal right lower leg. There is no abscess, myositis, fasciitis, or osteomyelitis. There is no ankle joint effusion. Physiologic amount of fluid in the knee joint.  IMPRESSION: Slight subcutaneous edema in the right lower leg, nonspecific. This could represent mild cellulitis.   Electronically Signed   By: JLorriane ShireM.D.   On: 04/08/2015 08:58   Dg Chest Port 1 View  04/07/2015   CLINICAL DATA:  Nausea and vomiting today.  Tachycardia.  EXAM: PORTABLE CHEST - 1 VIEW  COMPARISON:  None.  FINDINGS: The cardiac silhouette, mediastinal and hilar contours are within normal  limits given the AP projection and portable technique. The lungs are clear. No pleural effusion or pneumothorax. The bony thorax is intact. Bilateral cervical ribs are noted.  IMPRESSION: No acute cardiopulmonary findings.   Electronically Signed    By: Marijo Sanes M.D.   On: 04/07/2015 12:59    CBC  Recent Labs Lab 04/07/15 1134 04/08/15 0347 04/09/15 0345 04/10/15 0350 04/11/15 0445  WBC 27.9* 25.1* 15.4* 10.4 9.3  HGB 16.6* 14.1 13.5 13.5 13.5  HCT 48.1* 42.6 41.8 41.5 41.6  PLT 177 173 167 192 221  MCV 86.7 89.1 87.8 89.2 87.6  MCH 29.9 29.5 28.4 29.0 28.4  MCHC 34.5 33.1 32.3 32.5 32.5  RDW 13.9 14.3 14.4 14.4 14.5  LYMPHSABS 1.4  --   --   --   --   MONOABS 1.1*  --   --   --   --   EOSABS 0.0  --   --   --   --   BASOSABS 0.0  --   --   --   --     Chemistries   Recent Labs Lab 04/07/15 1134 04/08/15 0347 04/09/15 0345 04/10/15 0350 04/11/15 0445  NA 136 138 137 139 140  K 3.3* 3.3* 3.4* 3.4* 3.5  CL 97* 105 104 104 107  CO2 19* '22 23 26 26  ' GLUCOSE 169* 159* 171* 191* 183*  BUN 12 9 5* 5* 8  CREATININE 0.93 0.80 0.70 0.59 0.59  CALCIUM 9.1 8.2* 8.4* 8.4* 8.5*  AST 45*  --   --   --   --   ALT 34  --   --   --   --   ALKPHOS 111  --   --   --   --   BILITOT 1.2  --   --   --   --    ------------------------------------------------------------------------------------------------------------------ estimated creatinine clearance is 100.3 mL/min (by C-G formula based on Cr of 0.59). ------------------------------------------------------------------------------------------------------------------ No results for input(s): HGBA1C in the last 72 hours. ------------------------------------------------------------------------------------------------------------------ No results for input(s): CHOL, HDL, LDLCALC, TRIG, CHOLHDL, LDLDIRECT in the last 72 hours. ------------------------------------------------------------------------------------------------------------------ No results for input(s): TSH, T4TOTAL, T3FREE, THYROIDAB in the last 72 hours.  Invalid input(s): FREET3 ------------------------------------------------------------------------------------------------------------------ No results for  input(s): VITAMINB12, FOLATE, FERRITIN, TIBC, IRON, RETICCTPCT in the last 72 hours.  Coagulation profile  Recent Labs Lab 04/07/15 1134  INR 1.04    No results for input(s): DDIMER in the last 72 hours.  Cardiac Enzymes No results for input(s): CKMB, TROPONINI, MYOGLOBIN in the last 168 hours.  Invalid input(s): CK ------------------------------------------------------------------------------------------------------------------ Invalid input(s): [place]  04/10/15 1130 04/10/15 1637 04/10/15 2228 04/11/15 0715 04/11/15 1129 04/11/15 1639  GLUCAP 180* 166* 182* 173* 188* 166*     Velvet Bathe M.D. [facility] 04/11/2015, 5:15 PM  Pager: [phone]  After 7pm go to [email or web address] - password [email or web address]  Call night coverage person covering after 7pm

## 2015-04-12 DIAGNOSIS — A419 Sepsis, unspecified organism: Principal | ICD-10-CM

## 2015-04-12 LAB — GLUCOSE, CAPILLARY
GLUCOSE-CAPILLARY: 165 mg/dL — AB (ref 65–99)
GLUCOSE-CAPILLARY: 163 mg/dL — AB (ref 65–99)

## 2015-04-12 MED ORDER — SULFAMETHOXAZOLE-TRIMETHOPRIM 800-160 MG PO TABS: 1.0000 | ORAL_TABLET | Freq: Two times a day (BID) | ORAL | Status: DC

## 2015-04-12 MED ORDER — HYDROCODONE-ACETAMINOPHEN 5-325 MG PO TABS: 1.0000 | ORAL_TABLET | ORAL | Status: DC | PRN

## 2015-04-12 MED ORDER — BLOOD GLUCOSE METER KIT: PACK | Status: DC

## 2015-04-12 MED ORDER — INSULIN GLARGINE 100 UNIT/ML SOLOSTAR PEN: 5.0000 [IU] | PEN_INJECTOR | Freq: Every day | SUBCUTANEOUS | Status: DC

## 2015-04-12 NOTE — Discharge Summary (Signed)
Physician Discharge Summary  Sandy Daniels RJJ:[id number] DOB: 21-Dec-1950 DOA: 04/07/2015  PCP: Horatio Pel, MD  Admit date: 04/07/2015 Discharge date: 04/12/2015  Time spent: > 35 minutes  Recommendations for Outpatient Follow-up:  1. Monitor blood sugars and adjust hypoglycemic agents accordingly 2. Decide whether or not to prolong antibiotics regimen  Discharge Diagnoses:  Principal Problem:   Sepsis Active Problems:   Hypothyroidism   Cellulitis   Hypotension   Diabetes mellitus   UTI (lower urinary tract infection)   Discharge Condition: Stable  Diet recommendation: Diabetic diet  Filed Weights   04/07/15 1104 04/08/15 0307 04/09/15 0400  Weight: 127.007 kg (280 lb) 132.7 kg (292 lb 8.8 oz) 137.5 kg (303 lb 2.1 oz)    History of present illness:  From original history of present illness 64 year old female with hypertension, hypothyroidism, recently diagnosed with diabetes mellitus 3 days ago presented to ED with right leg pain. Patient reported that she woke up this morning and felt chills and subjective fevers. Subsequently she had an episode of vomiting. She was had noticed her leg cramps for several days. Today when she got up she felt pain in her leg and redness around the ankle which significantly worsened in a few hours.   Hospital Course:  Right lower extremity cellulitis - We'll continue Bactrim for 5 more days of complete a ten-day treatment course. Right lower extremity cellulitis looked improved on day of discharge  DM - Lantus 5 units subcutaneous with daily on discharge. We'll provide prescriptions for Lantus solo*pen and glucose meter with lancets and strips  Hypothyroidism -Continue Synthroid  Hypokalemia - Resolved  Sepsis - Resolved with antibiotics  Procedures:  None  Consultations:  None  Discharge Exam: Filed Vitals:   04/12/15 0426  BP: 146/66  Pulse: 95  Temp: 98.2 F (36.8 C)  Resp: 20    General: Pt in nad, alert  and awake Cardiovascular: rrr, no mrg Respiratory: cta bl, no wheezes  Discharge Instructions   Discharge Instructions    Ambulatory referral to Nutrition and Diabetic Education    Complete by:  As directed      Call MD for:  extreme fatigue    Complete by:  As directed      Call MD for:  severe uncontrolled pain    Complete by:  As directed      Call MD for:  temperature >100.4    Complete by:  As directed      Diet - low sodium heart healthy    Complete by:  As directed      Discharge instructions    Complete by:  As directed   Pt to f/u with her pcp in 1-2 week or sooner should any new concerns arise.  Monitor blood sugars at least 2 times daily. Once preprandial and once postprandial.     Increase activity slowly    Complete by:  As directed           Current Discharge Medication List    START taking these medications   Details  blood glucose meter kit and supplies Dispense based on patient and insurance preference. Use up to four times daily as directed. (FOR ICD-9 250.00, 250.01). Qty: 1 each, Refills: 0    HYDROcodone-acetaminophen (NORCO/VICODIN) 5-325 MG per tablet Take 1 tablet by mouth every 4 (four) hours as needed for moderate pain. Qty: 30 tablet, Refills: 0    Insulin Glargine (LANTUS SOLOSTAR) 100 UNIT/ML Solostar Pen Inject 5 Units into the skin daily  at 10 pm. Qty: 15 mL, Refills: 0    sulfamethoxazole-trimethoprim (BACTRIM DS,SEPTRA DS) 800-160 MG per tablet Take 1 tablet by mouth every 12 (twelve) hours. Qty: 10 tablet, Refills: 0      CONTINUE these medications which have NOT CHANGED   Details  levothyroxine (SYNTHROID, LEVOTHROID) 100 MCG tablet Take 100 mcg by mouth daily before breakfast.      STOP taking these medications     Canagliflozin-Metformin HCl 50-1000 MG TABS      Linaclotide (LINZESS) 290 MCG CAPS capsule        Allergies  Allergen Reactions  . Nexium [Esomeprazole Magnesium] Other (See Comments)    Headache  .  Penicillins Hives and Swelling  . Betadine [Povidone Iodine] Rash    Severe bumps and blistering  . Neosporin [Neomycin-Bacitracin Zn-Polymyx] Rash    blistering      The results of significant diagnostics from this hospitalization (including imaging, microbiology, ancillary and laboratory) are listed below for reference.    Significant Diagnostic Studies: Ct Tibia Fibula Right W Contrast  04/08/2015   CLINICAL DATA:  Redness and swelling and cellulitis of the right lower leg since 04/07/2015.  EXAM: CT OF THE RIGHT TIBIA AND FIBULA WITH CONTRAST  TECHNIQUE: Axial images were obtained through the right lower leg. New sagittal and coronal reformatted images were then created.  CONTRAST:  100mL OMNIPAQUE IOHEXOL 300 MG/ML  SOLN  COMPARISON:  Radiographs dated 07/14/2009  FINDINGS: There is slight subcutaneous edema circumferentially in the distal right lower leg. There is no abscess, myositis, fasciitis, or osteomyelitis. There is no ankle joint effusion. Physiologic amount of fluid in the knee joint.  IMPRESSION: Slight subcutaneous edema in the right lower leg, nonspecific. This could represent mild cellulitis.   Electronically Signed   By: James  Maxwell M.D.   On: 04/08/2015 08:58   Dg Chest Port 1 View  04/07/2015   CLINICAL DATA:  Nausea and vomiting today.  Tachycardia.  EXAM: PORTABLE CHEST - 1 VIEW  COMPARISON:  None.  FINDINGS: The cardiac silhouette, mediastinal and hilar contours are within normal limits given the AP projection and portable technique. The lungs are clear. No pleural effusion or pneumothorax. The bony thorax is intact. Bilateral cervical ribs are noted.  IMPRESSION: No acute cardiopulmonary findings.   Electronically Signed   By: P.  Gallerani M.D.   On: 04/07/2015 12:59    Microbiology: Recent Results (from the past 240 hour(s))  MRSA PCR Screening     Status: None   Collection Time: 04/07/15 10:59 AM  Result Value Ref Range Status   MRSA by PCR NEGATIVE NEGATIVE  Final    Comment:        The GeneXpert MRSA Assay (FDA approved for NASAL specimens only), is one component of a comprehensive MRSA colonization surveillance program. It is not intended to diagnose MRSA infection nor to guide or monitor treatment for MRSA infections.   Urine culture     Status: None   Collection Time: 04/07/15 11:00 AM  Result Value Ref Range Status   Specimen Description URINE, CLEAN CATCH  Final   Special Requests NONE  Final   Colony Count   Final    45,000 COLONIES/ML Performed at [facility]    Culture   Final    GROUP B STREP(S.AGALACTIAE)ISOLATED Note: TESTING AGAINST S. AGALACTIAE NOT ROUTINELY PERFORMED DUE TO PREDICTABILITY OF AMP/PEN/VAN SUSCEPTIBILITY. Performed at [facility]    Report Status 04/09/2015 FINAL  Final  Blood Culture (routine x   2)     Status: None (Preliminary result)   Collection Time: 04/07/15 11:30 AM  Result Value Ref Range Status   Specimen Description RIGHT ANTECUBITAL  Final   Special Requests BOTTLES DRAWN AEROBIC AND ANAEROBIC 5CC  Final   Culture   Final           BLOOD CULTURE RECEIVED NO GROWTH TO DATE CULTURE WILL BE HELD FOR 5 DAYS BEFORE ISSUING A FINAL NEGATIVE REPORT Performed at [facility]    Report Status PENDING  Incomplete  Blood Culture (routine x 2)     Status: None (Preliminary result)   Collection Time: 04/07/15 12:11 PM  Result Value Ref Range Status   Specimen Description BLOOD LEFT HAND  Final   Special Requests BOTTLES DRAWN AEROBIC AND ANAEROBIC 10 CC EACH  Final   Culture   Final           BLOOD CULTURE RECEIVED NO GROWTH TO DATE CULTURE WILL BE HELD FOR 5 DAYS BEFORE ISSUING A FINAL NEGATIVE REPORT Performed at [facility]    Report Status PENDING  Incomplete  Urine culture     Status: None   Collection Time: 04/07/15  5:00 PM  Result Value Ref Range Status   Specimen Description URINE, CLEAN CATCH  Final   Special Requests NONE  Final   Colony Count    Final    2,000 COLONIES/ML Performed at [facility]    Culture   Final    INSIGNIFICANT GROWTH Performed at [facility]    Report Status 04/08/2015 FINAL  Final     Labs: Basic Metabolic Panel:  Recent Labs Lab 04/07/15 1134 04/08/15 0347 04/09/15 0345 04/10/15 0350 04/11/15 0445  NA 136 138 137 139 140  K 3.3* 3.3* 3.4* 3.4* 3.5  CL 97* 105 104 104 107  CO2 19* _0 GLUCOSE 169* 159* 171* 191* 183*  BUN 12 9 5* 5* 8  CREATININE 0.93 0.80 0.70 0.59 0.59  CALCIUM 9.1 8.2* 8.4* 8.4* 8.5*   Liver Function Tests:  Recent Labs Lab 04/07/15 1134  AST 45*  ALT 34  ALKPHOS 111  BILITOT 1.2  PROT 7.8  ALBUMIN 3.9   No results for input(s): LIPASE, AMYLASE in the last 168 hours. No results for input(s): AMMONIA in the last 168 hours. CBC:  Recent Labs Lab 04/07/15 1134 04/08/15 0347 04/09/15 0345 04/10/15 0350 04/11/15 0445  WBC 27.9* 25.1* 15.4* 10.4 9.3  NEUTROABS 25.4*  --   --   --   --   HGB 16.6* 14.1 13.5 13.5 13.5  HCT 48.1* 42.6 41.8 41.5 41.6  MCV 86.7 89.1 87.8 89.2 87.6  PLT 177 173 167 192 221   Cardiac Enzymes: No results for input(s): CKTOTAL, CKMB, CKMBINDEX, TROPONINI in the last 168 hours. BNP: BNP (last 3 results) No results for input(s): BNP in the last 8760 hours.  ProBNP (last 3 results) No results for input(s): PROBNP in the last 8760 hours.  CBG:  Recent Labs Lab 04/11/15 1129 04/11/15 1639 04/11/15 2058 04/12/15 0720 04/12/15 1139  GLUCAP 188* 166* 167* 165* 163*       Signed:  Velvet Bathe  [facility] 04/12/2015, 3:09 PM

## 2015-04-12 NOTE — Progress Notes (Signed)
Inpatient Diabetes Program Recommendations  AACE/ADA: New Consensus Statement on Inpatient Glycemic Control (2013)  Target Ranges:  Prepandial:   less than 140 mg/dL      Peak postprandial:   less than 180 mg/dL (1-2 hours)      Critically ill patients:  140 - 180 mg/dL   Reason for Visit: Review of insulin pen  Reviewed insulin pen instructions and pt was able to return demonstration. Feels confident regarding insulin administration with pen. Requests prescription for meter, strips and supplies. Will f/u with PCP on Tuesday, June 14th. To take blood sugar log for MD to review.  Will need scripts for: Lantus solostar pen Insulin pen needles Glucose meter with supplies  Discussed with RN. Thank you. Lorenda Peck, RD, LDN, CDE Inpatient Diabetes Coordinator [phone]

## 2015-04-13 LAB — CULTURE, BLOOD (ROUTINE X 2)
CULTURE: NO GROWTH
Culture: NO GROWTH

## 2015-04-15 ENCOUNTER — Encounter: Payer: Self-pay | Admitting: Cardiology

## 2015-04-15 NOTE — Progress Notes (Signed)
Quick Note:  EKG - Sinus Tachycardia - 115. P wave enlarge c/w RAA. NO ischemic changes.  [place]  ______

## 2015-06-05 ENCOUNTER — Encounter: Payer: Self-pay | Admitting: Cardiology

## 2015-06-05 ENCOUNTER — Ambulatory Visit (INDEPENDENT_AMBULATORY_CARE_PROVIDER_SITE_OTHER): Payer: 59 | Admitting: Cardiology

## 2015-06-05 VITALS — BP 136/88 | HR 109 | Ht 66.0 in | Wt 290.1 lb

## 2015-06-05 DIAGNOSIS — I10 Essential (primary) hypertension: Secondary | ICD-10-CM

## 2015-06-05 DIAGNOSIS — R9431 Abnormal electrocardiogram [ECG] [EKG]: Secondary | ICD-10-CM

## 2015-06-05 DIAGNOSIS — E1165 Type 2 diabetes mellitus with hyperglycemia: Secondary | ICD-10-CM | POA: Diagnosis not present

## 2015-06-05 DIAGNOSIS — E8881 Metabolic syndrome: Secondary | ICD-10-CM | POA: Diagnosis not present

## 2015-06-05 DIAGNOSIS — IMO0002 Reserved for concepts with insufficient information to code with codable children: Secondary | ICD-10-CM

## 2015-06-05 NOTE — Progress Notes (Signed)
PATIENT: Sandy Daniels MRN: [id number] DOB: 05/17/1951 PCP: Horatio Pel, MD  Clinic Note: Chief Complaint  Patient presents with  . Edema    acute tendonitis in left ankle (both ankles broken 2005, 2011)  . Establish Care    HPI: Sandy Daniels is a 64 y.o. female with a PMH below who presents today for re-establish cardiology care..  Seen in 2012 - [facility] - reviewed and entered into past surgical history section  Recent Hospitalization June 5-10 2016: New Dx of DM - DKA 2/2 RLE Cellulitis/UTI related Sepsis. R/o DVT with ultrasound   Interval History: In is a pleasant, morbidly obese African-American woman who I saw back in 2012 for an abnormal EKG. Her cardiac risk factors include obesity, hypertension and now newly diagnosed diabetes which was not unexpected. It is very poorly controlled having had a recent episode of DKA with cellulitis. Her A1c is greater than 15. She has chronic lower extremity edema ever since ankle fractures years ago. The left-sided symptoms to be worse in the right because of tendinitis in that ankle. She apparently had an episode of right leg cellulitis that triggered DKA. She was then scheduled to see Dr. Shelia Media for general medical evaluation.  He was referred again due to abnormal EKG. Overall from a cardiac standpoint she seems to doing pretty well. She does have some sinus tachycardia on EKG but no others abnormal findings other than mild rightward axis deviation that is probably related to body habitus. She denies any chest pain with rest or exertion. She does have some exertional dyspnea simply because of being deconditioned and obese, but nothing out of the ordinary. She denies any rapid/irregular heartbeats or palpitations. Is not aware that her heart rate is going fast currently. She denies any syncope or near syncope or TIA/amaurosis fugax symptoms. No claudication symptoms. No heart failure symptoms of PND, orthopnea to go along with  edema.   Past Medical History  Diagnosis Date  . Hypertension   . Hypothyroidism     On thyroid supplementation  . OA (osteoarthritis)   . Tendonitis of ankle   . Diverticulitis   . Morbid obesity with BMI of 45.0-49.9, adult   . Vitamin D deficiency   . Anxiety   . Goiter   . History of Helicobacter pylori infection 05/25/2003    gastritis at EGD  . Seborrheic dermatitis   . PVC (premature ventricular contraction)   . Diverticulitis large intestine 01/2013  . Personal history of colonic adenomas 06/12/2013  . Diabetes mellitus without complication     Prior Cardiac Evaluation and Past Surgical History: Past Surgical History  Procedure Laterality Date  . Esophagogastroduodenoscopy  05/25/2003    H. Pylori, Dr. Jim Desanctis  . Colonoscopy  03/15/2002    internal hemorrhoids, Dr. Jim Desanctis  . Tonsillectomy    . Vaginal hysterectomy    . Nm myoview ltd  10/08/2011    LOW RISK study. EF 68%. Breast attenuation artifact noted otherwise normal study.  . Transthoracic echocardiogram  10/08/2011    Normal study. No valve lesions. Normal LV function, EF >55%, GR 1 DD. No valve lesions    Allergies  Allergen Reactions  . Nexium [Esomeprazole Magnesium] Other (See Comments)    Headache  . Penicillins Hives and Swelling  . Betadine [Povidone Iodine] Rash    Severe bumps and blistering  . Neosporin [Neomycin-Bacitracin Zn-Polymyx] Rash    blistering    Current Outpatient Prescriptions  Medication Sig Dispense Refill  .  BD PEN NEEDLE NANO U/F 32G X 4 MM MISC Inject 5 Units as directed 3 (three) times daily.  12  . blood glucose meter kit and supplies Dispense based on patient and insurance preference. Use up to four times daily as directed. (FOR ICD-9 250.00, 250.01). 1 each 0  . HYDROcodone-acetaminophen (NORCO/VICODIN) 5-325 MG per tablet Take 1 tablet by mouth every 4 (four) hours as needed for moderate pain. 30 tablet 0  . Insulin Glargine (LANTUS SOLOSTAR) 100 UNIT/ML  Solostar Pen Inject 5 Units into the skin daily at 10 pm. 15 mL 0  . levothyroxine (SYNTHROID, LEVOTHROID) 100 MCG tablet Take 100 mcg by mouth daily before breakfast.    . ONE TOUCH ULTRA TEST test strip 1 strip by Other route 3 (three) times daily.  0  . ONETOUCH DELICA LANCETS 63Z MISC Inject 1 each as directed every evening.  0  . triamterene-hydrochlorothiazide (MAXZIDE-25) 37.5-25 MG per tablet Take 1 tablet by mouth every morning.  99   No current facility-administered medications for this visit.    History   Social History Narrative   [employer] - accounting department   Divorced   1 daughter    family history includes Dementia in her father; Diabetes in her father and mother; Heart disease in her father and mother; Hypertension in her father. There is no history of Colon cancer, Rectal cancer, or Stomach cancer.  ROS: A comprehensive Review of Systems - was performed Review of Systems  Constitutional: Negative for malaise/fatigue (she does feel tired but not to the point of fatigue).  Respiratory: Positive for shortness of breath (With exertion).   Cardiovascular: Positive for palpitations and leg swelling (this is been present since her ankle fractures and tendinitis several years ago. It does go down some with elevating her feet but not fully.).  Gastrointestinal: Negative for blood in stool and melena.  Genitourinary: Negative for hematuria.  Endo/Heme/Allergies: Does not bruise/bleed easily.  Psychiatric/Behavioral: The patient is nervous/anxious.   All other systems reviewed and are negative.   PHYSICAL EXAM BP 136/88 mmHg  Pulse 109  Ht '5\' 6"'  (1.676 m)  Wt 290 lb 1.6 oz (131.588 kg)  BMI 46.85 kg/m2 General appearance: alert, cooperative, appears stated age, no distress, morbidly obese and Very anxious and tearful. Neck: no adenopathy, no carotid bruit, supple, symmetrical, trachea midline and Unable to assess JVP or even for carotid bruits due to body  habitus Lungs: clear to auscultation bilaterally, normal percussion bilaterally and Nonlabored Heart: Very distant heart sounds. Tachycardic but normal rhythm. Unable to palpate any PMI. No auscultated M/R/G Abdomen: Morbidly obese, but soft/NT/ND/NABS. Unable to assess HSM Extremities: edema Bilaterally at least 2+ edema at the ankles and mid leg. Not necessarily pitting. and venous stasis dermatitis noted Pulses: Difficult to palpate pulses due to edema. Pedal pulses are palpable but very faint Skin: Skin color, texture, turgor normal. No rashes or lesions Neurologic: Mental status: Alert, oriented, thought content appropriate, affect: Anxious/labile Cranial nerves: normal   Adult ECG Report  Rate: 109 ;  Rhythm: sinus tachycardia  QRS Axis: 96 (rightward) ;  PR Interval: 142 ;  QRS Duration: 80 ; QTc: 482  Voltages: Borderline low secondary to body habitus  Narrative Interpretation: Otherwise normal EKG  Recent Labs: Reviewed from June hospital stay. Lipids were not included  ASSESSMENT / PLAN: Pleasant morbidly obese woman with significant cardiac risk factors that would make up the metabolic syndrome with obesity, hypertension and diabetes. I don't know what her  lipid panel looks like.  She has had an echocardiogram performed that was relatively normal 4 years ago. Has not had any significant cardiac symptoms to suggest CHF or CAD in the interim. At this point I don't see any clear indication for checking any additional studies without her having symptoms. We will continue to monitor her and his symptoms do arise, would have low threshold for further evaluation.  Problem List Items Addressed This Visit    Abnormal finding on EKG (Chronic)    Her EKG is somewhat abnormal with some rightward axis deviation and sinus tachycardia. Otherwise no ischemic changes 3 concerned about.  I would be reluctant to do an ischemic evaluation at this time in the absence of true symptoms. We can consider  a CPX test at her follow-up visit to establish a baseline of her functional status. I would not be surprised if she has microvascular ischemia.       Diabetes mellitus type 2, uncontrolled - Primary (Chronic)   Relevant Orders   EKG 12-Lead   Essential hypertension (Chronic)    Relatively well-controlled currently. She is only on Maxide which is related to her edema. Mostly because of her diabetes and cardiac risk factors, I would consider the addition of an ACE inhibitor or ARB      Relevant Medications   triamterene-hydrochlorothiazide (MAXZIDE-25) 37.5-25 MG per tablet   Other Relevant Orders   EKG 16-RCVE   Metabolic syndrome (Chronic)    This does put her at increased cardiovascular risk. We discussed the importance of dietary modification and exercise for weight loss. Aggressive management of her diabetes to get this under control is also very important. Hypertension management and that the management also very important. I don't see that she is on a statin which is probably something that should be considered.      Relevant Orders   EKG 12-Lead      NO CHANGES WITH CURRENT MEDICATIONS   Your physician wants you to follow-up in [place]. Joanna Borawski W. Ellyn Hack, M.D., M.S. Interventional Cardiolgy [facility]

## 2015-06-05 NOTE — Patient Instructions (Signed)
NO CHANGES WITH CURRENT MEDICATIONS   Your physician wants you to follow-up in [place]. You will receive a reminder letter in the mail two months in advance. If you don't receive a letter, please call our office to schedule the follow-up appointment.

## 2015-06-08 ENCOUNTER — Encounter: Payer: Self-pay | Admitting: Cardiology

## 2015-06-08 DIAGNOSIS — IMO0002 Reserved for concepts with insufficient information to code with codable children: Secondary | ICD-10-CM | POA: Insufficient documentation

## 2015-06-08 DIAGNOSIS — E8881 Metabolic syndrome: Secondary | ICD-10-CM | POA: Insufficient documentation

## 2015-06-08 DIAGNOSIS — E1165 Type 2 diabetes mellitus with hyperglycemia: Secondary | ICD-10-CM | POA: Insufficient documentation

## 2015-06-08 DIAGNOSIS — R9431 Abnormal electrocardiogram [ECG] [EKG]: Secondary | ICD-10-CM | POA: Insufficient documentation

## 2015-06-08 DIAGNOSIS — E1169 Type 2 diabetes mellitus with other specified complication: Secondary | ICD-10-CM | POA: Insufficient documentation

## 2015-06-08 NOTE — Assessment & Plan Note (Signed)
Relatively well-controlled currently. She is only on Maxide which is related to her edema. Mostly because of her diabetes and cardiac risk factors, I would consider the addition of an ACE inhibitor or ARB

## 2015-06-08 NOTE — Assessment & Plan Note (Signed)
Her EKG is somewhat abnormal with some rightward axis deviation and sinus tachycardia. Otherwise no ischemic changes 3 concerned about.  I would be reluctant to do an ischemic evaluation at this time in the absence of true symptoms. We can consider a CPX test at her follow-up visit to establish a baseline of her functional status. I would not be surprised if she has microvascular ischemia.

## 2015-06-08 NOTE — Assessment & Plan Note (Signed)
This does put her at increased cardiovascular risk. We discussed the importance of dietary modification and exercise for weight loss. Aggressive management of her diabetes to get this under control is also very important. Hypertension management and that the management also very important. I don't see that she is on a statin which is probably something that should be considered.

## 2015-06-20 ENCOUNTER — Ambulatory Visit: Payer: Managed Care, Other (non HMO)

## 2015-06-27 ENCOUNTER — Ambulatory Visit: Payer: Managed Care, Other (non HMO)

## 2015-07-04 ENCOUNTER — Ambulatory Visit: Payer: Managed Care, Other (non HMO)

## 2015-10-02 ENCOUNTER — Ambulatory Visit: Payer: Self-pay | Admitting: Allergy and Immunology

## 2015-10-04 ENCOUNTER — Telehealth: Payer: Self-pay | Admitting: Gastroenterology

## 2015-10-04 NOTE — Telephone Encounter (Signed)
Sandy Daniels called with lower abd pain for 2 days.  No fevers or chills, not tender to touch.  She hasn't been taking her linzess as usual and has been constipated, having hard stools.  I recommended she resume her linzess and if pains worsen despite good BMs or if she has fevers or chills to call back and would then likely start empiric abx (has had CT confirmed sigmoid diverticulitis in the past)

## 2015-10-29 ENCOUNTER — Ambulatory Visit (INDEPENDENT_AMBULATORY_CARE_PROVIDER_SITE_OTHER): Payer: 59 | Admitting: Allergy and Immunology

## 2015-10-29 ENCOUNTER — Ambulatory Visit: Payer: Medicare Other | Admitting: Allergy and Immunology

## 2015-10-29 ENCOUNTER — Encounter: Payer: Self-pay | Admitting: Allergy and Immunology

## 2015-10-29 VITALS — BP 146/80 | HR 88 | Temp 98.0°F | Resp 18 | Ht 65.35 in | Wt 287.3 lb

## 2015-10-29 DIAGNOSIS — L309 Dermatitis, unspecified: Secondary | ICD-10-CM

## 2015-10-29 DIAGNOSIS — I10 Essential (primary) hypertension: Secondary | ICD-10-CM | POA: Diagnosis not present

## 2015-10-29 DIAGNOSIS — J310 Chronic rhinitis: Secondary | ICD-10-CM | POA: Diagnosis not present

## 2015-10-29 MED ORDER — AZELASTINE HCL 0.1 % NA SOLN: NASAL | Status: DC

## 2015-10-29 NOTE — Assessment & Plan Note (Signed)
The patient's history suggests insect bites.  I have recommended hiring a professional exterminator for evaluation and eradication if necessary.  For symptom relief I recommended cetirizine 10 mg daily as needed and hydrocortisone 1% cream to affected areas as needed.  If this problem persists or progresses we will evaluate further.

## 2015-10-29 NOTE — Patient Instructions (Addendum)
Chronic rhinitis Non-allergic rhinosinusitis.  All seasonal and perennial aeroallergen skin tests are negative despite a positive histamine control.  Intranasal steroids and intranasal antihistamines are effective for symptoms associated with non-allergic rhinitis, whereas second generation antihistamines such as cetirizine, loratadine and fexofenadine have been found to be ineffective for this condition.  A prescription has been provided for azelastine nasal spray, one spray per nostril 1-2 times daily as needed. Proper nasal spray technique has been discussed and demonstrated.  If needed, she may add fluticasone nasal spray.  Guaifenesin 1200 mg twice daily as needed with adequate hydration as discussed.   Nasal saline lavage (NeilMed) as needed has been recommended along with instructions for proper administration.  Dermatitis The patient's history suggests insect bites.  I have recommended hiring a professional exterminator for evaluation and eradication if necessary.  For symptom relief I recommended cetirizine 10 mg daily as needed and hydrocortisone 1% cream to affected areas as needed.  If this problem persists or progresses we will evaluate further.  Essential hypertension  Sandy Daniels is to follow up with her primary care physician regarding this issue.  She is to avoid systemic decongestants (i.e. pseudoephedrine, phenylephrine) due to hypertension.    Return in about 4 months (around 02/27/2016), or if symptoms worsen or fail to improve.

## 2015-10-29 NOTE — Assessment & Plan Note (Addendum)
   Sandy Daniels is to follow up with her primary care physician regarding this issue.  She is to avoid systemic decongestants (i.e. pseudoephedrine, phenylephrine) due to hypertension.

## 2015-10-29 NOTE — Assessment & Plan Note (Signed)
Non-allergic rhinosinusitis.  All seasonal and perennial aeroallergen skin tests are negative despite a positive histamine control.  Intranasal steroids and intranasal antihistamines are effective for symptoms associated with non-allergic rhinitis, whereas second generation antihistamines such as cetirizine, loratadine and fexofenadine have been found to be ineffective for this condition.  A prescription has been provided for azelastine nasal spray, one spray per nostril 1-2 times daily as needed. Proper nasal spray technique has been discussed and demonstrated.  If needed, she may add fluticasone nasal spray.  Guaifenesin 1200 mg twice daily as needed with adequate hydration as discussed.   Nasal saline lavage (NeilMed) as needed has been recommended along with instructions for proper administration.

## 2015-10-29 NOTE — Progress Notes (Signed)
NEW PATIENT NOTE  RE: Sandy Daniels MRN: [id number] DOB: 1951/05/21 [facility] [place] [address] [place] [address] Date of Office Visit: 10/29/2015  Referring provider: Deland Pretty, MD [address] [place], [place] [address]  Chief Complaint: Nasal Congestion   History of present illness: HPI Comments: Sandy Daniels is a 64 y.o. female presents today for her initial consultation of rhinitis.  She complains of persistent thick postnasal drainage resulting and throat clearing.  She also experiences nasal congestion as well as sinus pressure over the cheekbones and behind the eyes.   No significant seasonal symptom variation has been noted nor have specific environmental triggers been identified.She was prescribed fluticasone nasal spray and cetirizine but admits that she rarely uses these medications because she prefers not to take medications if possible.  She reports that she has 2 sinus infections per year requiring antibiotics on average.Other than coughing with respiratory tract infections or shortness of breath with vigorous physical exertion, the patient denies significant lower respiratory symptoms such as labored breathing, chest tightness, or wheezing. Sandy Daniels also complains of red, itchy bumps on her right forearm and upper arm.  She first noticed them approximately one week ago.  She states that she feels sharp stinging sensation like an insect bite on her arm when sitting in a particular living room chair in the next morning the lesion appears.      Assessment and plan: Chronic rhinitis Non-allergic rhinosinusitis.  All seasonal and perennial aeroallergen skin tests are negative despite a positive histamine control.  Intranasal steroids and intranasal antihistamines are effective for symptoms associated with non-allergic rhinitis, whereas second generation antihistamines such as cetirizine, loratadine and  fexofenadine have been found to be ineffective for this condition.  A prescription has been provided for azelastine nasal spray, one spray per nostril 1-2 times daily as needed. Proper nasal spray technique has been discussed and demonstrated.  If needed, she may add fluticasone nasal spray.  Guaifenesin 1200 mg twice daily as needed with adequate hydration as discussed.   Nasal saline lavage (NeilMed) as needed has been recommended along with instructions for proper administration.  Dermatitis The patient's history suggests insect bites.  I have recommended hiring a professional exterminator for evaluation and eradication if necessary.  For symptom relief I recommended cetirizine 10 mg daily as needed and hydrocortisone 1% cream to affected areas as needed.  If this problem persists or progresses we will evaluate further.  Essential hypertension  Sandy Daniels is to follow up with her primary care physician regarding this issue.  She is to avoid systemic decongestants (i.e. pseudoephedrine, phenylephrine) due to hypertension.    Medications ordered this encounter: Meds ordered this encounter  Medications  . azelastine (ASTELIN) 0.1 % nasal spray    Sig: USE ONE SPRAY IN EACH NOSTRIL ONCE OR TWICE DAILY AS NEEDED    Dispense:  30 mL    Refill:  5    Diagnositics: Allergy skin testing: Epicutaneous and intradermal tests were negative despite a positive histamine control.    Physical examination: Blood pressure 146/80, pulse 88, temperature 98 F (36.7 C), temperature source Oral, resp. rate 18, height 5' 5.35" (1.66 m), weight 287 lb 4.2 oz (130.3 kg).  General: Alert, interactive, in no acute distress. HEENT: TMs pearly gray, turbinates edematous with thick discharge, post-pharynx erythematous. Neck: Supple without lymphadenopathy. Lungs: Clear to auscultation without wheezing, rhonchi or rales. CV: Normal S1, S2 without murmurs. Abdomen:  Nondistended, nontender. Skin: Several  scattered erythematous and excoriated papules on right forearm and upper arm. Extremities:  No clubbing, cyanosis or edema. Neuro:   Grossly intact.  Review of systems: Review of Systems  Constitutional: Negative for fever, chills and weight loss.  HENT: Positive for congestion. Negative for nosebleeds.   Eyes: Negative for blurred vision.  Respiratory: Negative for hemoptysis.   Cardiovascular: Negative for chest pain.  Gastrointestinal: Negative for diarrhea and constipation.  Genitourinary: Negative for dysuria.  Musculoskeletal: Negative for myalgias and joint pain.  Neurological: Negative for dizziness.  Endo/Heme/Allergies: Does not bruise/bleed easily.    Past medical history: Past Medical History  Diagnosis Date  . Hypertension   . Hypothyroidism     On thyroid supplementation  . OA (osteoarthritis)   . Tendonitis of ankle   . Diverticulitis   . Morbid obesity with BMI of 45.0-49.9, adult ([place])   . Vitamin D deficiency   . Anxiety   . Goiter   . History of Helicobacter pylori infection 05/25/2003    gastritis at EGD  . Seborrheic dermatitis   . PVC (premature ventricular contraction)   . Diverticulitis large intestine 01/2013  . Personal history of colonic adenomas 06/12/2013  . Diabetes mellitus without complication [facility])     Past surgical history: Past Surgical History  Procedure Laterality Date  . Esophagogastroduodenoscopy  05/25/2003    H. Pylori, Dr. Jim Desanctis  . Colonoscopy  03/15/2002    internal hemorrhoids, Dr. Jim Desanctis  . Tonsillectomy    . Vaginal hysterectomy    . Nm myoview ltd  10/08/2011    LOW RISK study. EF 68%. Breast attenuation artifact noted otherwise normal study.  . Transthoracic echocardiogram  10/08/2011    Normal study. No valve lesions. Normal LV function, EF >55%, GR 1 DD. No valve lesions    Family history: Family History  Problem Relation Age of Onset  . Dementia Father   . Diabetes Father   . Heart disease Father    . Hypertension Father   . Allergic rhinitis Father   . Heart disease Mother   . Diabetes Mother   . Colon cancer Neg Hx   . Rectal cancer Neg Hx   . Stomach cancer Neg Hx     Social history: Social History   Social History  . Marital Status: Divorced    Spouse Name: N/A  . Number of Children: 1  . Years of Education: N/A   Occupational History  .  [employer]   Social History Main Topics  . Smoking status: Former Smoker    Types: Cigarettes    Quit date: 01/03/2013  . Smokeless tobacco: Never Used  . Alcohol Use: No  . Drug Use: No  . Sexual Activity: Not on file   Other Topics Concern  . Not on file   Social History Narrative   [employer] department   Divorced   1 daughter   Environmental History: The patient lives in a 65 year old house with carpeting throughout and central air/heat.  She is a former cigarette smoker and has no pets.  Known medication allergies: Allergies  Allergen Reactions  . Nexium [Esomeprazole Magnesium] Other (See Comments)    Headache  . Penicillins Hives and Swelling  . Betadine [Povidone Iodine] Rash    Severe bumps and blistering  . Neosporin [Neomycin-Bacitracin Zn-Polymyx] Rash    blistering    Outpatient medications:   Medication List       This list  is accurate as of: 10/29/15  1:51 PM.  Always use your most recent med list.               azelastine 0.1 % nasal spray  Commonly known as:  ASTELIN  USE ONE SPRAY IN EACH NOSTRIL ONCE OR TWICE DAILY AS NEEDED     BD PEN NEEDLE NANO U/F 32G X 4 MM Misc  Generic drug:  Insulin Pen Needle  Inject 5 Units as directed 3 (three) times daily.     blood glucose meter kit and supplies  Dispense based on patient and insurance preference. Use up to four times daily as directed. (FOR ICD-9 250.00, 250.01).     Insulin Glargine 100 UNIT/ML Solostar Pen  Commonly known as:  LANTUS SOLOSTAR  Inject 5 Units into the skin daily at 10 pm.      levothyroxine 100 MCG tablet  Commonly known as:  SYNTHROID, LEVOTHROID  Take 100 mcg by mouth daily before breakfast.     ONE TOUCH ULTRA TEST test strip  Generic drug:  glucose blood  1 strip by Other route 3 (three) times daily.     ONETOUCH DELICA LANCETS 40N Misc  Inject 1 each as directed every evening.     TANZEUM 30 MG Pen  Generic drug:  Albiglutide  USE AS DIRECTED ONCE WEEKLY SQ FOR 90 DAYS     triamterene-hydrochlorothiazide 37.5-25 MG tablet  Commonly known as:  MAXZIDE-25  Take 1 tablet by mouth every morning.        I appreciate the opportunity to take part in this Cleaster's care. Please do not hesitate to contact me with questions.  Sincerely,   R. Edgar Frisk, MD

## 2015-11-02 ENCOUNTER — Encounter (HOSPITAL_COMMUNITY): Payer: Self-pay | Admitting: Emergency Medicine

## 2015-11-02 ENCOUNTER — Emergency Department (INDEPENDENT_AMBULATORY_CARE_PROVIDER_SITE_OTHER)
Admission: EM | Admit: 2015-11-02 | Discharge: 2015-11-02 | Disposition: A | Discharge status: Home / Self Care | Payer: 59 | Source: Home / Self Care | Attending: Family Medicine | Admitting: Family Medicine

## 2015-11-02 DIAGNOSIS — L259 Unspecified contact dermatitis, unspecified cause: Secondary | ICD-10-CM

## 2015-11-02 MED ORDER — TRIAMCINOLONE ACETONIDE 0.1 % EX CREA: 1.0000 "application " | TOPICAL_CREAM | Freq: Two times a day (BID) | CUTANEOUS | Status: DC

## 2015-11-02 MED ORDER — PREDNISONE 20 MG PO TABS: ORAL_TABLET | ORAL | Status: DC

## 2015-11-02 NOTE — Discharge Instructions (Signed)
Contact Dermatitis In addition to the triamcinolone cream and the prednisone and may also taken any histamines such as Claritin, Zyrtec or Allegra to help with itching and rash. If he develop cough, swelling in the mouth or throat or widespread rash, trouble breathing or swelling elsewhere the emergency department. Dermatitis is redness, soreness, and swelling (inflammation) of the skin. Contact dermatitis is a reaction to certain substances that touch the skin. You either touched something that irritated your skin, or you have allergies to something you touched.  HOME CARE  Skin Care  Moisturize your skin as needed.  Apply cool compresses to the affected areas.   Try taking a bath with:   Epsom salts. Follow the instructions on the package. You can get these at a pharmacy or grocery store.   Baking soda. Pour a small amount into the bath as told by your doctor.   Colloidal oatmeal. Follow the instructions on the package. You can get this at a pharmacy or grocery store.   Try applying baking soda paste to your skin. Stir water into baking soda until it looks like paste.  Do not scratch your skin.   Bathe less often.  Bathe in lukewarm water. Avoid using hot water.  Medicines  Take or apply over-the-counter and prescription medicines only as told by your doctor.   If you were prescribed an antibiotic medicine, take or apply your antibiotic as told by your doctor. Do not stop taking the antibiotic even if your condition starts to get better. General Instructions  Keep all follow-up visits as told by your doctor. This is important.   Avoid the substance that caused your reaction. If you do not know what caused it, keep a journal to try to track what caused it. Write down:   What you eat.   What cosmetic products you use.   What you drink.   What you wear in the affected area. This includes jewelry.   If you were given a bandage (dressing), take care of it as  told by your doctor. This includes when to change and remove it.  GET HELP IF:   You do not get better with treatment.   Your condition gets worse.   You have signs of infection such as:  Swelling.  Tenderness.  Redness.  Soreness.  Warmth.   You have a fever.   You have new symptoms.  GET HELP RIGHT AWAY IF:   You have a very bad headache.  You have neck pain.  Your neck is stiff.   You throw up (vomit).   You feel very sleepy.   You see red streaks coming from the affected area.   Your bone or joint underneath the affected area becomes painful after the skin has healed.   The affected area turns darker.   You have trouble breathing.    This information is not intended to replace advice given to you by your health care provider. Make sure you discuss any questions you have with your health care provider.   Document Released: 08/16/2009 Document Revised: 07/10/2015 Document Reviewed: 03/06/2015 Elsevier Interactive Patient Education [employer].

## 2015-11-02 NOTE — ED Notes (Signed)
Here with red raised bumps to both arms that appeared yesterday

## 2015-11-02 NOTE — ED Provider Notes (Signed)
CSN: [id number]     Arrival date & time 11/02/15  1613 History   First MD Initiated Contact with Patient 11/02/15 1748     Chief Complaint  Patient presents with  . Rash   (Consider location/radiation/quality/duration/timing/severity/associated sxs/prior Treatment) HPI Comments: 64 year old morbidly obese female complaining of a swelling to the left side of the anterior neck as well as a itchy rash to the right arm. This started this morning. She is on sure of what may have caused this. She denies cough, dyspnea, swelling within the oropharynx or wheezing. The itching and rash is confined to the above 2 areas.   Past Medical History  Diagnosis Date  . Hypertension   . Hypothyroidism     On thyroid supplementation  . OA (osteoarthritis)   . Tendonitis of ankle   . Diverticulitis   . Morbid obesity with BMI of 45.0-49.9, adult ([place])   . Vitamin D deficiency   . Anxiety   . Goiter   . History of Helicobacter pylori infection 05/25/2003    gastritis at EGD  . Seborrheic dermatitis   . PVC (premature ventricular contraction)   . Diverticulitis large intestine 01/2013  . Personal history of colonic adenomas 06/12/2013  . Diabetes mellitus without complication [facility])    Past Surgical History  Procedure Laterality Date  . Esophagogastroduodenoscopy  05/25/2003    H. Pylori, Dr. Jim Desanctis  . Colonoscopy  03/15/2002    internal hemorrhoids, Dr. Jim Desanctis  . Tonsillectomy    . Vaginal hysterectomy    . Nm myoview ltd  10/08/2011    LOW RISK study. EF 68%. Breast attenuation artifact noted otherwise normal study.  . Transthoracic echocardiogram  10/08/2011    Normal study. No valve lesions. Normal LV function, EF >55%, GR 1 DD. No valve lesions   Family History  Problem Relation Age of Onset  . Dementia Father   . Diabetes Father   . Heart disease Father   . Hypertension Father   . Allergic rhinitis Father   . Heart disease Mother   . Diabetes Mother   . Colon cancer Neg Hx    . Rectal cancer Neg Hx   . Stomach cancer Neg Hx    Social History  Substance Use Topics  . Smoking status: Former Smoker    Types: Cigarettes    Quit date: 01/03/2013  . Smokeless tobacco: Never Used  . Alcohol Use: No   OB History    No data available     Review of Systems  Constitutional: Negative.   HENT: Negative for congestion, ear pain, postnasal drip, rhinorrhea, sore throat and voice change.   Eyes: Negative.   Respiratory: Negative for cough, choking, shortness of breath, wheezing and stridor.   Cardiovascular: Negative for chest pain.  Gastrointestinal: Negative.   Skin: Positive for rash.  Neurological: Negative.     Allergies  Nexium; Penicillins; Betadine; and Neosporin  Home Medications   Prior to Admission medications   Medication Sig Start Date End Date Taking? Authorizing Provider  azelastine (ASTELIN) 0.1 % nasal spray USE ONE SPRAY IN EACH NOSTRIL ONCE OR TWICE DAILY AS NEEDED 10/29/15   Adelina Mings, MD  BD PEN NEEDLE NANO U/F 32G X 4 MM MISC Inject 5 Units as directed 3 (three) times daily. 04/19/15   Historical Provider, MD  blood glucose meter kit and supplies Dispense based on patient and insurance preference. Use up to four times daily as directed. (FOR ICD-9 250.00, 250.01). 04/12/15   [facility]  Wendee Beavers, MD  Insulin Glargine (LANTUS SOLOSTAR) 100 UNIT/ML Solostar Pen Inject 5 Units into the skin daily at 10 pm. 04/12/15   Velvet Bathe, MD  levothyroxine (SYNTHROID, LEVOTHROID) 100 MCG tablet Take 100 mcg by mouth daily before breakfast.    Historical Provider, MD  ONE TOUCH ULTRA TEST test strip 1 strip by Other route 3 (three) times daily. 04/12/15   Historical Provider, MD  Jonetta Speak LANCETS 44W MISC Inject 1 each as directed every evening. 04/12/15   Historical Provider, MD  predniSONE (DELTASONE) 20 MG tablet Take 3 tabs po on first day, 2 tabs second day, 2 tabs third day, 1 tab fourth day, 1 tab 5th day. Take with food. 11/02/15   Janne Napoleon, NP  TANZEUM 30 MG PEN USE AS DIRECTED ONCE WEEKLY SQ FOR 90 DAYS 08/21/15   Historical Provider, MD  triamcinolone cream (KENALOG) 0.1 % Apply 1 application topically 2 (two) times daily. 11/02/15   Janne Napoleon, NP  triamterene-hydrochlorothiazide (MAXZIDE-25) 37.5-25 MG per tablet Take 1 tablet by mouth every morning. 05/14/15   Historical Provider, MD   Meds Ordered and Administered this Visit  Medications - No data to display  BP 180/89 mmHg  Pulse 97  Temp(Src) 98.6 F (37 C) (Oral)  Resp 20  SpO2 99% No data found.   Physical Exam  Constitutional: She is oriented to person, place, and time. She appears well-developed and well-nourished. No distress.  HENT:  Mouth/Throat: Oropharynx is clear and moist.  Oropharynx is clear and moist. There is no swelling or erythema. Airway is widely patent.  Eyes: EOM are normal.  Neck: Normal range of motion. Neck supple.  Left anterior neck there is an elongated area of induration or thickening of the dermis approximately 3-4 cm in length. It is very pruritic. Adjacent to this area is a small papular rash. This is left of the midline. It is a superficial lesion. Does not involve the thyroid or trachea. It is not a lymph node.  Cardiovascular: Normal rate, regular rhythm and normal heart sounds.   Pulmonary/Chest: Effort normal and breath sounds normal. No respiratory distress. She has no wheezes. She has no rales.  Musculoskeletal: Normal range of motion.  Chronic bilateral lower extremity edema. No edema to the upper extremities or neck.  Lymphadenopathy:    She has no cervical adenopathy.  Neurological: She is alert and oriented to person, place, and time. She exhibits normal muscle tone.  Skin: Skin is warm.  On the right upper extremity volar aspect there are longitudinal streaks of papules as well as isolated satellite lesions of papules along the forearm and antecubital fossa.  The lesion to the left neck appear somewhat similar  to that on the right upper extremity. All of these areas are pruritic. No weeping or drainage. No signs of infection.  Psychiatric: Her mood appears anxious.  Nursing note and vitals reviewed.   ED Course  Procedures (including critical care time)  Labs Review Labs Reviewed - No data to display  Imaging Review No results found.   Visual Acuity Review  Right Eye Distance:   Left Eye Distance:   Bilateral Distance:    Right Eye Near:   Left Eye Near:    Bilateral Near:         MDM   1. Contact dermatitis    there is no evidence of a systemic condition. This appears to be a contact dermatitis although consideration for insect bites is possible. In addition to the  triamcinolone cream and the prednisone and may also taken any histamines such as Claritin, Zyrtec or Allegra to help with itching and rash. If he develop cough, swelling in the mouth or throat or widespread rash, trouble breathing or swelling elsewhere the emergency department.     Janne Napoleon, NP 11/02/15 [phone]

## 2015-11-06 ENCOUNTER — Other Ambulatory Visit: Payer: Self-pay | Admitting: Registered Nurse

## 2016-04-06 ENCOUNTER — Telehealth: Payer: Self-pay | Admitting: Internal Medicine

## 2016-04-06 ENCOUNTER — Encounter: Payer: Self-pay | Admitting: Internal Medicine

## 2016-04-06 NOTE — Telephone Encounter (Signed)
Patient reports that she took a linzess on Saturday with no results.  She is advised safe to take daily.  She will try daily and call back if she still has no results.  She is having her colonoscopy on 04/19/16

## 2016-04-06 NOTE — Telephone Encounter (Signed)
Left message for patient to call back  

## 2016-04-14 ENCOUNTER — Telehealth: Payer: Self-pay | Admitting: Internal Medicine

## 2016-04-14 MED ORDER — METRONIDAZOLE 500 MG PO TABS: 500.0000 mg | ORAL_TABLET | Freq: Two times a day (BID) | ORAL | Status: AC

## 2016-04-14 MED ORDER — CIPROFLOXACIN HCL 500 MG PO TABS: 500.0000 mg | ORAL_TABLET | Freq: Two times a day (BID) | ORAL | Status: AC

## 2016-04-14 NOTE — Telephone Encounter (Signed)
Sxs seem like diverticulitis again Cipro and metronidazole Rxed x 10 d Advised taking MiraLax and asked her to update [place] tomorow

## 2016-04-14 NOTE — Telephone Encounter (Signed)
Left message for patient to call back  

## 2016-04-15 NOTE — Telephone Encounter (Signed)
Left message for patient to call back  

## 2016-04-20 ENCOUNTER — Telehealth: Payer: Self-pay | Admitting: Internal Medicine

## 2016-04-20 NOTE — Telephone Encounter (Signed)
Pt states the cipro and flagyl are making her feel sick, burning in her chest and esophagus. States she is not going to take anymore of those meds. Pt states the pain in her abdomen is gone. Pt wanted to know if she could be seen tomorrow. Discussed with her Dr. Mady Gemma is out of the office for 2 weeks and we have no appts available. Discussed with pt that she could try to see her PCP. Pt verbalized understanding.

## 2016-05-20 ENCOUNTER — Emergency Department (HOSPITAL_COMMUNITY)
Admission: EM | Admit: 2016-05-20 | Discharge: 2016-05-20 | Disposition: A | Discharge status: Home / Self Care | Payer: 59 | Attending: Emergency Medicine | Admitting: Emergency Medicine

## 2016-05-20 ENCOUNTER — Encounter (HOSPITAL_COMMUNITY): Payer: Self-pay | Admitting: Emergency Medicine

## 2016-05-20 DIAGNOSIS — Z791 Long term (current) use of non-steroidal anti-inflammatories (NSAID): Secondary | ICD-10-CM | POA: Insufficient documentation

## 2016-05-20 DIAGNOSIS — R300 Dysuria: Secondary | ICD-10-CM | POA: Diagnosis not present

## 2016-05-20 DIAGNOSIS — R000 Tachycardia, unspecified: Secondary | ICD-10-CM | POA: Insufficient documentation

## 2016-05-20 DIAGNOSIS — E119 Type 2 diabetes mellitus without complications: Secondary | ICD-10-CM | POA: Diagnosis not present

## 2016-05-20 DIAGNOSIS — Z79899 Other long term (current) drug therapy: Secondary | ICD-10-CM | POA: Insufficient documentation

## 2016-05-20 DIAGNOSIS — E039 Hypothyroidism, unspecified: Secondary | ICD-10-CM | POA: Insufficient documentation

## 2016-05-20 DIAGNOSIS — I10 Essential (primary) hypertension: Secondary | ICD-10-CM | POA: Diagnosis not present

## 2016-05-20 DIAGNOSIS — Z87891 Personal history of nicotine dependence: Secondary | ICD-10-CM | POA: Insufficient documentation

## 2016-05-20 DIAGNOSIS — M799 Soft tissue disorder, unspecified: Secondary | ICD-10-CM | POA: Diagnosis not present

## 2016-05-20 DIAGNOSIS — R350 Frequency of micturition: Secondary | ICD-10-CM | POA: Diagnosis present

## 2016-05-20 LAB — URINE MICROSCOPIC-ADD ON: RBC / HPF: NONE SEEN RBC/hpf (ref 0–5)

## 2016-05-20 LAB — I-STAT CHEM 8, ED
BUN: 17 mg/dL (ref 6–20)
CHLORIDE: 101 mmol/L (ref 101–111)
Calcium, Ion: 1.22 mmol/L (ref 1.12–1.23)
Creatinine, Ser: 0.9 mg/dL (ref 0.44–1.00)
GLUCOSE: 151 mg/dL — AB (ref 65–99)
HCT: 49 % — ABNORMAL HIGH (ref 36.0–46.0)
Hemoglobin: 16.7 g/dL — ABNORMAL HIGH (ref 12.0–15.0)
Potassium: 3.8 mmol/L (ref 3.5–5.1)
Sodium: 138 mmol/L (ref 135–145)
TCO2: 27 mmol/L (ref 0–100)

## 2016-05-20 LAB — URINALYSIS, ROUTINE W REFLEX MICROSCOPIC
BILIRUBIN URINE: NEGATIVE
Glucose, UA: NEGATIVE mg/dL
Hgb urine dipstick: NEGATIVE
Ketones, ur: NEGATIVE mg/dL
NITRITE: NEGATIVE
PH: 6.5 (ref 5.0–8.0)
Protein, ur: NEGATIVE mg/dL
SPECIFIC GRAVITY, URINE: 1.016 (ref 1.005–1.030)

## 2016-05-20 LAB — CBC WITH DIFFERENTIAL/PLATELET
BASOS ABS: 0 10*3/uL (ref 0.0–0.1)
Basophils Relative: 0 %
EOS PCT: 2 %
Eosinophils Absolute: 0.2 10*3/uL (ref 0.0–0.7)
HCT: 46.2 % — ABNORMAL HIGH (ref 36.0–46.0)
Hemoglobin: 15.4 g/dL — ABNORMAL HIGH (ref 12.0–15.0)
Lymphocytes Relative: 34 %
Lymphs Abs: 4.1 10*3/uL — ABNORMAL HIGH (ref 0.7–4.0)
MCH: 28.3 pg (ref 26.0–34.0)
MCHC: 33.3 g/dL (ref 30.0–36.0)
MCV: 84.9 fL (ref 78.0–100.0)
Monocytes Absolute: 0.9 10*3/uL (ref 0.1–1.0)
Monocytes Relative: 7 %
NEUTROS PCT: 57 %
Neutro Abs: 6.9 10*3/uL (ref 1.7–7.7)
Platelets: 250 10*3/uL (ref 150–400)
RBC: 5.44 MIL/uL — AB (ref 3.87–5.11)
RDW: 14.9 % (ref 11.5–15.5)
WBC: 12 10*3/uL — AB (ref 4.0–10.5)

## 2016-05-20 MED ORDER — PHENAZOPYRIDINE HCL 100 MG PO TABS: 100.0000 mg | ORAL_TABLET | Freq: Three times a day (TID) | ORAL | Status: DC

## 2016-05-20 MED ORDER — PHENAZOPYRIDINE HCL 100 MG PO TABS
100.0000 mg | ORAL_TABLET | ORAL | Status: AC
  Administered 2016-05-20: 100 mg via ORAL
  Filled 2016-05-20: qty 1

## 2016-05-20 NOTE — ED Notes (Signed)
Patient d/c'd self care.  F/U and medication discussed.  Patient verbalized understanding. 

## 2016-05-20 NOTE — ED Notes (Signed)
Patient complaining of left leg swollen. Patient is not having any headaches. Patient is taking a water pill. She states when she urinate it burns. Patient is also experiencing frequent urination and burning while urinating.

## 2016-05-20 NOTE — ED Notes (Signed)
Patient ambulatory to restroom  ?

## 2016-05-20 NOTE — Discharge Instructions (Signed)
Today your evaluated for painful urination.  You do not have a urinary tract infection.  Your lab values are well within normal parameters your kidney function is normal .   You have been given a prescription for medication called primary reading which is a bladder, anesthetic that she continues for symptom relief Please call your physician for follow-up  Dysuria Dysuria is pain or discomfort while urinating. The pain or discomfort may be felt in the tube that carries urine out of the bladder (urethra) or in the surrounding tissue of the genitals. The pain may also be felt in the groin area, lower abdomen, and lower back. You may have to urinate frequently or have the sudden feeling that you have to urinate (urgency). Dysuria can affect both men and women, but is more common in women. Dysuria can be caused by many different things, including:  Urinary tract infection in women.  Infection of the kidney or bladder.  Kidney stones or bladder stones.  Certain sexually transmitted infections (STIs), such as chlamydia.  Dehydration.  Inflammation of the vagina.  Use of certain medicines.  Use of certain soaps or scented products that cause irritation. HOME CARE INSTRUCTIONS Watch your dysuria for any changes. The following actions may help to reduce any discomfort you are feeling:  Drink enough fluid to keep your urine clear or pale yellow.  Empty your bladder often. Avoid holding urine for long periods of time.  After a bowel movement or urination, women should cleanse from front to back, using each tissue only once.  Empty your bladder after sexual intercourse.  Take medicines only as directed by your health care provider.  If you were prescribed an antibiotic medicine, finish it all even if you start to feel better.  Avoid caffeine, tea, and alcohol. They can irritate the bladder and make dysuria worse. In men, alcohol may irritate the prostate.  Keep all follow-up visits as  directed by your health care provider. This is important.  If you had any tests done to find the cause of dysuria, it is your responsibility to obtain your test results. Ask the lab or department performing the test when and how you will get your results. Talk with your health care provider if you have any questions about your results. SEEK MEDICAL CARE IF:  You develop pain in your back or sides.  You have a fever.  You have nausea or vomiting.  You have blood in your urine.  You are not urinating as often as you usually do. SEEK IMMEDIATE MEDICAL CARE IF:  You pain is severe and not relieved with medicines.  You are unable to hold down any fluids.  You or someone else notices a change in your mental function.  You have a rapid heartbeat at rest.  You have shaking or chills.  You feel extremely weak.   This information is not intended to replace advice given to you by your health care provider. Make sure you discuss any questions you have with your health care provider.   Document Released: 07/17/2004 Document Revised: 11/09/2014 Document Reviewed: 06/14/2014 Elsevier Interactive Patient Education [employer].

## 2016-05-20 NOTE — ED Provider Notes (Signed)
CSN: [id number]     Arrival date & time 05/20/16  0205 History   First MD Initiated Contact with Patient 05/20/16 0252     Chief Complaint  Patient presents with  . Leg Swelling  . Urinary Frequency     (Consider location/radiation/quality/duration/timing/severity/associated sxs/prior Treatment) HPI Comments: This is a 65 year old female who presents to the emergency department with dysuria after voiding one time approximate 30 minutes prior to arrival.  She states she takes a water pill and normally urinates a larger volume.  This concerned her, along with having the burning sensation at the end of the urinary stream. She also notes that she's had some bilateral ankle swelling, but she states this is chronic and no worse than usual.  Denies any shortness of breath, chest pain  Patient is a 65 y.o. female presenting with frequency. The history is provided by the patient.  Urinary Frequency This is a new problem. The current episode started today. The problem occurs constantly. The problem has been unchanged. Associated symptoms include urinary symptoms. Pertinent negatives include no chest pain, coughing, fever, nausea or vomiting. Exacerbated by: Urinating. She has tried nothing for the symptoms. The treatment provided no relief.    Past Medical History  Diagnosis Date  . Hypertension   . Hypothyroidism     On thyroid supplementation  . OA (osteoarthritis)   . Tendonitis of ankle   . Diverticulitis   . Morbid obesity with BMI of 45.0-49.9, adult ([place])   . Vitamin D deficiency   . Anxiety   . Goiter   . History of Helicobacter pylori infection 05/25/2003    gastritis at EGD  . Seborrheic dermatitis   . PVC (premature ventricular contraction)   . Diverticulitis large intestine 01/2013  . Personal history of colonic adenomas 06/12/2013  . Diabetes mellitus without complication [facility])    Past Surgical History  Procedure Laterality Date  . Esophagogastroduodenoscopy  05/25/2003   H. Pylori, Dr. Jim Desanctis  . Colonoscopy  03/15/2002    internal hemorrhoids, Dr. Jim Desanctis  . Tonsillectomy    . Vaginal hysterectomy    . Nm myoview ltd  10/08/2011    LOW RISK study. EF 68%. Breast attenuation artifact noted otherwise normal study.  . Transthoracic echocardiogram  10/08/2011    Normal study. No valve lesions. Normal LV function, EF >55%, GR 1 DD. No valve lesions   Family History  Problem Relation Age of Onset  . Dementia Father   . Diabetes Father   . Heart disease Father   . Hypertension Father   . Allergic rhinitis Father   . Heart disease Mother   . Diabetes Mother   . Colon cancer Neg Hx   . Rectal cancer Neg Hx   . Stomach cancer Neg Hx    Social History  Substance Use Topics  . Smoking status: Former Smoker    Types: Cigarettes    Quit date: 01/03/2013  . Smokeless tobacco: Never Used  . Alcohol Use: No   OB History    No data available     Review of Systems  Constitutional: Negative for fever.  Respiratory: Negative for cough and shortness of breath.   Cardiovascular: Positive for leg swelling. Negative for chest pain.  Gastrointestinal: Negative for nausea, vomiting, diarrhea and constipation.  Genitourinary: Positive for dysuria and frequency.  All other systems reviewed and are negative.     Allergies  Nexium; Penicillins; Betadine; and Neosporin  Home Medications   Prior to Admission medications  Medication Sig Start Date End Date Taking? Authorizing Provider  ibuprofen (ADVIL,MOTRIN) 200 MG tablet Take 400 mg by mouth every 6 (six) hours as needed for moderate pain.   Yes Historical Provider, MD  levothyroxine (SYNTHROID, LEVOTHROID) 100 MCG tablet Take 100 mcg by mouth daily before breakfast.   Yes Historical Provider, MD  ranitidine (ZANTAC) 150 MG tablet Take 150 mg by mouth 2 (two) times daily.   Yes Historical Provider, MD  TANZEUM 30 MG PEN USE AS DIRECTED ONCE WEEKLY SQ FOR 90 DAYS 08/21/15  Yes Historical Provider, MD    triamterene-hydrochlorothiazide (MAXZIDE-25) 37.5-25 MG per tablet Take 1 tablet by mouth every morning. 05/14/15  Yes Historical Provider, MD  azelastine (ASTELIN) 0.1 % nasal spray USE ONE SPRAY IN EACH NOSTRIL ONCE OR TWICE DAILY AS NEEDED Patient not taking: Reported on 05/20/2016 10/29/15   Adelina Mings, MD  BD PEN NEEDLE NANO U/F 32G X 4 MM MISC Inject 5 Units as directed 3 (three) times daily. 04/19/15   Historical Provider, MD  blood glucose meter kit and supplies Dispense based on patient and insurance preference. Use up to four times daily as directed. (FOR ICD-9 250.00, 250.01). 04/12/15   Velvet Bathe, MD  Insulin Glargine (LANTUS SOLOSTAR) 100 UNIT/ML Solostar Pen Inject 5 Units into the skin daily at 10 pm. Patient not taking: Reported on 05/20/2016 04/12/15   Velvet Bathe, MD  ONE TOUCH ULTRA TEST test strip 1 strip by Other route 3 (three) times daily. 04/12/15   Historical Provider, MD  Jonetta Speak LANCETS 33I MISC Inject 1 each as directed every evening. 04/12/15   Historical Provider, MD  phenazopyridine (PYRIDIUM) 100 MG tablet Take 1 tablet (100 mg total) by mouth 3 (three) times daily with meals. 05/20/16   Junius Creamer, NP  predniSONE (DELTASONE) 20 MG tablet Take 3 tabs po on first day, 2 tabs second day, 2 tabs third day, 1 tab fourth day, 1 tab 5th day. Take with food. Patient not taking: Reported on 05/20/2016 11/02/15   Janne Napoleon, NP  triamcinolone cream (KENALOG) 0.1 % Apply 1 application topically 2 (two) times daily. Patient not taking: Reported on 05/20/2016 11/02/15   Janne Napoleon, NP   BP 129/73 mmHg  Pulse 82  Temp(Src) 98.1 F (36.7 C) (Oral)  Resp 20  Ht 5' 6" (1.676 m)  Wt 131.997 kg  BMI 46.99 kg/m2  SpO2 100% Physical Exam  Constitutional: She appears well-developed and well-nourished.  Eyes: Pupils are equal, round, and reactive to light.  Neck: Normal range of motion.  Cardiovascular: Regular rhythm.  Tachycardia present.   Pulmonary/Chest:  Effort normal.  Musculoskeletal: She exhibits edema. She exhibits no tenderness.       Legs: Neurological: She is alert.  Skin: Skin is warm.  Nursing note and vitals reviewed.   ED Course  Procedures (including critical care time) Labs Review Labs Reviewed  URINALYSIS, ROUTINE W REFLEX MICROSCOPIC (NOT AT [facility]) - Abnormal; Notable for the following:    Leukocytes, UA SMALL (*)    All other components within normal limits  URINE MICROSCOPIC-ADD ON - Abnormal; Notable for the following:    Squamous Epithelial / LPF 0-5 (*)    Bacteria, UA RARE (*)    All other components within normal limits  CBC WITH DIFFERENTIAL/PLATELET - Abnormal; Notable for the following:    WBC 12.0 (*)    RBC 5.44 (*)    Hemoglobin 15.4 (*)    HCT 46.2 (*)    Lymphs Abs  4.1 (*)    All other components within normal limits  I-STAT CHEM 8, ED - Abnormal; Notable for the following:    Glucose, Bld 151 (*)    Hemoglobin 16.7 (*)    HCT 49.0 (*)    All other components within normal limits    Imaging Review No results found. I have personally reviewed and evaluated these images and lab results as part of my medical decision-making.   EKG Interpretation None    Patient's urine shows no sign of infection.  CBC and i-STAT shows a slightly elevated white count of 12.2, which is very nonspecific.  BUN/creatinine well within normal parameters.  Patient will be discharged home with prescription for Pyridium for comfort and follow up with her primary care physician  MDM   Final diagnoses:  Dysuria         Junius Creamer, NP 05/20/16 7530   Virgel Manifold, MD 05/30/16 2142

## 2016-06-04 ENCOUNTER — Ambulatory Visit (AMBULATORY_SURGERY_CENTER): Payer: 59

## 2016-06-04 ENCOUNTER — Encounter (INDEPENDENT_AMBULATORY_CARE_PROVIDER_SITE_OTHER): Payer: Self-pay

## 2016-06-04 VITALS — Ht 66.5 in | Wt 303.4 lb

## 2016-06-04 DIAGNOSIS — Z860100 Personal history of colon polyps, unspecified: Secondary | ICD-10-CM

## 2016-06-04 NOTE — Progress Notes (Signed)
No allergies to eggs or soy No past problems with anesthesia No diet meds No home oxygen  Declined emmi 

## 2016-06-18 ENCOUNTER — Telehealth: Payer: Self-pay | Admitting: Internal Medicine

## 2016-06-18 NOTE — Telephone Encounter (Signed)
Called pt back, advised pt it was fine to take medications before procedure tomorrow if take by 5am, except not to take metformin. Pt verbalized understanding.-adm

## 2016-06-19 ENCOUNTER — Ambulatory Visit (AMBULATORY_SURGERY_CENTER): Payer: 59 | Admitting: Internal Medicine

## 2016-06-19 ENCOUNTER — Encounter: Payer: Self-pay | Admitting: Internal Medicine

## 2016-06-19 VITALS — BP 127/73 | HR 81 | Temp 98.4°F | Resp 16 | Ht 66.0 in | Wt 303.0 lb

## 2016-06-19 DIAGNOSIS — Z86010 Personal history of colonic polyps: Secondary | ICD-10-CM | POA: Diagnosis present

## 2016-06-19 DIAGNOSIS — D120 Benign neoplasm of cecum: Secondary | ICD-10-CM | POA: Diagnosis not present

## 2016-06-19 LAB — GLUCOSE, CAPILLARY
GLUCOSE-CAPILLARY: 112 mg/dL — AB (ref 65–99)
Glucose-Capillary: 125 mg/dL — ABNORMAL HIGH (ref 65–99)

## 2016-06-19 MED ORDER — SODIUM CHLORIDE 0.9 % IV SOLN: 500.0000 mL | INTRAVENOUS | Status: DC

## 2016-06-19 NOTE — Patient Instructions (Addendum)
I found another polyp and removed it. It was a large one but looked benign.  You still have diverticulosis - thickened muscle rings and pouches in the colon wall. Please read the handout about this condition.  I will let you know pathology results and when to have another routine colonoscopy by mail.   I appreciate the opportunity to care for you. Gatha Mayer, MD, [facility], [place]   Discharge instructions given. Handouts on polyps and diverticulosis. No aspirin,ibuprofen,naproxen, or other non-steroidal anti-inflammatory drugs for 2 weeks. Resume previous medications. YOU HAD AN ENDOSCOPIC PROCEDURE TODAY AT [facility]:   Refer to the procedure report that was given to you for any specific questions about what was found during the examination.  If the procedure report does not answer your questions, please call your gastroenterologist to clarify.  If you requested that your care partner not be given the details of your procedure findings, then the procedure report has been included in a sealed envelope for you to review at your convenience later.  YOU SHOULD EXPECT: Some feelings of bloating in the abdomen. Passage of more gas than usual.  Walking can help get rid of the air that was put into your GI tract during the procedure and reduce the bloating. If you had a lower endoscopy (such as a colonoscopy or flexible sigmoidoscopy) you may notice spotting of blood in your stool or on the toilet paper. If you underwent a bowel prep for your procedure, you may not have a normal bowel movement for a few days.  Please Note:  You might notice some irritation and congestion in your nose or some drainage.  This is from the oxygen used during your procedure.  There is no need for concern and it should clear up in a day or so.  SYMPTOMS TO REPORT IMMEDIATELY:   Following lower endoscopy (colonoscopy or flexible sigmoidoscopy):  Excessive amounts of blood in the stool  Significant  tenderness or worsening of abdominal pains  Swelling of the abdomen that is new, acute  Fever of 100F or higher   For urgent or emergent issues, a gastroenterologist can be reached at any hour by calling [phone].   DIET:  We do recommend a small meal at first, but then you may proceed to your regular diet.  Drink plenty of fluids but you should avoid alcoholic beverages for 24 hours.  ACTIVITY:  You should plan to take it easy for the rest of today and you should NOT DRIVE or use heavy machinery until tomorrow (because of the sedation medicines used during the test).    FOLLOW UP: Our staff will call the number listed on your records the next business day following your procedure to check on you and address any questions or concerns that you may have regarding the information given to you following your procedure. If we do not reach you, we will leave a message.  However, if you are feeling well and you are not experiencing any problems, there is no need to return our call.  We will assume that you have returned to your regular daily activities without incident.  If any biopsies were taken you will be contacted by phone or by letter within the next 1-3 weeks.  Please call us at [phone] if you have not heard about the biopsies in 3 weeks.    SIGNATURES/CONFIDENTIALITY: You and/or your care partner have signed paperwork which will be entered into your electronic medical record.  These  signatures attest to the fact that that the information above on your After Visit Summary has been reviewed and is understood.  Full responsibility of the confidentiality of this discharge information lies with you and/or your care-partner. 

## 2016-06-19 NOTE — Progress Notes (Signed)
Called to room to assist during endoscopic procedure.  Patient ID and intended procedure confirmed with present staff. Received instructions for my participation in the procedure from the performing physician.  

## 2016-06-19 NOTE — Progress Notes (Signed)
A and O x3. Report to RN. Tolerated MAC anesthesia well. 

## 2016-06-19 NOTE — Op Note (Signed)
[place] Patient Name: Sandy Daniels Procedure Date: 06/19/2016 7:39 AM MRN: [id number] Endoscopist: Gatha Mayer , MD Age: 65 Referring MD:  Date of Birth: May 19, 1951 Gender: Female Account #: [id number] Procedure:                Colonoscopy Indications:              Surveillance: Personal history of adenomatous                            polyps on last colonoscopy 3 years ago Medicines:                Propofol per Anesthesia, Monitored Anesthesia Care Procedure:                Pre-Anesthesia Assessment:                           - Prior to the procedure, a History and Physical                            was performed, and patient medications and                            allergies were reviewed. The patient's tolerance of                            previous anesthesia was also reviewed. The risks                            and benefits of the procedure and the sedation                            options and risks were discussed with the patient.                            All questions were answered, and informed consent                            was obtained. Prior Anticoagulants: The patient has                            taken no previous anticoagulant or antiplatelet                            agents. ASA Grade Assessment: III - A patient with                            severe systemic disease. After reviewing the risks                            and benefits, the patient was deemed in                            satisfactory condition to undergo the procedure.  After obtaining informed consent, the colonoscope                            was passed under direct vision. Throughout the                            procedure, the patient's blood pressure, pulse, and                            oxygen saturations were monitored continuously. The                            Model CF-HQ190L [id number]) scope was introduced   through the anus and advanced to the the cecum,                            identified by appendiceal orifice and ileocecal                            valve. The colonoscopy was performed without                            difficulty. The patient tolerated the procedure                            well. The quality of the bowel preparation was                            good. The bowel preparation used was Miralax. The                            ileocecal valve, appendiceal orifice, and rectum                            were photographed. Scope In: 8:03:02 AM Scope Out: 8:20:49 AM Scope Withdrawal Time: 0 hours 14 minutes 49 seconds  Total Procedure Duration: 0 hours 17 minutes 47 seconds  Findings:                 The perianal and digital rectal examinations were                            normal.                           A 25 mm polyp was found in the cecum. The polyp was                            sessile. The polyp was removed with a hot snare.                            Resection and retrieval were complete. To prevent                            bleeding after the  polypectomy, four hemostatic                            clips were successfully placed (MR conditional).                            There was no bleeding during, or at the end, of the                            procedure.                           Multiple diverticula were found in the sigmoid                            colon.                           The exam was otherwise without abnormality on                            direct and retroflexion views. Complications:            No immediate complications. Estimated Blood Loss:     Estimated blood loss was minimal. Impression:               4 adenomas 2014                           - One 25 mm polyp in the cecum, removed with a hot                            snare. Resected and retrieved. Clips (MR                            conditional) were placed.                            - Moderate diverticulosis in the sigmoid colon.                           - The examination was otherwise normal on direct                            and retroflexion views. Recommendation:           - Patient has a contact number available for                            emergencies. The signs and symptoms of potential                            delayed complications were discussed with the                            patient. Return to normal activities tomorrow.  Written discharge instructions were provided to the                            patient.                           - Resume previous diet.                           - Continue present medications.                           - No aspirin, ibuprofen, naproxen, or other                            non-steroidal anti-inflammatory drugs for 2 weeks                            after polyp removal.                           - Repeat colonoscopy is recommended {skip}. The                            colonoscopy date will be determined after pathology                            results from today's exam become available for                            review.                           - Anticipate close follow-up 6-12 months based upon                            size and shape. Gatha Mayer, MD 06/19/2016 8:32:44 AM This report has been signed electronically.

## 2016-06-22 ENCOUNTER — Telehealth: Payer: Self-pay

## 2016-06-22 NOTE — Telephone Encounter (Signed)
  Follow up Call-  Call back number 06/19/2016  Post procedure Call Back phone  # [phone] cell  Permission to leave phone message Yes  Some recent data might be hidden     Patient questions:  Do you have a fever, pain , or abdominal swelling? No. Pain Score  0 *  Have you tolerated food without any problems? Yes.    Have you been able to return to your normal activities? Yes.    Do you have any questions about your discharge instructions: Diet   No. Medications  No. Follow up visit  No.  Do you have questions or concerns about your Care? No.  Actions: * If pain score is 4 or above: No action needed, pain <4.

## 2016-06-29 ENCOUNTER — Encounter: Payer: Self-pay | Admitting: Internal Medicine

## 2016-06-29 DIAGNOSIS — Z86010 Personal history of colonic polyps: Secondary | ICD-10-CM

## 2016-06-29 NOTE — Progress Notes (Signed)
25 mm TV adenoma Recall 47months

## 2016-08-03 ENCOUNTER — Encounter: Payer: Self-pay | Admitting: Gastroenterology

## 2016-08-03 ENCOUNTER — Telehealth: Payer: Self-pay | Admitting: Gastroenterology

## 2016-08-03 MED ORDER — DICYCLOMINE HCL 10 MG PO CAPS: 10.0000 mg | ORAL_CAPSULE | Freq: Three times a day (TID) | ORAL | 0 refills | Status: DC

## 2016-08-03 NOTE — Telephone Encounter (Signed)
Patient called c/o cramping after she took one dose of Linzess, didn't have any bowel movement. She has been constipated for past few days. No fever or chills. Her lower abd cramps. She is afraid to take any more linzess. Advised her to start taking linzess 1 capful BID and then titrate once she starts having  bowel movements to have 1-2 soft BM per day. Sent Rx Bentyl 10mg  q6h prn to pharmacy.  Damaris Hippo , MD

## 2016-10-01 ENCOUNTER — Telehealth: Payer: Self-pay | Admitting: Internal Medicine

## 2016-10-01 MED ORDER — METRONIDAZOLE 500 MG PO TABS: 500.0000 mg | ORAL_TABLET | Freq: Two times a day (BID) | ORAL | 0 refills | Status: DC

## 2016-10-01 MED ORDER — CIPROFLOXACIN HCL 500 MG PO TABS: 500.0000 mg | ORAL_TABLET | Freq: Two times a day (BID) | ORAL | 0 refills | Status: DC

## 2016-10-01 NOTE — Telephone Encounter (Signed)
If it has just been 1 day ? If just her constipation  OK to rx cipro 500 bid and metronidazole 500 bid x 10 d to have on hane but I suggest she try some laxatives if she hasn't and if not getting better then take the Abx and if she fails to resolve let me know

## 2016-10-01 NOTE — Telephone Encounter (Signed)
Patient notified of the recommendations She will try several doses of Miralax tonight.  If this does not help she will pick up rx for antibiotics. She understands to call back if her symptoms fail to improve

## 2016-10-01 NOTE — Telephone Encounter (Signed)
Patient with LLQ abdominal pain, tenderness, and cramping that started yesterday.  She reports that she is having constipation.  She denies fever, nausea or vomiting, rectal bleeding or other complaints.  She feels this pain is consistent with previous episodes of diverticulitis.  She had recent colonoscopy in August with moderate diverticulosis in the sigmoid colon. No APP appts until next week. Please advise

## 2016-11-03 ENCOUNTER — Other Ambulatory Visit: Payer: Self-pay | Admitting: Internal Medicine

## 2016-11-13 ENCOUNTER — Encounter: Payer: Self-pay | Admitting: Internal Medicine

## 2016-12-05 ENCOUNTER — Telehealth: Payer: Self-pay | Admitting: Gastroenterology

## 2016-12-05 MED ORDER — METRONIDAZOLE 500 MG PO TABS: 500.0000 mg | ORAL_TABLET | Freq: Two times a day (BID) | ORAL | 0 refills | Status: DC

## 2016-12-05 MED ORDER — CIPROFLOXACIN HCL 500 MG PO TABS: 500.0000 mg | ORAL_TABLET | Freq: Two times a day (BID) | ORAL | 0 refills | Status: DC

## 2016-12-05 NOTE — Telephone Encounter (Signed)
On call note. Pt with constipation and LLQ pain. Had BM but pain is persisting for several hours. She is concerned that she had diverticulitis again. Advised light diet. Cipro, Flagyl for 7 days. Call of seek care in ED if symptoms worsen.

## 2017-01-08 ENCOUNTER — Ambulatory Visit (AMBULATORY_SURGERY_CENTER): Payer: 59 | Admitting: *Deleted

## 2017-01-08 VITALS — Ht 66.0 in | Wt 304.0 lb

## 2017-01-08 DIAGNOSIS — Z86010 Personal history of colonic polyps: Secondary | ICD-10-CM

## 2017-01-08 NOTE — Progress Notes (Signed)
Patient denies any allergies to eggs or soy. Patient denies any problems with anesthesia/sedation. Patient denies any oxygen use at home and does not take any diet/weight loss medications.  

## 2017-01-11 ENCOUNTER — Encounter: Payer: Self-pay | Admitting: Internal Medicine

## 2017-01-21 ENCOUNTER — Telehealth: Payer: Self-pay | Admitting: Internal Medicine

## 2017-01-21 NOTE — Telephone Encounter (Signed)
Called pt and advised it's OK to take Crestor as far as our procedure is concerned.  Advised pt to be sure and tell admitting nurses about new med.  Pt was more curious to learn why she was put on Crestor since her cholesterol was OK.  She is diabetic also.  Advised to discuss further with prescribing physician. Soma Bachand/PV

## 2017-01-22 ENCOUNTER — Ambulatory Visit (AMBULATORY_SURGERY_CENTER): Payer: 59 | Admitting: Internal Medicine

## 2017-01-22 ENCOUNTER — Encounter: Payer: Self-pay | Admitting: Internal Medicine

## 2017-01-22 VITALS — BP 108/71 | HR 88 | Temp 97.5°F | Resp 21 | Ht 66.0 in | Wt 304.0 lb

## 2017-01-22 DIAGNOSIS — D120 Benign neoplasm of cecum: Secondary | ICD-10-CM

## 2017-01-22 DIAGNOSIS — Z86010 Personal history of colonic polyps: Secondary | ICD-10-CM | POA: Diagnosis present

## 2017-01-22 MED ORDER — SODIUM CHLORIDE 0.9 % IV SOLN: 500.0000 mL | INTRAVENOUS | Status: DC

## 2017-01-22 NOTE — Op Note (Signed)
[place] Patient Name: Sandy Daniels Procedure Date: 01/22/2017 7:54 AM MRN: [id number] Endoscopist: Gatha Mayer , MD Age: 66 Referring MD:  Date of Birth: Nov 16, 1950 Gender: Female Account #: [id number] Procedure:                Colonoscopy Indications:              Surveillance: Piecemeal removal of large sessile                            adenoma last colonoscopy (< 3 yrs) Medicines:                Propofol per Anesthesia, Monitored Anesthesia Care Procedure:                Pre-Anesthesia Assessment:                           - Prior to the procedure, a History and Physical                            was performed, and patient medications and                            allergies were reviewed. The patient's tolerance of                            previous anesthesia was also reviewed. The risks                            and benefits of the procedure and the sedation                            options and risks were discussed with the patient.                            All questions were answered, and informed consent                            was obtained. Prior Anticoagulants: The patient has                            taken no previous anticoagulant or antiplatelet                            agents. ASA Grade Assessment: III - A patient with                            severe systemic disease. After reviewing the risks                            and benefits, the patient was deemed in                            satisfactory condition to undergo the procedure.  After obtaining informed consent, the colonoscope                            was passed under direct vision. Throughout the                            procedure, the patient's blood pressure, pulse, and                            oxygen saturations were monitored continuously. The                            Model CF-HQ190L [id number]) scope was introduced   through the anus and advanced to the the cecum,                            identified by appendiceal orifice and ileocecal                            valve. The colonoscopy was performed without                            difficulty. The patient tolerated the procedure                            well. The quality of the bowel preparation was                            excellent. The ileocecal valve, appendiceal                            orifice, and rectum were photographed. The bowel                            preparation used was Miralax. Scope In: 8:07:20 AM Scope Out: 8:21:34 AM Scope Withdrawal Time: 0 hours 12 minutes 5 seconds  Total Procedure Duration: 0 hours 14 minutes 14 seconds  Findings:                 The perianal and digital rectal examinations were                            normal.                           Two sessile polyps were found in the cecum. The                            polyps were 2 to 3 mm in size. These polyps were                            removed with a cold biopsy forceps. Resection and                            retrieval were complete.  Verification of patient                            identification for the specimen was done. Estimated                            blood loss was minimal.                           A small post polypectomy scar was found in the                            cecum. There was no evidence of the previous polyp.                            Biopsies were taken with a cold forceps for                            histology. Verification of patient identification                            for the specimen was done. Estimated blood loss was                            minimal.                           Multiple diverticula were found in the sigmoid                            colon.                           The exam was otherwise without abnormality on                            direct and retroflexion views. Complications:             No immediate complications. Estimated Blood Loss:     Estimated blood loss was minimal. Impression:               - Two 2 to 3 mm polyps in the cecum, removed with a                            cold biopsy forceps. Resected and retrieved.                           - Post-polypectomy scar in the cecum. Biopsied.                           - Diverticulosis in the sigmoid colon.                           - The examination was otherwise normal on direct  and retroflexion views.                           - Personal history of colonic polyps. Adenomas -                            had 25 mm cecal adenoma removed last year Recommendation:           - Patient has a contact number available for                            emergencies. The signs and symptoms of potential                            delayed complications were discussed with the                            patient. Return to normal activities tomorrow.                            Written discharge instructions were provided to the                            patient.                           - Resume previous diet.                           - Continue present medications.                           - Repeat colonoscopy is recommended for                            surveillance. The colonoscopy date will be                            determined after pathology results from today's                            exam become available for review. Gatha Mayer, MD 01/22/2017 8:31:28 AM This report has been signed electronically.

## 2017-01-22 NOTE — Progress Notes (Signed)
Pt's states no medical or surgical changes since previsit or office visit. 

## 2017-01-22 NOTE — Patient Instructions (Addendum)
    I found and removed 2 tiny polyps. The big polyp looks gone - I checked that spot with biopsies.  I appreciate the opportunity to care for you. Gatha Mayer, MD, [facility]  Handout given on polyps  YOU HAD AN ENDOSCOPIC PROCEDURE TODAY: Refer to the procedure report and other information in the discharge instructions given to you for any specific questions about what was found during the examination. If this information does not answer your questions, please call [place] office at [phone] to clarify.   YOU SHOULD EXPECT: Some feelings of bloating in the abdomen. Passage of more gas than usual. Walking can help get rid of the air that was put into your GI tract during the procedure and reduce the bloating. If you had a lower endoscopy (such as a colonoscopy or flexible sigmoidoscopy) you may notice spotting of blood in your stool or on the toilet paper. Some abdominal soreness may be present for a day or two, also.  DIET: Your first meal following the procedure should be a light meal and then it is ok to progress to your normal diet. A half-sandwich or bowl of soup is an example of a good first meal. Heavy or fried foods are harder to digest and may make you feel nauseous or bloated. Drink plenty of fluids but you should avoid alcoholic beverages for 24 hours. If you had a esophageal dilation, please see attached instructions for diet.    ACTIVITY: Your care partner should take you home directly after the procedure. You should plan to take it easy, moving slowly for the rest of the day. You can resume normal activity the day after the procedure however YOU SHOULD NOT DRIVE, use power tools, machinery or perform tasks that involve climbing or major physical exertion for 24 hours (because of the sedation medicines used during the test).   SYMPTOMS TO REPORT IMMEDIATELY: A gastroenterologist can be reached at any hour. Please call [phone]  for any of the following symptoms:  Following  lower endoscopy (colonoscopy, flexible sigmoidoscopy) Excessive amounts of blood in the stool  Significant tenderness, worsening of abdominal pains  Swelling of the abdomen that is new, acute  Fever of 100 or higher    FOLLOW UP:  If any biopsies were taken you will be contacted by phone or by letter within the next 1-3 weeks. Call [phone]  if you have not heard about the biopsies in 3 weeks.  Please also call with any specific questions about appointments or follow up tests.

## 2017-01-22 NOTE — Progress Notes (Signed)
A/ox3 pleased with MAC, report to Megan RN 

## 2017-01-23 ENCOUNTER — Telehealth: Payer: Self-pay | Admitting: Internal Medicine

## 2017-01-23 NOTE — Telephone Encounter (Signed)
The patient called a short while ago with concerns of recurrent diverticulitis. Chart reviewed. She has a history of the same documented by CT and treated with Cipro and Flagyl. In any event, underwent her teens surveillance colonoscopy yesterday. Diminutive polyps removed with cold biopsy forceps. Left-sided diverticulosis without diverticulitis noted. She had no difficulties post procedure or last evening. This morning reports 3 out of 10 lower abdominal discomfort consistent with previous episodes of diverticulitis. No fever or other complaints. I do not feel that this is related to her colonoscopy. I gave her the option of empiric antibiotics for possible early diverticulitis or ongoing monitoring. She preferred antibiotics but wanted to avoid Flagyl (severe nausea). Thus, I prescribed ciprofloxacin 500 mg twice a day for one week. Called into [facility]. I asked her to contact our office Monday morning with an update in her condition to see if she might need to be seen. I told her that I would forward this note to Dr. Carlean Purl and as well as his nurse Barbera Setters please follow-up on this patient if you do not hear from her. Thanks)

## 2017-01-25 ENCOUNTER — Telehealth: Payer: Self-pay

## 2017-01-25 NOTE — Telephone Encounter (Signed)
  Follow up Call-  Call back number 01/22/2017 06/19/2016  Post procedure Call Back phone  # [phone] WORK NUMBER/CELL [phone] ABLE TO [place] PHONE [phone] cell  Permission to leave phone message No Yes  Some recent data might be hidden     Patient questions:  Do you have a fever, pain , or abdominal swelling? No. Pain Score  0 *  Have you tolerated food without any problems? Yes.    Have you been able to return to your normal activities? Yes.    Do you have any questions about your discharge instructions: Diet   No. Medications  No. Follow up visit  No.  Do you have questions or concerns about your Care? No.  Actions: * If pain score is 4 or above: No action needed, pain <4.

## 2017-01-25 NOTE — Telephone Encounter (Signed)
Left message for patient to call back  

## 2017-01-28 NOTE — Telephone Encounter (Signed)
The pt states she is doing great now.  She will call if her symptoms return.

## 2017-02-02 ENCOUNTER — Encounter: Payer: Self-pay | Admitting: Internal Medicine

## 2017-02-02 DIAGNOSIS — Z86010 Personal history of colonic polyps: Secondary | ICD-10-CM

## 2017-02-02 NOTE — Progress Notes (Signed)
2 tiny adenomas No residual polyp  Recall 3 yrs

## 2017-04-30 ENCOUNTER — Ambulatory Visit (INDEPENDENT_AMBULATORY_CARE_PROVIDER_SITE_OTHER): Payer: 59 | Admitting: Cardiology

## 2017-04-30 ENCOUNTER — Encounter: Payer: Self-pay | Admitting: Cardiology

## 2017-04-30 VITALS — BP 150/70 | HR 92 | Ht 66.0 in | Wt 302.0 lb

## 2017-04-30 DIAGNOSIS — I10 Essential (primary) hypertension: Secondary | ICD-10-CM

## 2017-04-30 DIAGNOSIS — E8881 Metabolic syndrome: Secondary | ICD-10-CM

## 2017-04-30 DIAGNOSIS — R600 Localized edema: Secondary | ICD-10-CM | POA: Diagnosis not present

## 2017-04-30 DIAGNOSIS — R9431 Abnormal electrocardiogram [ECG] [EKG]: Secondary | ICD-10-CM

## 2017-04-30 MED ORDER — FUROSEMIDE 40 MG PO TABS: 40.0000 mg | ORAL_TABLET | ORAL | 11 refills | Status: DC

## 2017-04-30 MED ORDER — VALSARTAN 40 MG PO TABS: 40.0000 mg | ORAL_TABLET | Freq: Every day | ORAL | 6 refills | Status: DC

## 2017-04-30 NOTE — Progress Notes (Signed)
PCP: Deland Pretty, MD  Clinic Note: Chief Complaint  Patient presents with  . Follow-up    Cardiac risk factors of hypertension, obesity, and diabetes    HPI: Sandy Daniels is a 66 y.o. female with a PMH below who presents today for A roughly two-year follow-up.  She had been referred by Dr. Shelia Media for evaluation given her cardiac risk factors of hypertension, obesity and diabetes. She has chronic lower TIMI edema since her frequent ankle fractures years ago. She was initially referred for an abnormal EKG but had a Myoview and echocardiogram performed several relatively normal.  Sandy Daniels was last seen On June 05, 2015 -> this is a follow-up after hospitalization where she was noted to be in DKA with right leg cellulitis from UTI and sepsis. Apparently that was when her diabetes was first diagnosed when she is in DKA/HONK.    Recent Hospitalizations: None  Studies Personally Reviewed - (if available, images/films reviewed: From Epic Chart or Care Everywhere)  None  Interval History: Sandy Daniels returns today overall doing quite well from a cardiac standpoint. She is very happy that she received an award for her service on [employer]. During the last walk, she was able to walk full mile without stopping. This was a great achievement for her. She's been trying to do walking more now and that was very reassuring her that she is doing well. Not unexpectedly, she is usually getting short of breath when she walks, but is able to start walking to it now. Not having any chest tightness or pressure when she walks she doesn't really have PND orthopnea but she still has bilateral lower extremity edema that is pretty significant.   Otherwise, she denies any palpitations, lightheadedness, dizziness, weakness or syncope/near syncope. No TIA/amaurosis fugax symptoms. No melena, hematochezia, hematuria, or epstaxis. No claudication.  ROS: A comprehensive was performed. Review of Systems    Constitutional: Negative for malaise/fatigue.  HENT: Negative for congestion and nosebleeds.   Eyes: Negative for blurred vision.  Respiratory: Positive for shortness of breath (Per history of present illness). Negative for cough and wheezing.   Cardiovascular: Positive for leg swelling.  Gastrointestinal: Negative for abdominal pain, constipation and heartburn.  Genitourinary: Positive for urgency. Negative for dysuria.  Musculoskeletal: Positive for back pain and joint pain. Negative for falls.  Neurological: Negative for dizziness.  Endo/Heme/Allergies: Negative for environmental allergies.  Psychiatric/Behavioral: Negative for depression and memory loss. The patient is nervous/anxious. The patient does not have insomnia.   All other systems reviewed and are negative.  I have reviewed and (if needed) personally updated the patient's problem list, medications, allergies, past medical and surgical history, social and family history.   Past Medical History:  Diagnosis Date  . Anxiety   . Diabetes mellitus without complication ([name])   . Diverticulitis large intestine 01/2013  . GERD (gastroesophageal reflux disease)   . Goiter   . History of Helicobacter pylori infection 05/25/2003   gastritis at EGD  . Hypertension   . Hypothyroidism    On thyroid supplementation  . Morbid obesity with BMI of 45.0-49.9, adult ([place])   . OA (osteoarthritis)   . Personal history of colonic adenomas 06/12/2013  . Seborrheic dermatitis   . Tendonitis of ankle   . Vitamin D deficiency     Past Surgical History:  Procedure Laterality Date  . COLONOSCOPY  03/15/2002   internal hemorrhoids, Dr. Jim Desanctis  . ESOPHAGOGASTRODUODENOSCOPY  05/25/2003   H. Pylori, Dr. Jim Desanctis  .  NM MYOVIEW LTD  10/08/2011   LOW RISK study. EF 68%. Breast attenuation artifact noted otherwise normal study.  . TONSILLECTOMY    . TRANSTHORACIC ECHOCARDIOGRAM  10/08/2011   Normal study. No valve lesions. Normal LV  function, EF >55%, GR 1 DD. No valve lesions  . UPPER GASTROINTESTINAL ENDOSCOPY    . VAGINAL HYSTERECTOMY      Current Meds  Medication Sig  . aspirin 81 MG EC tablet Take 81 mg by mouth daily.  . BD PEN NEEDLE NANO U/F 32G X 4 MM MISC Inject 5 Units as directed 3 (three) times daily.  . Dulaglutide (TRULICITY) 1.5 TD/9.7CB SOPN Inject 1 Dose into the skin once a week.  Marland Kitchen ibuprofen (ADVIL,MOTRIN) 200 MG tablet Take 400 mg by mouth every 6 (six) hours as needed for moderate pain.  Marland Kitchen levothyroxine (SYNTHROID, LEVOTHROID) 100 MCG tablet Take 100 mcg by mouth daily before breakfast.  . LINZESS 290 MCG CAPS capsule TAKE 1 CAPSULE (290 MCG TOTAL) BY MOUTH DAILY.  . metFORMIN (GLUCOPHAGE) 500 MG tablet Take 500 mg by mouth 2 (two) times daily with a meal.  . ranitidine (ZANTAC) 150 MG tablet Take 150 mg by mouth 2 (two) times daily.  . rosuvastatin (CRESTOR) 10 MG tablet Take 10 mg by mouth daily.  Marland Kitchen triamterene-hydrochlorothiazide (MAXZIDE-25) 37.5-25 MG per tablet Take 1 tablet by mouth every morning.  . TRULICITY 6.38 GT/3.6IW SOPN INJECT 0.75 MG ONCE A WEEK SQ   Current Facility-Administered Medications for the 04/30/17 encounter (Office Visit) with Leonie Man, MD  Medication  . 0.9 %  sodium chloride infusion    Allergies  Allergen Reactions  . Betadine [Povidone Iodine] Rash    Severe bumps and blistering  . Neosporin [Neomycin-Bacitracin Zn-Polymyx] Rash    blistering  . Nexium [Esomeprazole Magnesium] Other (See Comments)    Headache  . Penicillins Hives and Swelling    Social History   Social History  . Marital status: Divorced    Spouse name: N/A  . Number of children: 1  . Years of education: N/A   Occupational History  .  [employer]   Social History Main Topics  . Smoking status: Former Smoker    Types: Cigarettes    Quit date: 01/03/2013  . Smokeless tobacco: Never Used  . Alcohol use No  . Drug use: No  . Sexual activity: Not Asked   Other Topics  Concern  . None   Social History Narrative   [employer]   Divorced   1 daughter    family history includes Allergic rhinitis in her father; Dementia in her father; Diabetes in her father and mother; Heart disease in her father and mother; Hypertension in her father.  Wt Readings from Last 3 Encounters:  04/30/17 (!) 302 lb (137 kg)  01/22/17 (!) 304 lb (137.9 kg)  01/08/17 (!) 304 lb (137.9 kg)    PHYSICAL EXAM BP (!) 150/70 (BP Location: Right Arm, Patient Position: Sitting, Cuff Size: Normal)   Pulse 92   Ht 5\' 6"  (1.676 m)   Wt (!) 302 lb (137 kg)   BMI 48.74 kg/m  General appearance: alert, cooperative, appears stated age, no distress. Morbidly obese;  HEENT: [place]/AT, EOMI, MMM, anicteric sclera Neck: no adenopathy, no carotid bruit and no JVD Lungs: clear to auscultation bilaterally, normal percussion bilaterally and non-labored Heart: regular rate and rhythm, S1 &S2 normal, no murmur, click, rub or gallop; unable to palpate PMI due to body  habitus Abdomen: soft, non-tender; bowel sounds normal; no masses,  unable to palpate HJR or HSM Extremities: extremities normal, atraumatic, no cyanosis; Edema: 3-4+ bilaterally up to just above the knee. Pulses: 2+ and symmetric; unable to palpate pedal pulses because of edema Skin: no evidence of bleeding or bruising, no lesions noted and temperature normal; thick/tough skin with dense edema. Neurologic: Mental status: Alert & oriented x 3, thought content appropriate; non-focal exam.  Pleasant mood & affect - but very anxious and nervous   Adult ECG Report  Rate: 92 ;  Rhythm: normal sinus rhythm and Right axis deviation. Otherwise normal intervals and durations.;   Narrative Interpretation: Compared to last EKG, rate is slower. Otherwise no significant change.   Other studies Reviewed: Additional studies/ records that were reviewed today include:  Recent Labs:  Recently checked by Dr. Shelia Media. Not  currently available @ time of vistit  ASSESSMENT / PLAN: Problem List Items Addressed This Visit    Abnormal finding on EKG (Chronic)    Mostly right axis deviation noted which is stable. No real abnormalities noted on echocardiogram in the past. Wouldn't doubt that she has some potential deformity pressures from obesity.      Relevant Orders   EKG 12-Lead   Bilateral edema of lower extremity - Primary (Chronic)    She has been on Maxide alone for blood pressure control which is no longer seemingly effective. It is also not helping with her edema. I think we need to give her a little bit more true diuretic, but I'm leery of adjusting blood pressure medications. Plan for now will be to switch her diuretic to every other day alternating Lasix for Maxide. I'm also been having valsartan for blood pressure control.  Quite likely there is some component of venous stasis as well. Plan: Elevate legs when possible and suggested support stockings.      Essential hypertension (Chronic)    Blood pressure is quite elevated today, but she says usually it is much better than this at home. She is very anxious today, and had not yet taken her medication until she arrived here.  She states that she is always anxious and nervous when she comes the doctor's office. She is only on triamterene HCTZ, and I think she probably would need some additional blood pressure control.  Plan: Start valsartan and low-dose (40 mg) - hopefully as we titrated up for better support.  With her increased edema, Maxide does not seem to be quite effective. We will alternate Maxide and furosemid know what e every other day.  Starting furosemide 40 mg every other day      Relevant Medications   valsartan (DIOVAN) 40 MG tablet   furosemide (LASIX) 40 MG tablet   Other Relevant Orders   EKG 33-ASNK   Metabolic syndrome (Chronic)    Combination of obesity, diabetes and hypertension. 4 metabolic syndrome. This puts her at  increased risk for cardiac disease. Aggressive glycemic control as well as lipid management is warranted - current A1c is 6.5 which is notably improved from 7.4 last year. The panel looks great. For sleep, her blood pressure and weight are still an issue.         Current medicines are reviewed at length with the patient today. (+/- concerns) n/a The following changes have been made: see below  Patient Instructions  MEDICATION CHANGE  START VALSARTAN 40 MG  ONE TABLET DAILY  ALTERNATE EVERY OTHER DAY FUROSEMIDE 40 MG  AND MAXZIDE  37.5/25 MG  RECOMMEND ELEVATE LEGS WEAR SUPPORT STOCKING  DURING THE DAY  AND REMOVE AT NIGHT    Your physician recommends that you schedule a follow-up appointment in [place].    Studies Ordered:   Orders Placed This Encounter  Procedures  . EKG 12-Lead     Glenetta Hew, M.D., M.S. Interventional Cardiologist   Pager # [phone] Phone # [phone] [address] [place], [place] [address]  ADDENDUM: LABS RECEIVED @ END OF DAY.  Recent Labs: 03/05/2016  Na+ 140, K+ 4.3, Cl- 98, HCO3- 24 , BUN 13, Cr 0.98, Glu 137, Ca2+ 10.1; AST 12, ALT, 13, AlkP 81  TC 101, TG 121, HDL  42, LDL  35

## 2017-04-30 NOTE — Patient Instructions (Addendum)
MEDICATION CHANGE  START VALSARTAN 40 MG  ONE TABLET DAILY  ALTERNATE EVERY OTHER DAY FUROSEMIDE 40 MG  AND MAXZIDE  37.5/25 MG   RECOMMEND ELEVATE LEGS WEAR SUPPORT STOCKING  DURING THE DAY  AND REMOVE AT NIGHT    Your physician recommends that you schedule a follow-up appointment in [place].

## 2017-05-02 ENCOUNTER — Encounter: Payer: Self-pay | Admitting: Cardiology

## 2017-05-02 DIAGNOSIS — R600 Localized edema: Secondary | ICD-10-CM | POA: Insufficient documentation

## 2017-05-02 NOTE — Assessment & Plan Note (Signed)
Mostly right axis deviation noted which is stable. No real abnormalities noted on echocardiogram in the past. Wouldn't doubt that she has some potential deformity pressures from obesity.

## 2017-05-02 NOTE — Assessment & Plan Note (Signed)
Combination of obesity, diabetes and hypertension. 4 metabolic syndrome. This puts her at increased risk for cardiac disease. Aggressive glycemic control as well as lipid management is warranted - current A1c is 6.5 which is notably improved from 7.4 last year. The panel looks great. For sleep, her blood pressure and weight are still an issue.

## 2017-05-02 NOTE — Assessment & Plan Note (Signed)
Blood pressure is quite elevated today, but she says usually it is much better than this at home. She is very anxious today, and had not yet taken her medication until she arrived here.  She states that she is always anxious and nervous when she comes the doctor's office. She is only on triamterene HCTZ, and I think she probably would need some additional blood pressure control.  Plan: Start valsartan and low-dose (40 mg) - hopefully as we titrated up for better support.  With her increased edema, Maxide does not seem to be quite effective. We will alternate Maxide and furosemid know what e every other day.  Starting furosemide 40 mg every other day

## 2017-05-02 NOTE — Assessment & Plan Note (Signed)
She has been on Maxide alone for blood pressure control which is no longer seemingly effective. It is also not helping with her edema. I think we need to give her a little bit more true diuretic, but I'm leery of adjusting blood pressure medications. Plan for now will be to switch her diuretic to every other day alternating Lasix for Maxide. I'm also been having valsartan for blood pressure control.  Quite likely there is some component of venous stasis as well. Plan: Elevate legs when possible and suggested support stockings.

## 2017-08-05 ENCOUNTER — Encounter: Payer: Self-pay | Admitting: Cardiology

## 2017-08-05 ENCOUNTER — Ambulatory Visit (INDEPENDENT_AMBULATORY_CARE_PROVIDER_SITE_OTHER): Payer: 59 | Admitting: Cardiology

## 2017-08-05 VITALS — BP 126/78 | HR 96 | Ht 66.0 in | Wt 300.0 lb

## 2017-08-05 DIAGNOSIS — E8881 Metabolic syndrome: Secondary | ICD-10-CM | POA: Diagnosis not present

## 2017-08-05 DIAGNOSIS — I10 Essential (primary) hypertension: Secondary | ICD-10-CM

## 2017-08-05 DIAGNOSIS — R600 Localized edema: Secondary | ICD-10-CM | POA: Diagnosis not present

## 2017-08-05 DIAGNOSIS — Z23 Encounter for immunization: Secondary | ICD-10-CM

## 2017-08-05 MED ORDER — FUROSEMIDE 40 MG PO TABS: 40.0000 mg | ORAL_TABLET | Freq: Every day | ORAL | 6 refills | Status: DC | PRN

## 2017-08-05 NOTE — Progress Notes (Signed)
PCP: Deland Pretty, MD  Clinic Note: Chief Complaint  Patient presents with  . Follow-up    3 months  . Hypertension    HPI: Sandy Daniels is a 66 y.o. female with a PMH below who presents today for 4 month f/u follow-up for DOE & HTN management.   She had been referred by Dr. Shelia Media for evaluation given her cardiac risk factors of hypertension, obesity and diabetes. She has chronic lower TIMI edema since her frequent ankle fractures years ago. She was initially referred for an abnormal EKG but had a Myoview and echocardiogram performed several relatively normal.   Sandy Daniels was last seen On 04/30/2017 to " re-establish cardiology care" for HTN. Plan: Start valsartan and low-dose (40 mg) - hopefully will be tiitrated up for better support.  With her increased edema, Maxide does not seem to be quite effective. We will alternate Maxide and furosemid know what e every other day.   Starting furosemide 40 mg every other day  ELEVATE LEGS;' WEAR SUPPORT STOCKING DURING THE DAY AND REMOVE AT NIGHT  Recent Hospitalizations: None  Studies Personally Reviewed - (if available, images/films reviewed: From Epic Chart or Care Everywhere)  None  Interval History: Sandy Daniels returns today overall doing quite well from a cardiac standpoint.  She had a lot of stress during the timeframe of her June visit. She was dealing with several family deaths, and also has recently been told that her young cousin was recently diagnosed with stage IV ovarian cancer. She never did start the valsartan that was prescribed last time. She also never filled the furosemide that was prescribed for edema. She continues to be very happy with being rewarded for her service to the [employer]. She still doing well as far as diet and exercise working on trying to lose weight. Sure blood pressures a very stable of late despite having not started the valsartan. She is not noticing any significant exertional dyspnea  and certainly no chest tightness or pressure. No PND orthopnea but does have some mild edema.  She denies any significant lightheadedness, dizziness or wooziness. No syncope/near syncope or TIA/amaurosis fugax symptoms. No claudication.  Otherwise, she denies any palpitations, lightheadedness, dizziness, weakness or syncope/near syncope. No TIA/amaurosis fugax symptoms. No melena, hematochezia, hematuria, or epstaxis. No claudication.  ROS: A comprehensive was performed. Review of Systems  Constitutional: Negative for malaise/fatigue.  HENT: Negative for congestion and nosebleeds.   Eyes: Negative for blurred vision.  Respiratory: Positive for shortness of breath (Per history of present illness). Negative for cough and wheezing.   Cardiovascular: Positive for leg swelling.  Gastrointestinal: Negative for abdominal pain, constipation and heartburn.  Genitourinary: Positive for urgency. Negative for dysuria.  Musculoskeletal: Positive for back pain and joint pain. Negative for falls.  Neurological: Negative for dizziness.  Endo/Heme/Allergies: Negative for environmental allergies.  Psychiatric/Behavioral: Negative for depression and memory loss. The patient is nervous/anxious. The patient does not have insomnia.   All other systems reviewed and are negative.  I have reviewed and (if needed) personally updated the patient's problem list, medications, allergies, past medical and surgical history, social and family history.   Past Medical History:  Diagnosis Date  . Anxiety   . Diabetes mellitus without complication ([place])   . Diverticulitis large intestine 01/2013  . GERD (gastroesophageal reflux disease)   . Goiter   . History of Helicobacter pylori infection 05/25/2003   gastritis at EGD  . Hypertension   . Hypothyroidism    On  thyroid supplementation  . Morbid obesity with BMI of 45.0-49.9, adult ([place])   . OA (osteoarthritis)   . Personal history of colonic adenomas 06/12/2013    . Seborrheic dermatitis   . Tendonitis of ankle   . Vitamin D deficiency     Past Surgical History:  Procedure Laterality Date  . COLONOSCOPY  03/15/2002   internal hemorrhoids, Dr. Jim Desanctis  . ESOPHAGOGASTRODUODENOSCOPY  05/25/2003   H. Pylori, Dr. Jim Desanctis  . NM MYOVIEW LTD  10/08/2011   LOW RISK study. EF 68%. Breast attenuation artifact noted otherwise normal study.  . TONSILLECTOMY    . TRANSTHORACIC ECHOCARDIOGRAM  10/08/2011   Normal study. No valve lesions. Normal LV function, EF >55%, GR 1 DD. No valve lesions  . UPPER GASTROINTESTINAL ENDOSCOPY    . VAGINAL HYSTERECTOMY      Current Meds  Medication Sig  . aspirin 81 MG EC tablet Take 81 mg by mouth daily.  . Dulaglutide (TRULICITY) 1.5 UU/7.2ZD SOPN Inject 1 Dose into the skin once a week.  Marland Kitchen ibuprofen (ADVIL,MOTRIN) 200 MG tablet Take 400 mg by mouth every 6 (six) hours as needed for moderate pain.  Marland Kitchen levothyroxine (SYNTHROID, LEVOTHROID) 100 MCG tablet Take 100 mcg by mouth daily before breakfast.  . LINZESS 290 MCG CAPS capsule TAKE 1 CAPSULE (290 MCG TOTAL) BY MOUTH DAILY.  . metFORMIN (GLUCOPHAGE) 500 MG tablet Take 500 mg by mouth 2 (two) times daily with a meal.  . ranitidine (ZANTAC) 150 MG tablet Take 150 mg by mouth 2 (two) times daily.  . rosuvastatin (CRESTOR) 10 MG tablet Take 10 mg by mouth daily.  Marland Kitchen triamterene-hydrochlorothiazide (DYAZIDE) 37.5-25 MG capsule Take 1 capsule by mouth every morning.  . [DISCONTINUED] BD PEN NEEDLE NANO U/F 32G X 4 MM MISC Inject 5 Units as directed 3 (three) times daily.  . [DISCONTINUED] TRULICITY 6.64 QI/3.4VQ SOPN INJECT 0.75 MG ONCE A WEEK SQ  . [DISCONTINUED] valsartan (DIOVAN) 40 MG tablet Take 1 tablet (40 mg total) by mouth daily.   Current Facility-Administered Medications for the 08/05/17 encounter (Office Visit) with Leonie Man, MD  Medication  . 0.9 %  sodium chloride infusion    Allergies  Allergen Reactions  . Betadine [Povidone Iodine] Rash     Severe bumps and blistering  . Neosporin [Neomycin-Bacitracin Zn-Polymyx] Rash    blistering  . Nexium [Esomeprazole Magnesium] Other (See Comments)    Headache  . Penicillins Hives and Swelling    Social History   Social History  . Marital status: Divorced    Spouse name: N/A  . Number of children: 1  . Years of education: N/A   Occupational History  .  [employer]   Social History Main Topics  . Smoking status: Former Smoker    Types: Cigarettes    Quit date: 01/03/2013  . Smokeless tobacco: Never Used  . Alcohol use No  . Drug use: No  . Sexual activity: Not Asked   Other Topics Concern  . None   Social History Narrative   [employer]   Divorced   1 daughter    family history includes Allergic rhinitis in her father; Dementia in her father; Diabetes in her father and mother; Heart disease in her father and mother; Hypertension in her father.  Wt Readings from Last 3 Encounters:  08/05/17 300 lb (136.1 kg)  04/30/17 (!) 302 lb (137 kg)  01/22/17 (!) 304 lb (137.9 kg)    PHYSICAL  EXAM BP 126/78   Pulse 96   Ht 5\' 6"  (1.676 m)   Wt 300 lb (136.1 kg)   BMI 48.42 kg/m  Physical Exam  Constitutional: She is oriented to person, place, and time. She appears well-developed and well-nourished. No distress.  Morbidly obese  HENT:  Head: Normocephalic and atraumatic.  Neck: No JVD present. Carotid bruit is not present.  Cardiovascular: Normal rate, regular rhythm, normal heart sounds and intact distal pulses.   No extrasystoles are present. PMI is not displaced.  Exam reveals no gallop and no friction rub.   No murmur heard. Pulmonary/Chest: Breath sounds normal. No respiratory distress. She has no wheezes.  Abdominal: Soft. Bowel sounds are normal. She exhibits no distension. There is no tenderness. There is no rebound.  Musculoskeletal: Normal range of motion. She exhibits no edema (Trivial).  Neurological: She is alert and  oriented to person, place, and time.  Skin: Skin is warm and dry.  Psychiatric: She has a normal mood and affect. Her behavior is normal. Judgment and thought content normal.  Nursing note and vitals reviewed.    Adult ECG Report  not checked.  Other studies Reviewed: Additional studies/ records that were reviewed today include:  Recent Labs:  n/a  ASSESSMENT / PLAN: Problem List Items Addressed This Visit    Bilateral edema of lower extremity (Chronic)    Again, she never filled the furosemide dose. She thinks that Raquel Sarna is well-controlled. I did ask her to fill the furosemide dose and use it when necessary for worsening edema. Otherwise she would simply continue Maxide. Also told her to elevate her feet and consider support stockings.      Essential hypertension - Primary (Chronic)    Stable today. She has not started valsartan. For now we'll simply continue Maxide.      Relevant Medications   triamterene-hydrochlorothiazide (DYAZIDE) 37.5-25 MG capsule   furosemide (LASIX) 40 MG tablet   Metabolic syndrome (Chronic)    With combination of obesity, diabetes and hypertension she meets criteria for metabolic syndrome. This does put her at increased risk for cardiac disease. Recommend weight loss with diet and exercise. Close glycemic control -> she has noticed improvement from last year. Has lost little weight. Continue to monitor blood pressure as it is well stable right now. Also monitor lipids.       Other Visit Diagnoses    Need for immunization against influenza       Relevant Orders   Flu Vaccine QUAD 36+ mos IM (Completed)      Current medicines are reviewed at length with the patient today. (+/- concerns) n/a The following changes have been made: see below  Patient Instructions  MEDICATION MAY USE FUROSEMIDE 40 MG TABLET DAILY, IF NEEDED FOR SWELLING,    NO OTHER CHANGES , CURRENT MEDICATIONS      Your physician wants you to follow-up in 12 MONTHS  WITH DR Lacara Dunsworth. You will receive a reminder letter in the mail two months in advance. If you don't receive a letter, please call our office to schedule the follow-up appointment.    If you need a refill on your cardiac medications before your next appointment, please call your pharmacy.    Studies Ordered:   Orders Placed This Encounter  Procedures  . Flu Vaccine QUAD 36+ mos IM     Glenetta Hew, M.D., M.S. Interventional Cardiologist   Pager # [phone] Phone # [phone] [address] [place], [place] [address]

## 2017-08-05 NOTE — Patient Instructions (Signed)
MEDICATION MAY USE FUROSEMIDE 40 MG TABLET DAILY, IF NEEDED FOR SWELLING,    NO OTHER CHANGES , CURRENT MEDICATIONS      Your physician wants you to follow-up in 12 MONTHS WITH DR HARDING. You will receive a reminder letter in the mail two months in advance. If you don't receive a letter, please call our office to schedule the follow-up appointment.    If you need a refill on your cardiac medications before your next appointment, please call your pharmacy.

## 2017-08-07 ENCOUNTER — Encounter: Payer: Self-pay | Admitting: Cardiology

## 2017-08-07 NOTE — Assessment & Plan Note (Signed)
Stable today. She has not started valsartan. For now we'll simply continue Maxide.

## 2017-08-07 NOTE — Assessment & Plan Note (Signed)
With combination of obesity, diabetes and hypertension she meets criteria for metabolic syndrome. This does put her at increased risk for cardiac disease. Recommend weight loss with diet and exercise. Close glycemic control -> she has noticed improvement from last year. Has lost little weight. Continue to monitor blood pressure as it is well stable right now. Also monitor lipids.

## 2017-08-07 NOTE — Assessment & Plan Note (Signed)
Again, she never filled the furosemide dose. She thinks that Sandy Daniels is well-controlled. I did ask her to fill the furosemide dose and use it when necessary for worsening edema. Otherwise she would simply continue Maxide. Also told her to elevate her feet and consider support stockings.

## 2017-10-07 ENCOUNTER — Telehealth: Payer: Self-pay | Admitting: Gastroenterology

## 2017-10-07 NOTE — Telephone Encounter (Signed)
Patient called in tonight, on call line. She endorses some lower abdominal discomfort. She states she feels like she needs to have a bowel movement but can't. Passed hard stool in recent days. She is not sure if she is just constipated or having a flare of diverticulitis. She was treated empirically for diverticulitis earlier in the year. Prior CT in 2014 showed sigmoid diverticulitis. She denies any fevers. Discomfort rated 5/10. She doesn't think it is c/w prior diverticulitis symptoms when asked tonight, she thinks maybe more so constipation, but she's not sure.   She has Miralax and Linzess at home. Asked her to take a few doses of miralax or try Linzess, see if this will help produce a bowel movement and make her feel better. If she has worsening pain or fevers, can offer her empiric antibiotics for diverticulitis or she can go to the ED for imaging to assess for this. She agreed.  Sheri and Glendell Docker, [employer] can you call and see if she is any better in the AM. Thanks

## 2017-10-08 NOTE — Telephone Encounter (Signed)
Left message for patient to call back  

## 2017-10-13 NOTE — Telephone Encounter (Signed)
No return call from the patient 

## 2018-02-09 NOTE — Progress Notes (Signed)
Recent Labs: 12/2017 Na+ 141, K+ 4.1, Cl- 100, HCO3- 26 , BUN 9, Cr 0.95, Glu 129, Ca2+ 10; AST 12, ALT 14, ALP 71 TC 96, TG 105, HDL 46, LDL 29

## 2018-04-04 ENCOUNTER — Telehealth: Payer: Self-pay

## 2018-04-04 NOTE — Telephone Encounter (Signed)
Patient reports after a BM she saw "red stuff" in the commode after eating a large amount of water mellon.  She has not seen any additional  "red stuff" she does not report frank blood.  She will call back if she has any additional concerns.

## 2018-07-06 ENCOUNTER — Telehealth: Payer: Self-pay | Admitting: Cardiology

## 2018-07-06 NOTE — Telephone Encounter (Signed)
Received records from [facility] on 07/06/18, Appt 08/10/18 @ 1:40PM. NV

## 2018-07-11 ENCOUNTER — Telehealth: Payer: Self-pay | Admitting: Gastroenterology

## 2018-07-11 NOTE — Telephone Encounter (Signed)
Routed to [facility], this is a Dr. Carlean Purl patient. Thank you.

## 2018-07-11 NOTE — Telephone Encounter (Signed)
Patient notified that she can take the linzess daily and not as a PRN medications.  She is added if after 48 hours she can add Miralax. I did schedule her an office visit for 08/31/18.  She has not had an office visit since 2014

## 2018-08-10 ENCOUNTER — Ambulatory Visit: Payer: 59 | Admitting: Cardiology

## 2018-08-10 VITALS — BP 132/70 | HR 102 | Ht 66.0 in | Wt 304.0 lb

## 2018-08-10 DIAGNOSIS — E8881 Metabolic syndrome: Secondary | ICD-10-CM | POA: Diagnosis not present

## 2018-08-10 DIAGNOSIS — I10 Essential (primary) hypertension: Secondary | ICD-10-CM

## 2018-08-10 DIAGNOSIS — R600 Localized edema: Secondary | ICD-10-CM | POA: Diagnosis not present

## 2018-08-10 DIAGNOSIS — R000 Tachycardia, unspecified: Secondary | ICD-10-CM

## 2018-08-10 MED ORDER — METOPROLOL SUCCINATE ER 25 MG PO TB24: 25.0000 mg | ORAL_TABLET | Freq: Every day | ORAL | 3 refills | Status: DC

## 2018-08-10 NOTE — Patient Instructions (Addendum)
Medication Instructions:  Your Physician recommend you make the following changes to your medication. -Start: Toprol 25 mg every night  If you need a refill on your cardiac medications before your next appointment, please call your pharmacy.   Lab work: None    Testing/Procedures: None  Follow-Up: At [facility], you and your health needs are our priority.  As part of our continuing mission to provide you with exceptional heart care, we have created designated Provider Care Teams.  These Care Teams include your primary Cardiologist (physician) and Advanced Practice Providers (APPs -  Physician Assistants and Nurse Practitioners) who all work together to provide you with the care you need, when you need it. You will need a follow up appointment in 1 years.  Please call our office 2 months in advance to schedule this appointment.  You may see Dr. Ellyn Hack or one of the following Advanced Practice Providers on your designated Care Team:   Rosaria Ferries, PA-C . Jory Sims, DNP, ANP  Any Other Special Instructions Will Be Listed Below (If Applicable). Wear Support Hose (size medium)- take off at night

## 2018-08-10 NOTE — Progress Notes (Signed)
PCP: Deland Pretty, MD  Clinic Note: Chief Complaint  Patient presents with  . Annual Exam    Cardiac risk factors    HPI: Sandy Daniels is a 67 y.o. female with a PMH below who presents today for 4 month f/u follow-up for DOE & HTN management.   She was originally referred by  Dr. Shelia Media for evaluation given her cardiac risk factors of hypertension, obesity and diabetes. She has chronic lower extremity edema since her frequent ankle fractures years ago.  She was initially referred for an abnormal EKG but had a Myoview and echocardiogram performed several relatively normal.   Sandy Daniels was last seen On 04/30/2017 to " re-establish cardiology care" for HTN. Plan: Start valsartan and low-dose (40 mg) - hopefully will be tiitrated up for better support.  With her increased edema, Maxide does not seem to be quite effective. We will alternate Maxide and furosemid know what e every other day.   Starting furosemide 40 mg every other day  ELEVATE LEGS;' WEAR SUPPORT STOCKING DURING THE DAY AND REMOVE AT NIGHT  Recent Hospitalizations: None  Studies Personally Reviewed - (if available, images/films reviewed: From Epic Chart or Care Everywhere)  None  Interval History: Sandy Daniels returns today overall doing quite well from a cardiac standpoint.  She is more anxious than anything else.  This can be determined by her sinus tachycardia on EKG.  She is quite nervous here today for some reason but really is doing fine from a cardiac standpoint.  She also says it may be her heart is fast because her ankle hurts.  She recently twisted it.  That ankle swollen, but the rest of her feet are not swollen.  She never really did actually feel her furosemide and says the swelling is controlled with her Dyazide. Blood pressure has been pretty well controlled with Dyazide and she never really did get on the ARB that was planned to start either. No PND, orthopnea.  Mild intermittent aches and pains in her chest but no  anginal symptoms at rest or exertion.  Lots of stress going on anxiety. More because of obesity and deconditioning she has some exertional dyspnea and an dyspnea bending over, but no dyspnea with routine activity.  She is still doing Psychologist, occupational work with [employer] She is trying to exercise, but since she injured her leg has been having difficulty with that.  Has not been on lose weight.  No claudication.  No rapid irregular heartbeats palpitations.  No syncope/near syncope or TIA/amaurosis fugax .  ROS: A comprehensive was performed. Review of Systems  Constitutional: Negative for malaise/fatigue.  HENT: Negative for congestion and nosebleeds.   Eyes: Negative for blurred vision.  Respiratory: Positive for shortness of breath (Per history of present illness). Negative for cough and wheezing.   Cardiovascular: Positive for leg swelling.  Gastrointestinal: Negative for abdominal pain, constipation and heartburn.  Genitourinary: Positive for urgency. Negative for dysuria.  Musculoskeletal: Positive for back pain and joint pain (Left ankle). Negative for falls.  Neurological: Negative for dizziness.  Endo/Heme/Allergies: Negative for environmental allergies.  Psychiatric/Behavioral: Negative for depression and memory loss. The patient is nervous/anxious. The patient does not have insomnia.   All other systems reviewed and are negative.  I have reviewed and (if needed) personally updated the patient's problem list, medications, allergies, past medical and surgical history, social and family history.   Past Medical History:  Diagnosis Date  . Anxiety   . Diabetes mellitus without complication (  [place])   . Diverticulitis large intestine 01/2013  . GERD (gastroesophageal reflux disease)   . Goiter   . History of Helicobacter pylori infection 05/25/2003   gastritis at EGD  . Hypertension   . Hypothyroidism    On thyroid supplementation  . Morbid obesity with BMI of 45.0-49.9,  adult ([place])   . OA (osteoarthritis)   . Personal history of colonic adenomas 06/12/2013  . Seborrheic dermatitis   . Tendonitis of ankle   . Vitamin D deficiency     Past Surgical History:  Procedure Laterality Date  . COLONOSCOPY  03/15/2002   internal hemorrhoids, Dr. Jim Desanctis  . ESOPHAGOGASTRODUODENOSCOPY  05/25/2003   H. Pylori, Dr. Jim Desanctis  . NM MYOVIEW LTD  10/08/2011   LOW RISK study. EF 68%. Breast attenuation artifact noted otherwise normal study.  . TONSILLECTOMY    . TRANSTHORACIC ECHOCARDIOGRAM  10/08/2011   Normal study. No valve lesions. Normal LV function, EF >55%, GR 1 DD. No valve lesions  . UPPER GASTROINTESTINAL ENDOSCOPY    . VAGINAL HYSTERECTOMY      Current Meds  Medication Sig  . Dulaglutide (TRULICITY) 1.5 VZ/5.6LO SOPN Inject 1 Dose into the skin once a week.  Marland Kitchen ibuprofen (ADVIL,MOTRIN) 200 MG tablet Take 400 mg by mouth every 6 (six) hours as needed for moderate pain.  Marland Kitchen levothyroxine (SYNTHROID, LEVOTHROID) 100 MCG tablet Take 100 mcg by mouth daily before breakfast.  . LINZESS 290 MCG CAPS capsule TAKE 1 CAPSULE (290 MCG TOTAL) BY MOUTH DAILY.  . metFORMIN (GLUCOPHAGE) 500 MG tablet Take 500 mg by mouth 2 (two) times daily with a meal.  . ranitidine (ZANTAC) 150 MG tablet Take 150 mg by mouth 2 (two) times daily.  . rosuvastatin (CRESTOR) 10 MG tablet Take 10 mg by mouth daily.  Marland Kitchen triamterene-hydrochlorothiazide (DYAZIDE) 37.5-25 MG capsule Take 1 capsule by mouth every morning.   Current Facility-Administered Medications for the 08/10/18 encounter (Office Visit) with Sandy Man, MD  Medication  . 0.9 %  sodium chloride infusion    Allergies  Allergen Reactions  . Betadine [Povidone Iodine] Rash    Severe bumps and blistering  . Neosporin [Neomycin-Bacitracin Zn-Polymyx] Rash    blistering  . Nexium [Esomeprazole Magnesium] Other (See Comments)    Headache  . Penicillins Hives and Swelling    Social History   Tobacco Use  .  Smoking status: Former Smoker    Types: Cigarettes    Last attempt to quit: 01/03/2013    Years since quitting: 5.6  . Smokeless tobacco: Never Used  Substance Use Topics  . Alcohol use: No  . Drug use: No   Social History   Social History Narrative   [employer]   Divorced   1 daughter     family history includes Allergic rhinitis in her father; Dementia in her father; Diabetes in her father and mother; Heart disease in her father and mother; Hypertension in her father.  Wt Readings from Last 3 Encounters:  08/10/18 (!) 304 lb (137.9 kg)  08/05/17 300 lb (136.1 kg)  04/30/17 (!) 302 lb (137 kg)    PHYSICAL EXAM BP 132/70   Pulse (!) 102   Ht 5\' 6"  (1.676 m)   Wt (!) 304 lb (137.9 kg)   SpO2 98%   BMI 49.07 kg/m  Physical Exam  Constitutional: She is oriented to person, place, and time. She appears well-developed and well-nourished. No distress.  Morbidly obese  HENT:  Head:  Normocephalic and atraumatic.  Neck: No JVD present. Carotid bruit is not present.  Cardiovascular: Normal rate, regular rhythm, normal heart sounds and intact distal pulses.  No extrasystoles are present. PMI is not displaced. Exam reveals no gallop and no friction rub.  No murmur heard. Pulmonary/Chest: Breath sounds normal. No respiratory distress. She has no wheezes.  Abdominal: Soft. Bowel sounds are normal. She exhibits no distension. There is no tenderness. There is no rebound.  Musculoskeletal: Normal range of motion. She exhibits no edema (Trivial).  Neurological: She is alert and oriented to person, place, and time.  Skin: Skin is warm and dry.  Psychiatric: She has a normal mood and affect. Her behavior is normal. Judgment and thought content normal.  Nursing note and vitals reviewed.    Adult ECG Report  not checked.  Other studies Reviewed: Additional studies/ records that were reviewed today include:  Recent Labs: K PN March 2019: Total cholesterol  96, LDL 29, HDL 46, triglycerides 105.  A1c 7.3.  BUN/creatinine 9/0.95.  TSH 0.82  ASSESSMENT / PLAN: Problem List Items Addressed This Visit    Bilateral edema of lower extremity (Chronic)    Pretty well controlled with Maxide.  She never did get Lasix filled.  Talked about foot elevation and potentially support stockings if they get worse.    Continue with Maxide alone unless worsen, then would consider Lasix.      Essential hypertension (Chronic)    Blood pressure is actually pretty well controlled, but she is pretty tachycardic.  She never did start the losartan. With her being tachycardic, will start Toprol 25 mg nightly.      Relevant Medications   metoprolol succinate (TOPROL-XL) 25 MG 24 hr tablet   Other Relevant Orders   EKG 12-Lead (Completed)   Metabolic syndrome (Chronic)    With obesity, diabetes and hypertension GERD meets criteria for metabolic syndrome.  Thankfully, her lipids seem to be pretty well controlled.  I continue to recommend diet and exercise which I would think she would understand based on her volunteering with [employer].  We discussed dietary adjustment and ways to exercise including water aerobics/water walking.      Sinus tachycardia by electrocardiogram - Primary (Chronic)    Heart rate is pretty fast.  She is excited, but this is probably a little faster than it should be for baseline. Plan: Add Toprol 25 mg nightly.      Relevant Orders   EKG 12-Lead (Completed)      Current medicines are reviewed at length with the patient today. (+/- concerns) n/a The following changes have been made: see below  Patient Instructions  Medication Instructions:  Your Physician recommend you make the following changes to your medication. -Start: Toprol 25 mg every night  If you need a refill on your cardiac medications before your next appointment, please call your pharmacy.   Lab work: None    Testing/Procedures: None  Follow-Up: At [facility],  you and your health needs are our priority.  As part of our continuing mission to provide you with exceptional heart care, we have created designated Provider Care Teams.  These Care Teams include your primary Cardiologist (physician) and Advanced Practice Providers (APPs -  Physician Assistants and Nurse Practitioners) who all work together to provide you with the care you need, when you need it. You will need a follow up appointment in 1 years.  Please call our office 2 months in advance to schedule this appointment.  You may see  Dr. Ellyn Hack or one of the following Advanced Practice Providers on your designated Care Team:   Rosaria Ferries, PA-C . Jory Sims, DNP, ANP  Any Other Special Instructions Will Be Listed Below (If Applicable). Wear Support Hose (size medium)- take off at night     Studies Ordered:   Orders Placed This Encounter  Procedures  . EKG 12-Lead     Glenetta Hew, M.D., M.S. Interventional Cardiologist   Pager # [phone] Phone # [phone] [address] [place], [place] [address]

## 2018-08-12 ENCOUNTER — Encounter: Payer: Self-pay | Admitting: Cardiology

## 2018-08-12 NOTE — Assessment & Plan Note (Signed)
Pretty well controlled with Maxide.  She never did get Lasix filled.  Talked about foot elevation and potentially support stockings if they get worse.    Continue with Maxide alone unless worsen, then would consider Lasix.

## 2018-08-12 NOTE — Assessment & Plan Note (Signed)
Blood pressure is actually pretty well controlled, but she is pretty tachycardic.  She never did start the losartan. With her being tachycardic, will start Toprol 25 mg nightly.

## 2018-08-12 NOTE — Assessment & Plan Note (Signed)
Heart rate is pretty fast.  She is excited, but this is probably a little faster than it should be for baseline. Plan: Add Toprol 25 mg nightly.

## 2018-08-12 NOTE — Assessment & Plan Note (Signed)
With obesity, diabetes and hypertension GERD meets criteria for metabolic syndrome.  Thankfully, her lipids seem to be pretty well controlled.  I continue to recommend diet and exercise which I would think she would understand based on her volunteering with [employer].  We discussed dietary adjustment and ways to exercise including water aerobics/water walking.

## 2018-08-31 ENCOUNTER — Encounter: Payer: Self-pay | Admitting: Internal Medicine

## 2018-08-31 ENCOUNTER — Ambulatory Visit (INDEPENDENT_AMBULATORY_CARE_PROVIDER_SITE_OTHER): Payer: 59 | Admitting: Internal Medicine

## 2018-08-31 VITALS — BP 140/80 | HR 96 | Ht 66.0 in

## 2018-08-31 DIAGNOSIS — K5909 Other constipation: Secondary | ICD-10-CM

## 2018-08-31 NOTE — Progress Notes (Signed)
Sandy Daniels 67 y.o. 06/19/1951 [id number]  Assessment & Plan:   Encounter Diagnoses  Name Primary?  . Chronic constipation Yes  . Obesity, morbid, BMI 40.0-49.9 ([place])     The patient seems to be controlled by daily or almost daily Linzess.  Should be easier to use that when she is retired.   She is to look at the diet doctor website and explore low-carb and intermittent fasting regimens.  I have also given her a Mediterranean diet handout to look at.  I have explained that does not one size off her diet but she should pursue weight loss.  Did also discuss bariatric surgery though she is not interested in that at this point and certainly she should try changing her eating habits and lifestyle as she intends to.  SHe has not been to a dietitian.  Is not interested.  RTC prn  Stay on Linzess up to qd  Subjective:   Chief Complaint: Follow-up of constipation  HPI She has chronic constipation, she was prescribed Linzess in the past but was only using it sparingly.  She had some phone calls with this and use the Linzess daily and noted things were better but has trouble taking it every day because it makes it difficult to get to work etc. due to the bowel movements.  She is using it intermittently but several times a week and managing at this point.  She is about to retire and is excited about that.  Colonoscopy was performed earlier this year and results are below.  She notes intense burning from the propofol and either does not want propofol or wants assurances she would not have burning in her veins next time.   Two 2 to 3 mm polyps in the cecum, removed with a cold biopsy forceps. Resected and retrieved. - Post-polypectomy scar in the cecum. Biopsied. - Diverticulosis in the sigmoid colon. - The examination was otherwise normal on direct and retroflexion views. - Personal history of colonic polyps. Adenomas - had 25 mm cecal adenoma removed last year   Allergies  Allergen  Reactions  . Betadine [Povidone Iodine] Rash    Severe bumps and blistering  . Neosporin [Neomycin-Bacitracin Zn-Polymyx] Rash    blistering  . Nexium [Esomeprazole Magnesium] Other (See Comments)    Headache  . Penicillins Hives and Swelling   Current Meds  Medication Sig  . Dulaglutide (TRULICITY) 1.5 SW/5.4OE SOPN Inject 1 Dose into the skin once a week.  Marland Kitchen ibuprofen (ADVIL,MOTRIN) 200 MG tablet Take 400 mg by mouth every 6 (six) hours as needed for moderate pain.  Marland Kitchen levothyroxine (SYNTHROID, LEVOTHROID) 100 MCG tablet Take 100 mcg by mouth daily before breakfast.  . LINZESS 290 MCG CAPS capsule TAKE 1 CAPSULE (290 MCG TOTAL) BY MOUTH DAILY.  . metFORMIN (GLUCOPHAGE) 500 MG tablet Take 500 mg by mouth 2 (two) times daily with a meal.  . metoprolol succinate (TOPROL-XL) 25 MG 24 hr tablet Take 1 tablet (25 mg total) by mouth at bedtime. Take with or immediately following a meal.  . ranitidine (ZANTAC) 150 MG tablet Take 150 mg by mouth 2 (two) times daily.  . rosuvastatin (CRESTOR) 10 MG tablet Take 10 mg by mouth daily.  Marland Kitchen triamterene-hydrochlorothiazide (DYAZIDE) 37.5-25 MG capsule Take 1 capsule by mouth every morning.   Current Facility-Administered Medications for the 08/31/18 encounter (Office Visit) with Gatha Mayer, MD  Medication  . 0.9 %  sodium chloride infusion   Past Medical History:  Diagnosis Date  .  Anxiety   . Diabetes mellitus without complication ([place])   . Diverticulitis large intestine 01/2013  . GERD (gastroesophageal reflux disease)   . Goiter   . History of Helicobacter pylori infection 05/25/2003   gastritis at EGD  . Hypertension   . Hypothyroidism    On thyroid supplementation  . Morbid obesity with BMI of 45.0-49.9, adult ([place])   . OA (osteoarthritis)   . Personal history of colonic adenomas 06/12/2013  . Seborrheic dermatitis   . Tendonitis of ankle   . Vitamin D deficiency    Past Surgical History:  Procedure Laterality Date  .  COLONOSCOPY  03/15/2002   internal hemorrhoids, Dr. Jim Desanctis  . ESOPHAGOGASTRODUODENOSCOPY  05/25/2003   H. Pylori, Dr. Jim Desanctis  . NM MYOVIEW LTD  10/08/2011   LOW RISK study. EF 68%. Breast attenuation artifact noted otherwise normal study.  . TONSILLECTOMY    . TRANSTHORACIC ECHOCARDIOGRAM  10/08/2011   Normal study. No valve lesions. Normal LV function, EF >55%, GR 1 DD. No valve lesions  . UPPER GASTROINTESTINAL ENDOSCOPY    . VAGINAL HYSTERECTOMY     Social History   Social History Narrative   [employer] - accounting department   Divorced   1 daughter   family history includes Allergic rhinitis in her father; Dementia in her father; Diabetes in her father and mother; Heart disease in her father and mother; Hypertension in her father.   Review of Systems As per HPI  Objective:   Physical Exam BP 140/80   Pulse 96   Ht 5\' 6"  (1.676 m)   BMI 49.07 kg/m  No acute distress  15 minutes time spent with patient > half in counseling coordination of care

## 2018-08-31 NOTE — Patient Instructions (Addendum)
  We are giving you information to read on Mediterranean diet.   Please Google the Diet Dr website for information on low carb eating and intermit fasting.   Talk with your endocrinology Doctor about seeing a dietician.   Good luck with your retirement!!    I appreciate the opportunity to care for you. Silvano Rusk, MD, [facility]

## 2018-09-03 ENCOUNTER — Other Ambulatory Visit: Payer: Self-pay | Admitting: Internal Medicine

## 2018-09-07 DIAGNOSIS — K219 Gastro-esophageal reflux disease without esophagitis: Secondary | ICD-10-CM | POA: Diagnosis not present

## 2018-09-07 DIAGNOSIS — E119 Type 2 diabetes mellitus without complications: Secondary | ICD-10-CM | POA: Diagnosis not present

## 2018-09-07 DIAGNOSIS — L820 Inflamed seborrheic keratosis: Secondary | ICD-10-CM | POA: Diagnosis not present

## 2018-09-07 DIAGNOSIS — I10 Essential (primary) hypertension: Secondary | ICD-10-CM | POA: Diagnosis not present

## 2018-10-25 ENCOUNTER — Telehealth: Payer: Self-pay | Admitting: Internal Medicine

## 2018-10-25 NOTE — Telephone Encounter (Signed)
Patient had diarrhea yesterday.  She is feeling much better today.  Diarrhea has stopped.  She reports that at times her stomach is upset.  She is advised to try Pepto-bismol.  She will call back for any additional questions or concerns.

## 2018-10-25 NOTE — Telephone Encounter (Signed)
Pt said that she had very bad diarrhea yesterday, it seems that it has stopped today but her stomach feels like it is burning inside. She wants to know if Dr. Carlean Purl can prescribe something or what she can do. Pls call her.

## 2018-12-20 DIAGNOSIS — J45909 Unspecified asthma, uncomplicated: Secondary | ICD-10-CM | POA: Diagnosis not present

## 2018-12-20 DIAGNOSIS — R05 Cough: Secondary | ICD-10-CM | POA: Diagnosis not present

## 2018-12-20 DIAGNOSIS — J309 Allergic rhinitis, unspecified: Secondary | ICD-10-CM | POA: Diagnosis not present

## 2019-01-11 DIAGNOSIS — Z7984 Long term (current) use of oral hypoglycemic drugs: Secondary | ICD-10-CM | POA: Diagnosis not present

## 2019-01-11 DIAGNOSIS — H25093 Other age-related incipient cataract, bilateral: Secondary | ICD-10-CM | POA: Diagnosis not present

## 2019-01-11 DIAGNOSIS — H524 Presbyopia: Secondary | ICD-10-CM | POA: Diagnosis not present

## 2019-01-11 DIAGNOSIS — H52223 Regular astigmatism, bilateral: Secondary | ICD-10-CM | POA: Diagnosis not present

## 2019-01-11 DIAGNOSIS — H1045 Other chronic allergic conjunctivitis: Secondary | ICD-10-CM | POA: Diagnosis not present

## 2019-01-11 DIAGNOSIS — E119 Type 2 diabetes mellitus without complications: Secondary | ICD-10-CM | POA: Diagnosis not present

## 2019-01-11 DIAGNOSIS — H5213 Myopia, bilateral: Secondary | ICD-10-CM | POA: Diagnosis not present

## 2019-04-30 ENCOUNTER — Emergency Department (HOSPITAL_COMMUNITY): Payer: Medicare Other

## 2019-04-30 ENCOUNTER — Other Ambulatory Visit: Payer: Self-pay

## 2019-04-30 ENCOUNTER — Emergency Department (HOSPITAL_COMMUNITY)
Admission: EM | Admit: 2019-04-30 | Discharge: 2019-04-30 | Disposition: A | Discharge status: Home / Self Care | Payer: Medicare Other | Attending: Emergency Medicine | Admitting: Emergency Medicine

## 2019-04-30 ENCOUNTER — Encounter (HOSPITAL_COMMUNITY): Payer: Self-pay

## 2019-04-30 ENCOUNTER — Telehealth: Payer: Self-pay | Admitting: Nurse Practitioner

## 2019-04-30 DIAGNOSIS — K760 Fatty (change of) liver, not elsewhere classified: Secondary | ICD-10-CM | POA: Diagnosis not present

## 2019-04-30 DIAGNOSIS — R1010 Upper abdominal pain, unspecified: Secondary | ICD-10-CM | POA: Diagnosis present

## 2019-04-30 DIAGNOSIS — R1013 Epigastric pain: Secondary | ICD-10-CM | POA: Diagnosis not present

## 2019-04-30 DIAGNOSIS — N390 Urinary tract infection, site not specified: Secondary | ICD-10-CM

## 2019-04-30 DIAGNOSIS — Z87891 Personal history of nicotine dependence: Secondary | ICD-10-CM | POA: Diagnosis not present

## 2019-04-30 DIAGNOSIS — I700 Atherosclerosis of aorta: Secondary | ICD-10-CM | POA: Diagnosis not present

## 2019-04-30 DIAGNOSIS — K8020 Calculus of gallbladder without cholecystitis without obstruction: Secondary | ICD-10-CM | POA: Diagnosis not present

## 2019-04-30 DIAGNOSIS — Z79899 Other long term (current) drug therapy: Secondary | ICD-10-CM | POA: Diagnosis not present

## 2019-04-30 DIAGNOSIS — K5730 Diverticulosis of large intestine without perforation or abscess without bleeding: Secondary | ICD-10-CM | POA: Diagnosis not present

## 2019-04-30 DIAGNOSIS — R0602 Shortness of breath: Secondary | ICD-10-CM | POA: Diagnosis not present

## 2019-04-30 LAB — COMPREHENSIVE METABOLIC PANEL
ALT: 38 U/L (ref 0–44)
AST: 43 U/L — ABNORMAL HIGH (ref 15–41)
Albumin: 3.8 g/dL (ref 3.5–5.0)
Alkaline Phosphatase: 88 U/L (ref 38–126)
Anion gap: 13 (ref 5–15)
BUN: 8 mg/dL (ref 8–23)
CO2: 21 mmol/L — ABNORMAL LOW (ref 22–32)
Calcium: 9.3 mg/dL (ref 8.9–10.3)
Chloride: 96 mmol/L — ABNORMAL LOW (ref 98–111)
Creatinine, Ser: 0.94 mg/dL (ref 0.44–1.00)
GFR calc Af Amer: >60 mL/min (ref >60)
GFR calc non Af Amer: >60 mL/min (ref >60)
Glucose, Bld: 213 mg/dL — ABNORMAL HIGH (ref 70–99)
Potassium: 3.5 mmol/L (ref 3.5–5.1)
Sodium: 130 mmol/L — ABNORMAL LOW (ref 135–145)
Total Bilirubin: 0.9 mg/dL (ref 0.3–1.2)
Total Protein: 8.1 g/dL (ref 6.5–8.1)

## 2019-04-30 LAB — CBC
HCT: 46 % (ref 36.0–46.0)
Hemoglobin: 14.8 g/dL (ref 12.0–15.0)
MCH: 28 pg (ref 26.0–34.0)
MCHC: 32.2 g/dL (ref 30.0–36.0)
MCV: 87 fL (ref 80.0–100.0)
Platelets: 183 10*3/uL (ref 150–400)
RBC: 5.29 MIL/uL — ABNORMAL HIGH (ref 3.87–5.11)
RDW: 14.1 % (ref 11.5–15.5)
WBC: 16 10*3/uL — ABNORMAL HIGH (ref 4.0–10.5)
nRBC: 0 % (ref 0.0–0.2)

## 2019-04-30 LAB — LACTIC ACID, PLASMA: Lactic Acid, Venous: 1.7 mmol/L (ref 0.5–1.9)

## 2019-04-30 LAB — URINALYSIS, ROUTINE W REFLEX MICROSCOPIC
Bacteria, UA: NONE SEEN
Bilirubin Urine: NEGATIVE
Glucose, UA: 50 mg/dL — AB
Ketones, ur: 5 mg/dL — AB
Nitrite: NEGATIVE
Protein, ur: NEGATIVE mg/dL
Specific Gravity, Urine: 1.032 — ABNORMAL HIGH (ref 1.005–1.030)
WBC, UA: >50 WBC/hpf — ABNORMAL HIGH (ref 0–5)
pH: 6 (ref 5.0–8.0)

## 2019-04-30 LAB — LIPASE, BLOOD: Lipase: 47 U/L (ref 11–51)

## 2019-04-30 MED ORDER — ACETAMINOPHEN 325 MG PO TABS
650.0000 mg | ORAL_TABLET | Freq: Once | ORAL | Status: AC
  Administered 2019-04-30: 650 mg via ORAL
  Filled 2019-04-30: qty 2

## 2019-04-30 MED ORDER — SODIUM CHLORIDE (PF) 0.9 % IJ SOLN
INTRAMUSCULAR | Status: AC
  Filled 2019-04-30: qty 50

## 2019-04-30 MED ORDER — CIPROFLOXACIN IN D5W 400 MG/200ML IV SOLN
400.0000 mg | Freq: Once | INTRAVENOUS | Status: AC
  Administered 2019-04-30: 400 mg via INTRAVENOUS
  Filled 2019-04-30: qty 200

## 2019-04-30 MED ORDER — IOHEXOL 300 MG/ML  SOLN
100.0000 mL | Freq: Once | INTRAMUSCULAR | Status: AC | PRN
  Administered 2019-04-30: 100 mL via INTRAVENOUS

## 2019-04-30 MED ORDER — HYDROMORPHONE HCL 1 MG/ML IJ SOLN
1.0000 mg | Freq: Once | INTRAMUSCULAR | Status: AC
  Administered 2019-04-30: 1 mg via INTRAVENOUS
  Filled 2019-04-30: qty 1

## 2019-04-30 MED ORDER — HYDROCODONE-ACETAMINOPHEN 5-325 MG PO TABS: 1.0000 | ORAL_TABLET | ORAL | 0 refills | Status: DC | PRN

## 2019-04-30 MED ORDER — SODIUM CHLORIDE 0.9% FLUSH
3.0000 mL | Freq: Once | INTRAVENOUS | Status: AC
  Administered 2019-04-30: 3 mL via INTRAVENOUS

## 2019-04-30 MED ORDER — CIPROFLOXACIN HCL 500 MG PO TABS: 500.0000 mg | ORAL_TABLET | Freq: Two times a day (BID) | ORAL | 0 refills | Status: DC

## 2019-04-30 NOTE — ED Notes (Signed)
Patient unable to provide urine sample at this time

## 2019-04-30 NOTE — ED Provider Notes (Signed)
[facility] DEPT Provider Note   CSN: [id number] Arrival date & time: 04/30/19  1605     History   Chief Complaint Chief Complaint  Patient presents with   Fever   Abdominal Pain    HPI Sandy Daniels is a 68 y.o. female.     HPI   68 year old female with upper abdominal pain.  Onset earlier today.  Persistent throughout.  Pain is in the mid to upper abdomen.  Does not lateralize.  Constant since onset.  Some mild shortness of breath.  No cough.  No nausea or vomiting.  No diarrhea.  Urine has been darker in color and has a stronger odor but no dysuria.  Past Medical History:  Diagnosis Date   Anxiety    Diabetes mellitus without complication ([place])    Diverticulitis large intestine 01/2013   GERD (gastroesophageal reflux disease)    Goiter    History of Helicobacter pylori infection 05/25/2003   gastritis at EGD   Hypertension    Hypothyroidism    On thyroid supplementation   Morbid obesity with BMI of 45.0-49.9, adult (HCC)    OA (osteoarthritis)    Personal history of colonic adenomas 06/12/2013   Seborrheic dermatitis    Tendonitis of ankle    Vitamin D deficiency     Patient Active Problem List   Diagnosis Date Noted   Sinus tachycardia by electrocardiogram 08/10/2018   Bilateral edema of lower extremity 05/02/2017   Chronic rhinitis 10/29/2015   Dermatitis 14/78/2956   Metabolic syndrome 21/30/8657   Abnormal finding on EKG 06/08/2015   Diabetes mellitus type 2, uncontrolled ([place])    Cellulitis 04/07/2015   UTI (lower urinary tract infection) 04/07/2015   Unspecified constipation 09/20/2013   History of colonic polyps 06/12/2013   Essential hypertension 02/25/2013   Hypothyroidism 02/25/2013    Past Surgical History:  Procedure Laterality Date   COLONOSCOPY  03/15/2002   internal hemorrhoids, Dr. Jim Desanctis   ESOPHAGOGASTRODUODENOSCOPY  05/25/2003   H. Pylori, Dr. Jim Desanctis   NM MYOVIEW  LTD  10/08/2011   LOW RISK study. EF 68%. Breast attenuation artifact noted otherwise normal study.   TONSILLECTOMY     TRANSTHORACIC ECHOCARDIOGRAM  10/08/2011   Normal study. No valve lesions. Normal LV function, EF >55%, GR 1 DD. No valve lesions   UPPER GASTROINTESTINAL ENDOSCOPY     VAGINAL HYSTERECTOMY       OB History   No obstetric history on file.      Home Medications    Prior to Admission medications   Medication Sig Start Date End Date Taking? Authorizing Provider  calcium carbonate (TUMS - DOSED IN MG ELEMENTAL CALCIUM) 500 MG chewable tablet Chew 1 tablet by mouth daily.   Yes [provider]  Dulaglutide (TRULICITY) 1.5 QI/6.9GE SOPN Inject 1 Dose into the skin once a week.   Yes [provider]  levothyroxine (SYNTHROID, LEVOTHROID) 100 MCG tablet Take 100 mcg by mouth daily before breakfast.   Yes [provider]  LINZESS 290 MCG CAPS capsule TAKE 1 CAPSULE (290 MCG TOTAL) BY MOUTH DAILY. 09/05/18  Yes Gatha Mayer, MD  metFORMIN (GLUCOPHAGE-XR) 500 MG 24 hr tablet Take 500 mg by mouth 2 (two) times daily. 01/18/19  Yes [provider]  metoprolol succinate (TOPROL-XL) 25 MG 24 hr tablet Take 1 tablet (25 mg total) by mouth at bedtime. Take with or immediately following a meal. 08/10/18 04/30/19 Yes Leonie Man, MD  rosuvastatin ([place]) 10  MG tablet Take 10 mg by mouth daily. 01/14/17  Yes [provider]  triamterene-hydrochlorothiazide (DYAZIDE) 37.5-25 MG capsule Take 1 capsule by mouth every morning. 06/30/17  Yes [provider]  ciprofloxacin (CIPRO) 500 MG tablet Take 1 tablet (500 mg total) by mouth every 12 (twelve) hours. 04/30/19   Virgel Manifold, MD  HYDROcodone-acetaminophen (NORCO/VICODIN) 5-325 MG tablet Take 1 tablet by mouth every 4 (four) hours as needed. 04/30/19   Virgel Manifold, MD    Family History Family History  Problem Relation Age of Onset   Dementia Father    Diabetes Father      Heart disease Father    Hypertension Father    Allergic rhinitis Father    Heart disease Mother    Diabetes Mother    Colon cancer Neg Hx    Rectal cancer Neg Hx    Stomach cancer Neg Hx    Esophageal cancer Neg Hx    Pancreatic cancer Neg Hx     Social History Social History   Tobacco Use   Smoking status: Former Smoker    Types: Cigarettes    Quit date: 01/03/2013    Years since quitting: 6.3   Smokeless tobacco: Never Used  Substance Use Topics   Alcohol use: No   Drug use: No     Allergies   Betadine [povidone iodine], Neosporin [neomycin-bacitracin zn-polymyx], Nexium [esomeprazole magnesium], and Penicillins   Review of Systems Review of Systems  All systems reviewed and negative, other than as noted in HPI.  Physical Exam Updated Vital Signs BP 135/72    Pulse (!) 105    Temp 98.6 F (37 C)    Resp 18    SpO2 95%   Physical Exam Vitals signs and nursing note reviewed.  Constitutional:      General: She is not in acute distress.    Appearance: She is well-developed.  HENT:     Head: Normocephalic and atraumatic.  Eyes:     General:        Right eye: No discharge.        Left eye: No discharge.     Conjunctiva/sclera: Conjunctivae normal.  Neck:     Musculoskeletal: Neck supple.  Cardiovascular:     Rate and Rhythm: Normal rate and regular rhythm.     Heart sounds: Normal heart sounds. No murmur. No friction rub. No gallop.   Pulmonary:     Effort: Pulmonary effort is normal. No respiratory distress.     Breath sounds: Normal breath sounds.  Abdominal:     General: There is no distension.     Palpations: Abdomen is soft.     Tenderness: There is abdominal tenderness in the epigastric area and periumbilical area.     Comments: Mild periumbilical and epigastric tenderness without rebound or guarding.  No distention.  Musculoskeletal:        General: No tenderness.  Skin:    General: Skin is warm and dry.  Neurological:      Mental Status: She is alert.  Psychiatric:        Behavior: Behavior normal.        Thought Content: Thought content normal.      ED Treatments / Results  Labs (all labs ordered are listed, but only abnormal results are displayed) Labs Reviewed  COMPREHENSIVE METABOLIC PANEL - Abnormal; Notable for the following components:      Result Value   Sodium 130 (*)    Chloride 96 (*)  CO2 21 (*)    Glucose, Bld 213 (*)    AST 43 (*)    All other components within normal limits  CBC - Abnormal; Notable for the following components:   WBC 16.0 (*)    RBC 5.29 (*)    All other components within normal limits  URINALYSIS, ROUTINE W REFLEX MICROSCOPIC - Abnormal; Notable for the following components:   Specific Gravity, Urine 1.032 (*)    Glucose, UA 50 (*)    Hgb urine dipstick MODERATE (*)    Ketones, ur 5 (*)    Leukocytes,Ua MODERATE (*)    WBC, UA >50 (*)    All other components within normal limits  CULTURE, BLOOD (ROUTINE X 2)  CULTURE, BLOOD (ROUTINE X 2)  URINE CULTURE  LIPASE, BLOOD  LACTIC ACID, PLASMA  LACTIC ACID, PLASMA    EKG    Radiology Ct Abdomen Pelvis W Contrast  Result Date: 04/30/2019 CLINICAL DATA:  68 y/o F; upper abdominal pain, shortness of breath, fever. EXAM: CT ABDOMEN AND PELVIS WITH CONTRAST TECHNIQUE: Multidetector CT imaging of the abdomen and pelvis was performed using the standard protocol following bolus administration of intravenous contrast. CONTRAST:  152mL OMNIPAQUE IOHEXOL 300 MG/ML  SOLN COMPARISON:  02/25/2013 CT abdomen and pelvis. FINDINGS: Lower chest: No acute abnormality. Hepatobiliary: Hepatic steatosis. No focal liver abnormality is seen. Cholelithiasis. No pericholecystic fluid, gallbladder wall thickening, or biliary dilatation. Pancreas: Unremarkable. No pancreatic ductal dilatation or surrounding inflammatory changes. Spleen: Normal in size without focal abnormality. Adrenals/Urinary Tract: Adrenal glands are unremarkable.  Small cyst in lower pole of right kidney. Otherwise kidneys are normal, without renal calculi, focal lesion, or hydronephrosis. Bladder is unremarkable. Stomach/Bowel: Stomach is within normal limits. Appendix not identified, no pericecal inflammation. No evidence of bowel wall thickening, distention, or inflammatory changes. Mild sigmoid diverticulosis. Vascular/Lymphatic: Aortic atherosclerosis. No enlarged abdominal or pelvic lymph nodes. Reproductive: Status post hysterectomy. No adnexal masses. Other: No abdominal wall hernia or abnormality. No abdominopelvic ascites. Musculoskeletal: No acute fracture. Lumbar spondylosis with prominent facet arthropathy. IMPRESSION: 1. No acute process identified. 2. Hepatic steatosis. 3. Cholelithiasis. 4. Mild sigmoid diverticulosis. 5. Aortic Atherosclerosis (ICD10-I70.0). Electronically Signed   By: Kristine Garbe M.D.   On: 04/30/2019 19:55   US Abdomen Limited  Result Date: 04/30/2019 CLINICAL DATA:  Pain EXAM: ULTRASOUND ABDOMEN LIMITED RIGHT UPPER QUADRANT COMPARISON:  CT from the same day FINDINGS: Gallbladder: There is cholelithiasis without secondary signs of acute cholecystitis. There is no gallbladder wall thickening or pericholecystic free fluid. The sonographic Percell Miller sign is reported as negative. Common bile duct: Diameter: 0.5 cm Liver: Diffuse increased echogenicity with slightly heterogeneous liver. Appearance typically secondary to fatty infiltration. Fibrosis secondary consideration. No secondary findings of cirrhosis noted. No focal hepatic lesion or intrahepatic biliary duct dilatation. Portal vein is patent on color Doppler imaging with normal direction of blood flow towards the liver. IMPRESSION: 1. There is cholelithiasis without secondary signs of acute cholecystitis. 2. Diffuse increased echogenicity with slightly heterogeneous liver. Appearance typically secondary to fatty infiltration. Fibrosis secondary consideration. No secondary  findings of cirrhosis noted. No focal hepatic lesion or intrahepatic biliary duct dilatation. Electronically Signed   By: Constance Holster M.D.   On: 04/30/2019 20:47    Procedures Procedures (including critical care time)  Medications Ordered in ED Medications  sodium chloride (PF) 0.9 % injection (has no administration in time range)  sodium chloride flush (NS) 0.9 % injection 3 mL (3 mLs Intravenous Given 04/30/19 1648)  acetaminophen (TYLENOL)  tablet 650 mg (650 mg Oral Given 04/30/19 1631)  HYDROmorphone (DILAUDID) injection 1 mg (1 mg Intravenous Given 04/30/19 1734)  iohexol (OMNIPAQUE) 300 MG/ML solution 100 mL (100 mLs Intravenous Contrast Given 04/30/19 1928)  ciprofloxacin (CIPRO) IVPB 400 mg (0 mg Intravenous Stopped 04/30/19 2209)     Initial Impression / Assessment and Plan / ED Course  I have reviewed the triage vital signs and the nursing notes.  Pertinent labs & imaging results that were available during my care of the patient were reviewed by me and considered in my medical decision making (see chart for details).       68 year old female abdominal pain and fever.  Gallstones noted on CT but no ultrasound evidence of cholecystitis.  LFTs look okay.  Lipase is normal.  Does appear that she has a urinary tract infection although her pain does seem somewhat high for this.  She is having some urinary symptoms though.  We will treat.  Return precautions discussed.  I feel she is appropriate outpatient treatment at this time.  Final Clinical Impressions(s) / ED Diagnoses   Final diagnoses:  Epigastric pain  Lower urinary tract infectious disease    ED Discharge Orders         Ordered    ciprofloxacin (CIPRO) 500 MG tablet  Every 12 hours     04/30/19 2203    HYDROcodone-acetaminophen (NORCO/VICODIN) 5-325 MG tablet  Every 4 hours PRN     04/30/19 2203           Virgel Manifold, MD 04/30/19 2243

## 2019-04-30 NOTE — ED Triage Notes (Signed)
She c/o upper abd. Pain since yesterday; plus shortness of breath and fever.

## 2019-04-30 NOTE — ED Notes (Signed)
Bed: WA12 Expected date:  Expected time:  Means of arrival:  Comments: Negative Pressure 

## 2019-04-30 NOTE — ED Notes (Signed)
Ultrasound at bedside

## 2019-04-30 NOTE — ED Notes (Signed)
Pt transported to CT ?

## 2019-04-30 NOTE — ED Notes (Signed)
Assisted pt to the bathroom using a wheelchair. Pt was able to provide a urine sample.

## 2019-04-30 NOTE — Telephone Encounter (Signed)
Patient complains of severe central upper abd pain, radiates across right and left rib cages, never had pain like this before. No N/V. Tolerating fluids, urinating adequately clear yellow urine, no fever sweats and chills. Pain started yesterday after eating a chicken sandwich, up all night in pain. Some heartburn, takes Gaviscon PRN. Hx diverticulitis, past H. Pylori. Patient of Dr. Celesta Aver. She is moaning out loud due to pain. I advised patient to present to [facility] ED which is closest to her home. Await EGD evaluation, labs/sono. I will message  Dr. Carlean Purl to follow up on patient's status tomorrow.

## 2019-05-02 LAB — URINE CULTURE: Culture: >=100000 — AB

## 2019-05-03 ENCOUNTER — Telehealth: Payer: Self-pay | Admitting: *Deleted

## 2019-05-03 NOTE — Telephone Encounter (Signed)
Post ED Visit - Positive Culture Follow-up  Culture report reviewed by antimicrobial stewardship pharmacist: [facility] Team []  Elenor Quinones, Pharm.D. []  Heide Guile, Pharm.D., BCPS AQ-ID []  Parks Neptune, Pharm.D., BCPS []  Alycia Rossetti, Pharm.D., BCPS []  [place], [place].D., BCPS, AAHIVP []  Legrand Como, Pharm.D., BCPS, AAHIVP []  Salome Arnt, PharmD, BCPS []  Johnnette Gourd, PharmD, BCPS []  Hughes Better, PharmD, BCPS []  Leeroy Cha, PharmD []  Laqueta Linden, PharmD, BCPS []  Albertina Parr, PharmD  [place] Team []  Leodis Sias, PharmD []  Lindell Spar, PharmD []  Royetta Asal, PharmD []  Graylin Shiver, Rph []  Rema Fendt) Glennon Mac, PharmD []  Arlyn Dunning, PharmD []  Netta Cedars, PharmD []  Dia Sitter, PharmD []  Leone Haven, PharmD []  Gretta Arab, PharmD [x]  Theodis Shove, PharmD []  Peggyann Juba, PharmD []  Reuel Boom, PharmD   Positive urine culture Treated with Ciprofloxacin HCL, organism sensitive to the same and no further patient follow-up is required at this time.  Harlon Flor [facility] 05/03/2019, 9:10 AM

## 2019-05-05 LAB — CULTURE, BLOOD (ROUTINE X 2)
Culture: NO GROWTH
Special Requests: ADEQUATE

## 2019-05-19 DIAGNOSIS — I700 Atherosclerosis of aorta: Secondary | ICD-10-CM | POA: Diagnosis not present

## 2019-05-19 DIAGNOSIS — K219 Gastro-esophageal reflux disease without esophagitis: Secondary | ICD-10-CM | POA: Diagnosis not present

## 2019-05-23 ENCOUNTER — Telehealth: Payer: Self-pay | Admitting: Cardiology

## 2019-05-23 NOTE — Telephone Encounter (Signed)
Spoke with pt who report she had a CT scan of her abdomen and was told that they seen plaque in her aorta. Pt state she is really worried and wanted to know what Dr. Ellyn Hack recommended. Informed pt that Nurse will have MD review CT scan. Pt voiced understanding.

## 2019-05-23 NOTE — Telephone Encounter (Signed)
Aortic Atherosclerosis Plaque is pretty much expected in patients. > 65.   Main recommendations are continue to control BP, Cholesterol & Glucose levels -- all RFs for atherosclerosis.   Glenetta Hew, MD

## 2019-05-23 NOTE — Telephone Encounter (Signed)
Her primary care doctor-Dr. Deland Pretty has been following her lipids.  By the last time I saw them they were very well controlled.  I think we can just continue doing what were doing.   Glenetta Hew, MD

## 2019-05-23 NOTE — Telephone Encounter (Signed)
New Message     Patient has a question about a test to ask.

## 2019-05-23 NOTE — Telephone Encounter (Signed)
Pt updated and voiced understanding. Pt also questioning if MD feels she need to have cholesterol levels checked.   Will route to MD

## 2019-05-23 NOTE — Telephone Encounter (Signed)
Pt updated and voiced understanding.  

## 2019-06-05 DIAGNOSIS — E119 Type 2 diabetes mellitus without complications: Secondary | ICD-10-CM | POA: Diagnosis not present

## 2019-07-11 DIAGNOSIS — E7800 Pure hypercholesterolemia, unspecified: Secondary | ICD-10-CM | POA: Diagnosis not present

## 2019-07-11 DIAGNOSIS — Z1159 Encounter for screening for other viral diseases: Secondary | ICD-10-CM | POA: Diagnosis not present

## 2019-07-11 DIAGNOSIS — E039 Hypothyroidism, unspecified: Secondary | ICD-10-CM | POA: Diagnosis not present

## 2019-07-11 DIAGNOSIS — I10 Essential (primary) hypertension: Secondary | ICD-10-CM | POA: Diagnosis not present

## 2019-07-11 DIAGNOSIS — E119 Type 2 diabetes mellitus without complications: Secondary | ICD-10-CM | POA: Diagnosis not present

## 2019-07-14 DIAGNOSIS — J309 Allergic rhinitis, unspecified: Secondary | ICD-10-CM | POA: Diagnosis not present

## 2019-07-14 DIAGNOSIS — Z86010 Personal history of colonic polyps: Secondary | ICD-10-CM | POA: Diagnosis not present

## 2019-07-14 DIAGNOSIS — E7800 Pure hypercholesterolemia, unspecified: Secondary | ICD-10-CM | POA: Diagnosis not present

## 2019-07-14 DIAGNOSIS — E118 Type 2 diabetes mellitus with unspecified complications: Secondary | ICD-10-CM | POA: Diagnosis not present

## 2019-07-14 DIAGNOSIS — Z0000 Encounter for general adult medical examination without abnormal findings: Secondary | ICD-10-CM | POA: Diagnosis not present

## 2019-07-14 DIAGNOSIS — E039 Hypothyroidism, unspecified: Secondary | ICD-10-CM | POA: Diagnosis not present

## 2019-07-14 DIAGNOSIS — I10 Essential (primary) hypertension: Secondary | ICD-10-CM | POA: Diagnosis not present

## 2019-07-14 DIAGNOSIS — E1165 Type 2 diabetes mellitus with hyperglycemia: Secondary | ICD-10-CM | POA: Diagnosis not present

## 2019-07-14 DIAGNOSIS — R9431 Abnormal electrocardiogram [ECG] [EKG]: Secondary | ICD-10-CM | POA: Diagnosis not present

## 2019-07-14 DIAGNOSIS — K219 Gastro-esophageal reflux disease without esophagitis: Secondary | ICD-10-CM | POA: Diagnosis not present

## 2019-07-14 DIAGNOSIS — R609 Edema, unspecified: Secondary | ICD-10-CM | POA: Diagnosis not present

## 2019-07-14 DIAGNOSIS — J45909 Unspecified asthma, uncomplicated: Secondary | ICD-10-CM | POA: Diagnosis not present

## 2019-07-20 DIAGNOSIS — Z1231 Encounter for screening mammogram for malignant neoplasm of breast: Secondary | ICD-10-CM | POA: Diagnosis not present

## 2019-07-20 DIAGNOSIS — N952 Postmenopausal atrophic vaginitis: Secondary | ICD-10-CM | POA: Diagnosis not present

## 2019-07-20 DIAGNOSIS — Z01419 Encounter for gynecological examination (general) (routine) without abnormal findings: Secondary | ICD-10-CM | POA: Diagnosis not present

## 2019-07-20 DIAGNOSIS — Z6841 Body Mass Index (BMI) 40.0 and over, adult: Secondary | ICD-10-CM | POA: Diagnosis not present

## 2019-07-21 DIAGNOSIS — E118 Type 2 diabetes mellitus with unspecified complications: Secondary | ICD-10-CM | POA: Diagnosis not present

## 2019-07-26 ENCOUNTER — Other Ambulatory Visit: Payer: Self-pay | Admitting: Cardiology

## 2019-08-02 DIAGNOSIS — Z0389 Encounter for observation for other suspected diseases and conditions ruled out: Secondary | ICD-10-CM | POA: Diagnosis not present

## 2019-08-02 DIAGNOSIS — R0989 Other specified symptoms and signs involving the circulatory and respiratory systems: Secondary | ICD-10-CM | POA: Diagnosis not present

## 2019-09-08 DIAGNOSIS — E118 Type 2 diabetes mellitus with unspecified complications: Secondary | ICD-10-CM | POA: Diagnosis not present

## 2019-09-20 DIAGNOSIS — M79674 Pain in right toe(s): Secondary | ICD-10-CM | POA: Diagnosis not present

## 2019-09-26 DIAGNOSIS — I872 Venous insufficiency (chronic) (peripheral): Secondary | ICD-10-CM | POA: Diagnosis not present

## 2019-09-26 DIAGNOSIS — L03032 Cellulitis of left toe: Secondary | ICD-10-CM | POA: Diagnosis not present

## 2019-09-26 DIAGNOSIS — E139 Other specified diabetes mellitus without complications: Secondary | ICD-10-CM | POA: Diagnosis not present

## 2019-09-26 DIAGNOSIS — L02612 Cutaneous abscess of left foot: Secondary | ICD-10-CM | POA: Diagnosis not present

## 2019-09-26 DIAGNOSIS — R609 Edema, unspecified: Secondary | ICD-10-CM | POA: Diagnosis not present

## 2019-10-11 DIAGNOSIS — L03032 Cellulitis of left toe: Secondary | ICD-10-CM | POA: Diagnosis not present

## 2019-11-03 ENCOUNTER — Other Ambulatory Visit: Payer: Self-pay | Admitting: Cardiology

## 2019-11-24 ENCOUNTER — Encounter: Payer: Self-pay | Admitting: Cardiology

## 2019-11-24 ENCOUNTER — Other Ambulatory Visit: Payer: Self-pay

## 2019-11-24 ENCOUNTER — Ambulatory Visit (INDEPENDENT_AMBULATORY_CARE_PROVIDER_SITE_OTHER): Payer: Medicare Other | Admitting: Cardiology

## 2019-11-24 VITALS — BP 146/87 | HR 105 | Ht 66.0 in | Wt 299.0 lb

## 2019-11-24 DIAGNOSIS — R600 Localized edema: Secondary | ICD-10-CM

## 2019-11-24 DIAGNOSIS — R000 Tachycardia, unspecified: Secondary | ICD-10-CM | POA: Diagnosis not present

## 2019-11-24 DIAGNOSIS — E8881 Metabolic syndrome: Secondary | ICD-10-CM

## 2019-11-24 DIAGNOSIS — I10 Essential (primary) hypertension: Secondary | ICD-10-CM | POA: Diagnosis not present

## 2019-11-24 MED ORDER — METOPROLOL SUCCINATE ER 50 MG PO TB24: 50.0000 mg | ORAL_TABLET | Freq: Every day | ORAL | 3 refills | Status: DC

## 2019-11-24 NOTE — Progress Notes (Signed)
Primary Care Provider: Deland Pretty, MD Cardiologist: No primary care provider on file. Electrophysiologist:   Clinic Note: Chief Complaint  Patient presents with  . Follow-up    Annual    HPI:    Sandy Daniels is a 69 y.o. female with a PMH below who presents today for delayed annual f/u - initially seen for DOE & HTN at the request of Deland Pretty, MD.  Sandy Daniels was last seen on in October 2019.  Major issue is that she is quite anxious.  She had sinus tachycardia noted on her EKG.  Somewhat rapid speech.  She also indicated having some ankle pain leading to his elevated heart rate. --> She felt as though her edema is well controlled with Dyazide, not requiring furosemide.  Blood pressure was doing quite well.  Noting intermittent aches and pains in her chest but nothing significant.  Staying very active, but still quite obese with deconditioning.  Recent Hospitalizations:   ER visit for epigastric pain in June 2020  Reviewed  CV studies:    The following studies were reviewed today: (if available, images/films reviewed: From Epic Chart or Care Everywhere) . None:   Interval History:   Sandy Daniels returns here today for follow-up really without to any cardiac symptoms.  She still has a little nagging twinges in her chest off and on, but not associated with any particular activity.  Usually at rest.  She is very anxious and has multiple questions.  She also had significant social stressors that we discussed.  As usual, but her being very anxious and rushing is a potential reason for her having elevated heart rate, however despite that, at the end of the interview, she still had a heart rate of 95. She remains morbidly obese and is very frustrated because she says that she does try doing routine walking and is trying to cut back on her diet, but has not been to lose weight. 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  Thankfully overall her cardiac standpoint she is doing fine.  Although her heart rate is fast today, she does not feel palpitations or rapid heartbeats.  When she starts exerting herself she does note that her heart rate goes up quite fast.  She will get short of breath but is mostly would be related to deconditioning than anything else.  No chest pain or pressure with rest or exertion.  CV Review of Symptoms (Summary): no chest pain or dyspnea on exertion positive for - rapid heart rate and However not very symptomatic.  Some positional dizziness.   negative for - edema, irregular heartbeat, orthopnea, paroxysmal nocturnal dyspnea, shortness of breath or Syncope/near syncope, TIA/amaurosis fugax, claudication   The patient does not have symptoms concerning for COVID-19 infection (fever, chills, cough, or new shortness of breath).  The patient is practicing social distancing & Masking.    REVIEWED OF SYSTEMS   A comprehensive ROS was performed. Review of Systems  Constitutional: Negative for malaise/fatigue (Just exercise intolerance because of deconditioning.  She is still trying to walk.) and weight loss.  HENT: Negative for congestion and nosebleeds.   Respiratory:       Per HPI  Cardiovascular: Positive for leg swelling (But mostly related to ankle pain.).  Gastrointestinal: Negative for blood in stool, constipation, heartburn and  melena.  Genitourinary: Positive for urgency (Mostly nocturia.). Negative for hematuria.  Musculoskeletal: Positive for joint pain. Negative for falls.  Neurological: Positive for dizziness (Off and on, mostly when she tries to know quickly.). Negative for focal weakness and weakness.  Psychiatric/Behavioral: Negative for memory loss. The patient is nervous/anxious. The patient does not have insomnia.        Lots of social stress as noted below in the  Social History section.   I have reviewed and (if needed) personally updated the patient's problem list, medications, allergies, past medical and surgical history, social and family history.   PAST MEDICAL HISTORY   Past Medical History:  Diagnosis Date  . Anxiety   . Diabetes mellitus without complication ([place])   . Diverticulitis large intestine 01/2013  . GERD (gastroesophageal reflux disease)   . Goiter   . History of Helicobacter pylori infection 05/25/2003   gastritis at EGD  . Hypertension   . Hypothyroidism    On thyroid supplementation  . Morbid obesity with BMI of 45.0-49.9, adult ([place])   . OA (osteoarthritis)   . Personal history of colonic adenomas 06/12/2013  . Seborrheic dermatitis   . Tendonitis of ankle   . Vitamin D deficiency      PAST SURGICAL HISTORY   Past Surgical History:  Procedure Laterality Date  . COLONOSCOPY  03/15/2002   internal hemorrhoids, Dr. Jim Desanctis  . ESOPHAGOGASTRODUODENOSCOPY  05/25/2003   H. Pylori, Dr. Jim Desanctis  . NM MYOVIEW LTD  10/08/2011   LOW RISK study. EF 68%. Breast attenuation artifact noted otherwise normal study.  . TONSILLECTOMY    . TRANSTHORACIC ECHOCARDIOGRAM  10/08/2011   Normal study. No valve lesions. Normal LV function, EF >55%, GR 1 DD. No valve lesions  . UPPER GASTROINTESTINAL ENDOSCOPY    . VAGINAL HYSTERECTOMY       MEDICATIONS/ALLERGIES   Current Meds  Medication Sig  . calcium carbonate (TUMS - DOSED IN MG ELEMENTAL CALCIUM) 500 MG chewable tablet Chew 1 tablet by mouth daily.  . ciprofloxacin (CIPRO) 500 MG tablet Take 1 tablet (500 mg total) by mouth every 12 (twelve) hours.  . Dulaglutide (TRULICITY) 1.5 0000000 SOPN Inject 1 Dose into the skin once a week.  . levothyroxine (SYNTHROID, LEVOTHROID) 100 MCG tablet Take 100 mcg by mouth daily before breakfast.  . LINZESS 290 MCG CAPS capsule TAKE 1 CAPSULE (290 MCG TOTAL) BY MOUTH DAILY.  . metFORMIN (GLUCOPHAGE-XR) 500 MG 24 hr tablet Take 500 mg  by mouth 2 (two) times daily.  . rosuvastatin (CRESTOR) 10 MG tablet Take 10 mg by mouth daily.  Marland Kitchen triamterene-hydrochlorothiazide (DYAZIDE) 37.5-25 MG capsule Take 1 capsule by mouth every morning.    Allergies  Allergen Reactions  . Betadine [Povidone Iodine] Rash    Severe bumps and blistering  . Neosporin [Neomycin-Bacitracin Zn-Polymyx] Rash    blistering  . Nexium [Esomeprazole Magnesium] Other (See Comments)    Headache  . Penicillins Hives and Swelling    Did it involve swelling of the face/tongue/throat, SOB, or low BP? No Did it involve sudden or severe rash/hives, skin peeling, or any reaction on the inside of your mouth or nose? No Did you need to seek medical attention at a hospital or doctor's office? No When did it last happen? If all above answers are "NO", may proceed with cephalosporin use.     SOCIAL HISTORY/FAMILY HISTORY   Social History   Tobacco Use  . Smoking status: Former Smoker    Types: Cigarettes  Quit date: 01/03/2013    Years since quitting: 6.8  . Smokeless tobacco: Never Used  Substance Use Topics  . Alcohol use: No  . Drug use: No   Social History   Social History Narrative   [employer]   Divorced   1 daughter   She does significant out of volunteer work with 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   --> She has been quite distressed with the fact that she is not able to do her routine fundraising due to COVID-19 restrictions.   --She has been under quite a bit of stress at home with several things happening, all of which she is helping to try to manage and keep everybody together.  Her is very sick now unexpectedly committed suicide recently, opting to take his life when his wife went to work.  Her sister, now widow is quite sick with her own health problems.  Her brother is very sick with PAD and other major conditions.  Family History family history  includes Allergic rhinitis in her father; Dementia in her father; Diabetes in her father and mother; Heart disease in her father and mother; Hypertension in her father.   OBJCTIVE -PE, EKG, labs   Wt Readings from Last 3 Encounters:  11/24/19 299 lb (135.6 kg)  08/10/18 (!) 304 lb (137.9 kg)  08/05/17 300 lb (136.1 kg)    Physical Exam: BP (!) 146/87   Pulse (!) 105   Ht 5\' 6"  (1.676 m)   Wt 299 lb (135.6 kg)   SpO2 99%   BMI 48.26 kg/m  Physical Exam  Constitutional: She is oriented to person, place, and time. She appears well-nourished. No distress.  Morbidly obese, well-groomed.  HENT:  Head: Normocephalic and atraumatic.  Neck: No hepatojugular reflux and no JVD (Difficult to assess) present. Carotid bruit is not present. No thyromegaly present.  Cardiovascular: Normal rate, regular rhythm, S1 normal and S2 normal.  Occasional extrasystoles are present. PMI is not displaced (Unable to assess). Exam reveals distant heart sounds and decreased pulses (Difficult to palpate pedal pulses, but feet are warm). Exam reveals no gallop and no friction rub.  No murmur heard. Pulmonary/Chest: Effort normal. No respiratory distress. She has no wheezes. She has no rales.  Distant breath sounds  Abdominal: Soft. Bowel sounds are normal. She exhibits no distension. There is no abdominal tenderness. There is no rebound.  Significant truncal obesity.  Unable to assess HSM  Musculoskeletal:        General: No edema (Difficult to tell if it is swelling or just body habitus.). Normal range of motion.     Cervical back: Normal range of motion and neck supple.  Neurological: She is alert and oriented to person, place, and time.  Skin: Skin is warm and dry.  Psychiatric: Her behavior is normal. Judgment and thought content normal.  Somewhat anxious but pleasant mood and affect.  Vitals reviewed.    Adult ECG Report  Rate: 105 ;  Rhythm: sinus tachycardia and Rightward axis.  Otherwise normal  intervals durations.;   Narrative Interpretation: Basically stable EKG.  Recent Labs:   Most recent lipids (September 2020) TC 90, TG 129, HDL 34.  Previous LDL as of March 2019 was 29. No results found for: CHOL, HDL, LDLCALC, LDLDIRECT, TRIG, CHOLHDL Lab Results  Component Value Date   CREATININE 0.94 04/30/2019   BUN 8 04/30/2019   NA 130 (L) 04/30/2019   K 3.5 04/30/2019  CL 96 (L) 04/30/2019   CO2 21 (L) 04/30/2019    ASSESSMENT/PLAN    Problem List Items Addressed This Visit    Metabolic syndrome (Chronic)    Morbid obesity, diabetes and hypertension.  Lipids seem to be pretty well controlled with rosuvastatin.  Not currently on SGLT2 inhibitor, but on Metformin and GLP-1 agonist.  Continue to counsel on dietary adjustment.  I think she probably would benefit from dietary counseling.  She is trying to walk and get get exercise.  Hopefully with continued efforts, weight loss will come..      Relevant Orders   EKG 12-Lead   Bilateral edema of lower extremity (Chronic)    Not really noticing any significant edema on current dose of Dyazide.      Essential hypertension (Chronic)    Her blood pressure actually little up today and with significant tachycardia, I think we can probably titrate up her Toprol to 50 mg.  She never started the losartan in the past and hopefully we can keep with just the beta-blocker and her Dyazide.      Relevant Medications   metoprolol succinate (TOPROL-XL) 50 MG 24 hr tablet   Other Relevant Orders   EKG 12-Lead   Sinus tachycardia by electrocardiogram - Primary (Chronic)    She still has pretty significant tachycardia.  Certainly has some baseline anxiety that is driving this along with obesity..  Plan: Increase Toprol to 50 mg daily.      Relevant Orders   EKG 12-Lead       COVID-19 Education: The signs and symptoms of COVID-19 were discussed with the patient and how to seek care for testing (follow up with PCP or arrange  E-visit).   The importance of social distancing was discussed today.  I spent a total of 22 minutes with the patient. >  50% of the time was spent in direct patient consultation.  Although she does not have significant cardiac conditions, she is very anxious.  She took some time explaining many of the social stresses going on.  She has multiple questions that were answered. Additional time spent with chart review (studies, outside notes, etc): 6 Total Time: 28 min   Current medicines are reviewed at length with the patient today.  (+/- concerns) none   Patient Instructions / Medication Changes & Studies & Tests Ordered   Patient Instructions  Medication Instructions:  INCREASE metoprolol succinate to 50mg  daily  *If you need a refill on your cardiac medications before your next appointment, please call your pharmacy*  Follow-Up: At [facility], you and your health needs are our priority.  As part of our continuing mission to provide you with exceptional heart care, we have created designated Provider Care Teams.  These Care Teams include your primary Cardiologist (physician) and Advanced Practice Providers (APPs -  Physician Assistants and Nurse Practitioners) who all work together to provide you with the care you need, when you need it.  Your next appointment:   12 month(s)  The format for your next appointment:   In Person  Provider:   You may see Dr. Ellyn Hack or one of the following Advanced Practice Providers on your designated Care Team:    Rosaria Ferries, PA-C  Jory Sims, DNP, ANP  Cadence Kathlen Mody, NP   Other Instructions      Studies Ordered:   Orders Placed This Encounter  Procedures  . EKG 12-Lead     Glenetta Hew, M.D., M.S. Interventional Cardiologist   Pager # [phone]  Phone # [phone] [address] [place], [place] [address]   Thank you for choosing [facility] at [facility]!!

## 2019-11-24 NOTE — Patient Instructions (Signed)
Medication Instructions:  INCREASE metoprolol succinate to 50mg  daily  *If you need a refill on your cardiac medications before your next appointment, please call your pharmacy*  Follow-Up: At [facility], you and your health needs are our priority.  As part of our continuing mission to provide you with exceptional heart care, we have created designated Provider Care Teams.  These Care Teams include your primary Cardiologist (physician) and Advanced Practice Providers (APPs -  Physician Assistants and Nurse Practitioners) who all work together to provide you with the care you need, when you need it.  Your next appointment:   12 month(s)  The format for your next appointment:   In Person  Provider:   You may see Dr. Ellyn Hack or one of the following Advanced Practice Providers on your designated Care Team:    Rosaria Ferries, PA-C  Jory Sims, DNP, ANP  Cadence Kathlen Mody, NP   Other Instructions

## 2019-11-25 ENCOUNTER — Encounter: Payer: Self-pay | Admitting: Cardiology

## 2019-11-25 NOTE — Assessment & Plan Note (Signed)
She still has pretty significant tachycardia.  Certainly has some baseline anxiety that is driving this along with obesity..  Plan: Increase Toprol to 50 mg daily.

## 2019-11-25 NOTE — Assessment & Plan Note (Signed)
Not really noticing any significant edema on current dose of Dyazide.

## 2019-11-25 NOTE — Assessment & Plan Note (Signed)
Her blood pressure actually little up today and with significant tachycardia, I think we can probably titrate up her Toprol to 50 mg.  She never started the losartan in the past and hopefully we can keep with just the beta-blocker and her Dyazide.

## 2019-11-25 NOTE — Assessment & Plan Note (Addendum)
Morbid obesity, diabetes and hypertension.  Lipids seem to be pretty well controlled with rosuvastatin.  Not currently on SGLT2 inhibitor, but on Metformin and GLP-1 agonist.  Continue to counsel on dietary adjustment.  I think she probably would benefit from dietary counseling.  She is trying to walk and get get exercise.  Hopefully with continued efforts, weight loss will come.Sandy Daniels

## 2019-11-26 ENCOUNTER — Ambulatory Visit: Payer: Medicare Other | Attending: Internal Medicine

## 2019-11-26 DIAGNOSIS — Z23 Encounter for immunization: Secondary | ICD-10-CM | POA: Insufficient documentation

## 2019-11-27 NOTE — Progress Notes (Signed)
   Covid-19 Vaccination Clinic  Name:  Sandy Daniels    MRN: [id number] DOB: Jun 09, 1951  11/26/2019  [name] was observed post Covid-19 immunization for 15 minutes without incidence. She was provided with Vaccine Information Sheet and instruction to access the V-Safe system.   [name] was instructed to call 911 with any severe reactions post vaccine: Marland Kitchen Difficulty breathing  . Swelling of your face and throat  . A fast heartbeat  . A bad rash all over your body  . Dizziness and weakness    Immunizations Administered    Name Date Dose VIS Date Route   Moderna COVID-19 Vaccine 11/26/2019  2:12 PM 0.5 mL 10/03/2019 Intramuscular   Manufacturer: Levan Hurst   LotAR:[id number]   [place]:[id number]      Documented on behalf of: C. Hubbard

## 2019-11-29 ENCOUNTER — Encounter: Payer: Self-pay | Admitting: Internal Medicine

## 2019-12-24 ENCOUNTER — Ambulatory Visit: Payer: Medicare Other | Attending: Internal Medicine

## 2019-12-24 DIAGNOSIS — Z23 Encounter for immunization: Secondary | ICD-10-CM | POA: Insufficient documentation

## 2019-12-24 NOTE — Progress Notes (Signed)
   Covid-19 Vaccination Clinic  Name:  Sandy Daniels    MRN: [id number] DOB: 07/18/1951  12/24/2019  Ms. Shade was observed post Covid-19 immunization for 15 minutes without incidence. She was provided with Vaccine Information Sheet and instruction to access the V-Safe system.   [name] was instructed to call 911 with any severe reactions post vaccine: Marland Kitchen Difficulty breathing  . Swelling of your face and throat  . A fast heartbeat  . A bad rash all over your body  . Dizziness and weakness    Immunizations Administered    Name Date Dose VIS Date Route   Moderna COVID-19 Vaccine 12/24/2019  1:49 PM 0.5 mL 10/03/2019 Intramuscular   Manufacturer: Moderna   Lot: [id number]   [place]:[id number]

## 2020-01-12 ENCOUNTER — Ambulatory Visit (AMBULATORY_SURGERY_CENTER): Payer: Self-pay | Admitting: *Deleted

## 2020-01-12 ENCOUNTER — Other Ambulatory Visit: Payer: Self-pay

## 2020-01-12 VITALS — Temp 95.2°F | Ht 66.0 in | Wt 304.0 lb

## 2020-01-12 DIAGNOSIS — Z86010 Personal history of colonic polyps: Secondary | ICD-10-CM

## 2020-01-12 NOTE — Progress Notes (Signed)
Patient is here in-person for PV. Patient denies any allergies to eggs or soy. Patient denies any problems with anesthesia/sedation. Patient denies any oxygen use at home. Patient denies taking any diet/weight loss medications or blood thinners. Patient is not being treated for MRSA or C-diff.  COVID-19 screening test is not needed, pt has had both covid vaccines, 2nd dose on 12/24/2019. Per pt.  Patient is aware of our care-partner policy and 0000000 safety regulations.

## 2020-01-26 ENCOUNTER — Ambulatory Visit (AMBULATORY_SURGERY_CENTER): Payer: Medicare Other | Admitting: Internal Medicine

## 2020-01-26 ENCOUNTER — Encounter: Payer: Self-pay | Admitting: Internal Medicine

## 2020-01-26 ENCOUNTER — Other Ambulatory Visit: Payer: Self-pay

## 2020-01-26 VITALS — BP 144/91 | HR 74 | Temp 97.8°F | Resp 16 | Ht 66.0 in | Wt 304.0 lb

## 2020-01-26 DIAGNOSIS — Z86010 Personal history of colonic polyps: Secondary | ICD-10-CM | POA: Diagnosis not present

## 2020-01-26 DIAGNOSIS — Z1211 Encounter for screening for malignant neoplasm of colon: Secondary | ICD-10-CM | POA: Diagnosis not present

## 2020-01-26 DIAGNOSIS — D125 Benign neoplasm of sigmoid colon: Secondary | ICD-10-CM

## 2020-01-26 DIAGNOSIS — D124 Benign neoplasm of descending colon: Secondary | ICD-10-CM

## 2020-01-26 MED ORDER — LINACLOTIDE 290 MCG PO CAPS: 290.0000 ug | ORAL_CAPSULE | Freq: Every day | ORAL | 3 refills | Status: DC

## 2020-01-26 MED ORDER — SODIUM CHLORIDE 0.9 % IV SOLN: 500.0000 mL | Freq: Once | INTRAVENOUS | Status: DC

## 2020-01-26 NOTE — Progress Notes (Signed)
Pt's states no medical or surgical changes since previsit or office visit.  LC - temp DT - vitals 

## 2020-01-26 NOTE — Progress Notes (Signed)
To PACU, VSS. Report to [email or web address] 

## 2020-01-26 NOTE — Progress Notes (Signed)
Called to room to assist during endoscopic procedure.  Patient ID and intended procedure confirmed with present staff. Received instructions for my participation in the procedure from the performing physician.  

## 2020-01-26 NOTE — Op Note (Signed)
Sandy Daniels Patient Name: Sandy Daniels Procedure Date: 01/26/2020 8:03 AM MRN: [id number] Endoscopist: Gatha Mayer , MD Age: 69 Referring MD:  Date of Birth: [birth year] Gender: Female Account #: [id number] Procedure:                Colonoscopy Indications:              Surveillance: Personal history of adenomatous                            polyps on last colonoscopy 3 years ago Medicines:                Propofol per Anesthesia, Monitored Anesthesia Care Procedure:                Pre-Anesthesia Assessment:                           - Prior to the procedure, a History and Physical                            was performed, and patient medications and                            allergies were reviewed. The patient's tolerance of                            previous anesthesia was also reviewed. The risks                            and benefits of the procedure and the sedation                            options and risks were discussed with the patient.                            All questions were answered, and informed consent                            was obtained. Prior Anticoagulants: The patient has                            taken no previous anticoagulant or antiplatelet                            agents. ASA Grade Assessment: III - A patient with                            severe systemic disease. After reviewing the risks                            and benefits, the patient was deemed in                            satisfactory condition to undergo the procedure.  After obtaining informed consent, the colonoscope                            was passed under direct vision. Throughout the                            procedure, the patient's blood pressure, pulse, and                            oxygen saturations were monitored continuously. The                            Colonoscope was introduced through the anus and   advanced to the the cecum, identified by                            appendiceal orifice and ileocecal valve. The                            colonoscopy was performed without difficulty. The                            patient tolerated the procedure well. The quality                            of the bowel preparation was excellent. The                            ileocecal valve, appendiceal orifice, and rectum                            were photographed. The bowel preparation used was                            Miralax via split dose instruction. Scope In: 8:11:26 AM Scope Out: 8:27:37 AM Scope Withdrawal Time: 0 hours 13 minutes 15 seconds  Total Procedure Duration: 0 hours 16 minutes 11 seconds  Findings:                 The perianal and digital rectal examinations were                            normal.                           Three sessile polyps were found in the sigmoid                            colon and descending colon. The polyps were                            diminutive in size. These polyps were removed with                            a cold snare. Resection and retrieval were  complete. Verification of patient identification                            for the specimen was done. Estimated blood loss was                            minimal.                           Multiple diverticula were found in the sigmoid                            colon.                           The exam was otherwise without abnormality on                            direct and retroflexion views. Complications:            No immediate complications. Estimated Blood Loss:     Estimated blood loss was minimal. Impression:               - Three diminutive polyps in the sigmoid colon and                            in the descending colon, removed with a cold snare.                            Resected and retrieved.                           - Diverticulosis in the sigmoid  colon.                           - The examination was otherwise normal on direct                            and retroflexion views.                           - Personal history of colonic polyps. Multiple                            adenomas and an advanced adenoma Recommendation:           - Patient has a contact number available for                            emergencies. The signs and symptoms of potential                            delayed complications were discussed with the                            patient. Return to normal activities tomorrow.  Written discharge instructions were provided to the                            patient.                           - Resume previous diet.                           - Continue present medications.                           - Repeat colonoscopy is recommended for                            surveillance. The colonoscopy date will be                            determined after pathology results from today's                            exam become available for review. Gatha Mayer, MD 01/26/2020 8:35:27 AM This report has been signed electronically.

## 2020-01-26 NOTE — Patient Instructions (Addendum)
I found and removed 3 tiny polyps and saw the diverticulosis again. Cleanout was great - good job!  I will let you know pathology results and when to have another routine colonoscopy by mail and/or My Chart.  I appreciate the opportunity to care for you. Gatha Mayer, MD, [facility]  Please, read all of the handouts given to you by your recovery room nurse.  Thank-you for choosing [place] for your healthcare needs today.  YOU HAD AN ENDOSCOPIC PROCEDURE TODAY AT [facility]:   Refer to the procedure report that was given to you for any specific questions about what was found during the examination.  If the procedure report does not answer your questions, please call your gastroenterologist to clarify.  If you requested that your care partner not be given the details of your procedure findings, then the procedure report has been included in a sealed envelope for you to review at your convenience later.  YOU SHOULD EXPECT: Some feelings of bloating in the abdomen. Passage of more gas than usual.  Walking can help get rid of the air that was put into your GI tract during the procedure and reduce the bloating. If you had a lower endoscopy (such as a colonoscopy or flexible sigmoidoscopy) you may notice spotting of blood in your stool or on the toilet paper. If you underwent a bowel prep for your procedure, you may not have a normal bowel movement for a few days.  Please Note:  You might notice some irritation and congestion in your nose or some drainage.  This is from the oxygen used during your procedure.  There is no need for concern and it should clear up in a day or so.  SYMPTOMS TO REPORT IMMEDIATELY:   Following lower endoscopy (colonoscopy or flexible sigmoidoscopy):  Excessive amounts of blood in the stool  Significant tenderness or worsening of abdominal pains  Swelling of the abdomen that is new, acute  Fever of 100F or higher   For urgent or emergent issues, a  gastroenterologist can be reached at any hour by calling [phone]. Do not use MyChart messaging for urgent concerns.    DIET:  We do recommend a small meal at first, but then you may proceed to your regular diet.  Drink plenty of fluids but you should avoid alcoholic beverages for 24 hours.  ACTIVITY:  You should plan to take it easy for the rest of today and you should NOT DRIVE or use heavy machinery until tomorrow (because of the sedation medicines used during the test).    FOLLOW UP: Our staff will call the number listed on your records 48-72 hours following your procedure to check on you and address any questions or concerns that you may have regarding the information given to you following your procedure. If we do not reach you, we will leave a message.  We will attempt to reach you two times.  During this call, we will ask if you have developed any symptoms of COVID 19. If you develop any symptoms (ie: fever, flu-like symptoms, shortness of breath, cough etc.) before then, please call [phone].  If you test positive for Covid 19 in the 2 weeks post procedure, please call and report this information to [place].    If any biopsies were taken you will be contacted by phone or by letter within the next 1-3 weeks.  Please call us at [phone] if you have not heard about the biopsies in 3 weeks.  SIGNATURES/CONFIDENTIALITY: You and/or your care partner have signed paperwork which will be entered into your electronic medical record.  These signatures attest to the fact that that the information above on your After Visit Summary has been reviewed and is understood.  Full responsibility of the confidentiality of this discharge information lies with you and/or your care-partner.

## 2020-01-30 ENCOUNTER — Telehealth: Payer: Self-pay | Admitting: *Deleted

## 2020-01-30 NOTE — Telephone Encounter (Signed)
  Follow up Call-  Call back number 01/26/2020  Post procedure Call Back phone  # [phone]  Permission to leave phone message Yes  Some recent data might be hidden     Patient questions:  Message left to call us if necessary.

## 2020-01-31 ENCOUNTER — Encounter: Payer: Self-pay | Admitting: Internal Medicine

## 2020-02-05 DIAGNOSIS — I10 Essential (primary) hypertension: Secondary | ICD-10-CM | POA: Diagnosis not present

## 2020-03-29 ENCOUNTER — Telehealth: Payer: Self-pay | Admitting: Internal Medicine

## 2020-03-29 NOTE — Telephone Encounter (Signed)
Pt requested a call back to discuss results of colonoscopy.

## 2020-03-29 NOTE — Telephone Encounter (Signed)
Patient notified of the results of the procedures.  All questions answered.  She will call back for any additional questions or concerns.

## 2020-05-19 ENCOUNTER — Telehealth: Payer: Self-pay | Admitting: Physician Assistant

## 2020-05-19 NOTE — Telephone Encounter (Signed)
Patient with history of atrial tachycardia patient after hour answering service due to episode of squeezing sensation on the left side of the breast.  This only lasted about a second before resolving.  I suspect she likely had a either PVC or pause.  However patient denies any dizziness, chest pain or shortness of breath.  At this time I recommended continued observation.  We can see the patient earlier in the office to make sure no further episodes of recurrence.  She is to contact our office tomorrow morning.

## 2020-05-20 ENCOUNTER — Telehealth: Payer: Self-pay | Admitting: Cardiology

## 2020-05-20 NOTE — Telephone Encounter (Signed)
Spoke to patient. She states she called   The doctor on call yesterday.  she states it is fleeting flutter more towards her armpit.  No chest pain , no shortness of breath.  No palpations. It occurred yesterday , and once this morning. Blood pressure earlier 123/70  And pulse in the 80's.  RN informed patient to continue to monitor and contact office if needed.  patient verbalized understanding.

## 2020-05-20 NOTE — Telephone Encounter (Signed)
    Pt feels like fluttering on her left breast. She said she doesn't have any other symptoms. But she would like to speak with a nurse about it

## 2020-05-24 DIAGNOSIS — I10 Essential (primary) hypertension: Secondary | ICD-10-CM | POA: Diagnosis not present

## 2020-05-24 DIAGNOSIS — R253 Fasciculation: Secondary | ICD-10-CM | POA: Diagnosis not present

## 2020-07-31 ENCOUNTER — Encounter: Payer: Self-pay | Admitting: Cardiology

## 2020-07-31 DIAGNOSIS — E7800 Pure hypercholesterolemia, unspecified: Secondary | ICD-10-CM | POA: Diagnosis not present

## 2020-07-31 DIAGNOSIS — E039 Hypothyroidism, unspecified: Secondary | ICD-10-CM | POA: Diagnosis not present

## 2020-07-31 DIAGNOSIS — E119 Type 2 diabetes mellitus without complications: Secondary | ICD-10-CM | POA: Diagnosis not present

## 2020-07-31 DIAGNOSIS — Z0000 Encounter for general adult medical examination without abnormal findings: Secondary | ICD-10-CM | POA: Diagnosis not present

## 2020-07-31 DIAGNOSIS — E559 Vitamin D deficiency, unspecified: Secondary | ICD-10-CM | POA: Diagnosis not present

## 2020-07-31 DIAGNOSIS — I10 Essential (primary) hypertension: Secondary | ICD-10-CM | POA: Diagnosis not present

## 2020-07-31 DIAGNOSIS — N390 Urinary tract infection, site not specified: Secondary | ICD-10-CM | POA: Diagnosis not present

## 2020-08-05 DIAGNOSIS — I10 Essential (primary) hypertension: Secondary | ICD-10-CM | POA: Diagnosis not present

## 2020-08-05 DIAGNOSIS — E039 Hypothyroidism, unspecified: Secondary | ICD-10-CM | POA: Diagnosis not present

## 2020-08-05 DIAGNOSIS — E7800 Pure hypercholesterolemia, unspecified: Secondary | ICD-10-CM | POA: Diagnosis not present

## 2020-08-05 DIAGNOSIS — R7989 Other specified abnormal findings of blood chemistry: Secondary | ICD-10-CM | POA: Diagnosis not present

## 2020-08-05 DIAGNOSIS — Z0000 Encounter for general adult medical examination without abnormal findings: Secondary | ICD-10-CM | POA: Diagnosis not present

## 2020-08-05 DIAGNOSIS — Z23 Encounter for immunization: Secondary | ICD-10-CM | POA: Diagnosis not present

## 2020-08-05 DIAGNOSIS — E1165 Type 2 diabetes mellitus with hyperglycemia: Secondary | ICD-10-CM | POA: Diagnosis not present

## 2020-08-05 DIAGNOSIS — K219 Gastro-esophageal reflux disease without esophagitis: Secondary | ICD-10-CM | POA: Diagnosis not present

## 2020-08-13 DIAGNOSIS — E039 Hypothyroidism, unspecified: Secondary | ICD-10-CM | POA: Diagnosis not present

## 2020-08-13 DIAGNOSIS — I10 Essential (primary) hypertension: Secondary | ICD-10-CM | POA: Diagnosis not present

## 2020-08-13 DIAGNOSIS — E7800 Pure hypercholesterolemia, unspecified: Secondary | ICD-10-CM | POA: Diagnosis not present

## 2020-08-13 DIAGNOSIS — E1165 Type 2 diabetes mellitus with hyperglycemia: Secondary | ICD-10-CM | POA: Diagnosis not present

## 2020-08-16 DIAGNOSIS — H1045 Other chronic allergic conjunctivitis: Secondary | ICD-10-CM | POA: Diagnosis not present

## 2020-08-16 DIAGNOSIS — H524 Presbyopia: Secondary | ICD-10-CM | POA: Diagnosis not present

## 2020-08-16 DIAGNOSIS — H25093 Other age-related incipient cataract, bilateral: Secondary | ICD-10-CM | POA: Diagnosis not present

## 2020-08-16 DIAGNOSIS — E119 Type 2 diabetes mellitus without complications: Secondary | ICD-10-CM | POA: Diagnosis not present

## 2020-08-16 DIAGNOSIS — H5213 Myopia, bilateral: Secondary | ICD-10-CM | POA: Diagnosis not present

## 2020-08-16 DIAGNOSIS — Z7984 Long term (current) use of oral hypoglycemic drugs: Secondary | ICD-10-CM | POA: Diagnosis not present

## 2020-08-16 DIAGNOSIS — H52223 Regular astigmatism, bilateral: Secondary | ICD-10-CM | POA: Diagnosis not present

## 2020-09-03 DIAGNOSIS — Z23 Encounter for immunization: Secondary | ICD-10-CM | POA: Diagnosis not present

## 2020-09-04 DIAGNOSIS — E039 Hypothyroidism, unspecified: Secondary | ICD-10-CM | POA: Diagnosis not present

## 2020-09-04 DIAGNOSIS — E1165 Type 2 diabetes mellitus with hyperglycemia: Secondary | ICD-10-CM | POA: Diagnosis not present

## 2020-09-04 DIAGNOSIS — R300 Dysuria: Secondary | ICD-10-CM | POA: Diagnosis not present

## 2020-09-04 DIAGNOSIS — I10 Essential (primary) hypertension: Secondary | ICD-10-CM | POA: Diagnosis not present

## 2020-09-04 DIAGNOSIS — E7800 Pure hypercholesterolemia, unspecified: Secondary | ICD-10-CM | POA: Diagnosis not present

## 2020-09-18 DIAGNOSIS — Z1272 Encounter for screening for malignant neoplasm of vagina: Secondary | ICD-10-CM | POA: Diagnosis not present

## 2020-09-18 DIAGNOSIS — N952 Postmenopausal atrophic vaginitis: Secondary | ICD-10-CM | POA: Diagnosis not present

## 2020-09-18 DIAGNOSIS — Z124 Encounter for screening for malignant neoplasm of cervix: Secondary | ICD-10-CM | POA: Diagnosis not present

## 2020-09-18 DIAGNOSIS — Z6841 Body Mass Index (BMI) 40.0 and over, adult: Secondary | ICD-10-CM | POA: Diagnosis not present

## 2020-10-03 DIAGNOSIS — E7800 Pure hypercholesterolemia, unspecified: Secondary | ICD-10-CM | POA: Diagnosis not present

## 2020-10-03 DIAGNOSIS — I10 Essential (primary) hypertension: Secondary | ICD-10-CM | POA: Diagnosis not present

## 2020-10-03 DIAGNOSIS — E1165 Type 2 diabetes mellitus with hyperglycemia: Secondary | ICD-10-CM | POA: Diagnosis not present

## 2020-10-03 DIAGNOSIS — E039 Hypothyroidism, unspecified: Secondary | ICD-10-CM | POA: Diagnosis not present

## 2020-10-17 DIAGNOSIS — Z1231 Encounter for screening mammogram for malignant neoplasm of breast: Secondary | ICD-10-CM | POA: Diagnosis not present

## 2020-11-21 ENCOUNTER — Encounter: Payer: Self-pay | Admitting: Gastroenterology

## 2020-11-21 ENCOUNTER — Ambulatory Visit (INDEPENDENT_AMBULATORY_CARE_PROVIDER_SITE_OTHER): Payer: Medicare Other | Admitting: Gastroenterology

## 2020-11-21 ENCOUNTER — Telehealth: Payer: Self-pay | Admitting: Internal Medicine

## 2020-11-21 VITALS — BP 132/72 | HR 100 | Ht 66.5 in | Wt 284.0 lb

## 2020-11-21 DIAGNOSIS — K5900 Constipation, unspecified: Secondary | ICD-10-CM | POA: Diagnosis not present

## 2020-11-21 DIAGNOSIS — R109 Unspecified abdominal pain: Secondary | ICD-10-CM | POA: Insufficient documentation

## 2020-11-21 DIAGNOSIS — K589 Irritable bowel syndrome without diarrhea: Secondary | ICD-10-CM | POA: Diagnosis not present

## 2020-11-21 MED ORDER — HYOSCYAMINE SULFATE 0.125 MG PO TABS: 0.1250 mg | ORAL_TABLET | Freq: Four times a day (QID) | ORAL | 1 refills | Status: DC | PRN

## 2020-11-21 NOTE — Telephone Encounter (Signed)
Inbound call from patient stating she is having abdominal pain, thinks she is have a diverticulitis flare up and is wanting to know if something can be sent to her pharmacy to help with it.  Please advise.

## 2020-11-21 NOTE — Progress Notes (Signed)
11/21/2020 Sandy Daniels [id number] Aug 27, 1951   HISTORY OF PRESENT ILLNESS: This is a pleasant 70 year old female is a patient of Dr. Celesta Aver.  She follows here for her chronic constipation and is on Linzess 290 mcg daily.  She is here today with complaints of generalized mid abdominal pain.  She has a remote history of diverticulitis.  She says that the symptoms have just been present for the past few days.  She expresses that she is under a ton of stress recently with the recent passing of her niece.  She is planning her funeral, which is tomorrow.  There have been several other aspects that have caused her a lot of stress and anxiety as well and she is not sleeping at night.  She describes it as a cramping in her mid abdomen.  She denies any associated nausea, vomiting, fevers, chills.  She has not taken her Linzess in a couple of days so she feels somewhat constipated.  Her last colonoscopy was just in March 2021 at which time she was found to have diverticulosis in the sigmoid colon and 3 polyps were removed.  One was a tubular adenoma the other hyperplastic polyp and another benign polypoid mucosa.  Recall recommended 5-year interval.   Past Medical History:  Diagnosis Date  . Anxiety   . Diabetes mellitus without complication ([place])   . Diverticulitis large intestine 01/2013  . GERD (gastroesophageal reflux disease)   . Goiter   . History of Helicobacter pylori infection 05/25/2003   gastritis at EGD  . Hyperlipidemia   . Hypertension   . Hypothyroidism    On thyroid supplementation  . Morbid obesity with BMI of 45.0-49.9, adult ([place])   . OA (osteoarthritis)   . Personal history of colonic adenomas 06/12/2013  . Seborrheic dermatitis   . Tendonitis of ankle   . Vitamin D deficiency    Past Surgical History:  Procedure Laterality Date  . COLONOSCOPY  03/15/2002, 01/22/2017   internal hemorrhoids, Dr. Jim Desanctis  . ESOPHAGOGASTRODUODENOSCOPY  05/25/2003   H. Pylori, Dr.  Jim Desanctis  . NM MYOVIEW LTD  10/08/2011   LOW RISK study. EF 68%. Breast attenuation artifact noted otherwise normal study.  Marland Kitchen POLYPECTOMY    . TONSILLECTOMY    . TRANSTHORACIC ECHOCARDIOGRAM  10/08/2011   Normal study. No valve lesions. Normal LV function, EF >55%, GR 1 DD. No valve lesions  . UPPER GASTROINTESTINAL ENDOSCOPY    . VAGINAL HYSTERECTOMY      reports that she quit smoking about 7 years ago. Her smoking use included cigarettes. She has never used smokeless tobacco. She reports that she does not drink alcohol and does not use drugs. family history includes Allergic rhinitis in her father; Dementia in her father; Diabetes in her father and mother; Heart disease in her father and mother; Hypertension in her father. Allergies  Allergen Reactions  . Betadine [Povidone Iodine] Rash    Severe bumps and blistering  . Neosporin [Neomycin-Bacitracin Zn-Polymyx] Rash    blistering  . Nexium [Esomeprazole Magnesium] Other (See Comments)    Headache  . Penicillins Hives and Swelling    Did it involve swelling of the face/tongue/throat, SOB, or low BP? No Did it involve sudden or severe rash/hives, skin peeling, or any reaction on the inside of your mouth or nose? No Did you need to seek medical attention at a hospital or doctor's office? No When did it last happen? If all above answers are "NO", may proceed with  cephalosporin use.       Outpatient Encounter Medications as of 11/21/2020  Medication Sig  . calcium carbonate (TUMS - DOSED IN MG ELEMENTAL CALCIUM) 500 MG chewable tablet Chew 1 tablet by mouth daily.  . dapagliflozin propanediol (FARXIGA) 10 MG TABS tablet Farxiga 10 mg tablet  Take 1 tablet every day by oral route.  Marland Kitchen levothyroxine (SYNTHROID, LEVOTHROID) 100 MCG tablet Take 100 mcg by mouth daily before breakfast.  . linaclotide (LINZESS) 290 MCG CAPS capsule Take 1 capsule (290 mcg total) by mouth daily.  . metFORMIN (GLUCOPHAGE-XR) 500 MG 24 hr tablet Take  500 mg by mouth daily.  . rosuvastatin (CRESTOR) 10 MG tablet Take 10 mg by mouth daily.  Marland Kitchen triamterene-hydrochlorothiazide (DYAZIDE) 37.5-25 MG capsule Take 1 capsule by mouth every morning.  . metoprolol succinate (TOPROL-XL) 50 MG 24 hr tablet Take 1 tablet (50 mg total) by mouth at bedtime. Take with or immediately following a meal.  . [DISCONTINUED] Dulaglutide (TRULICITY) 1.5 GY/6.9SW SOPN Inject 1 Dose into the skin once a week.   No facility-administered encounter medications on file as of 11/21/2020.     REVIEW OF SYSTEMS  : All other systems reviewed and negative except where noted in the History of Present Illness.   PHYSICAL EXAM: BP 132/72   Pulse 100   Ht 5' 6.5" (1.689 m)   Wt 284 lb (128.8 kg)   BMI 45.15 kg/m  General: Well developed AA female in no acute distress Head: Normocephalic and atraumatic Eyes:  Sclerae anicteric, conjunctiva pink. Ears: Normal auditory acuity  Lungs: Clear throughout to auscultation; no W/R/R. Heart: Regular rate and rhythm; no M/R/G. Abdomen: Soft, non-distended.  BS present.  Mild-mid-abdominal TTP. Musculoskeletal: Symmetrical with no gross deformities  Skin: No lesions on visible extremities Extremities: No edema  Neurological: Alert oriented x 4, grossly non-focal Psychological:  Alert and cooperative. Normal mood and affect  ASSESSMENT AND PLAN: *70 year old female who follows here for chronic constipation and is on Linzess 290 mcg daily.  Has history of episode of diverticulitis remotely.  Presents here today with complaints of abdominal pain.  She is reporting mid generalized abdominal pain for the past several days.  She is under a lot of stress recently as she is planning her niece's funeral that is tomorrow.  She says that she is not sleeping at night.  I do not think she has diverticulitis and I am not concerned about anything severe going on here.  I think she is likely having abdominal cramping and spasming related to her  anxiety and stress.  She has not taken her Linzess in a couple of days so I asked her to make sure she takes a dose of that so that she has a good bowel movement and that can sometimes help with abdominal pain as well.  I am going to prescribe Levsin 0.125 mg for her to take every 6 hours as needed.  I would like her to use it at least twice a day.  Prescription sent to pharmacy.  She will call us back with any worsening symptoms or ongoing issues, but hopefully things will subside for her somewhat over the next several days.   CC:  Deland Pretty, MD

## 2020-11-21 NOTE — Telephone Encounter (Signed)
Patient with a remote hx of Diverticulitis.  She is having upper abdominal pain.  "Not sure if I am just stressed out".  She had the death in the family and the funeral is tomorrow.  She will come in today at 2:00 to see Alonza Bogus, PA

## 2020-11-21 NOTE — Patient Instructions (Signed)
If you are age 71 or older, your body mass index should be between 23-30. Your Body mass index is 45.15 kg/m. If this is out of the aforementioned range listed, please consider follow up with your Primary Care Provider.  If you are age 38 or younger, your body mass index should be between 19-25. Your Body mass index is 45.15 kg/m. If this is out of the aformentioned range listed, please consider follow up with your Primary Care Provider.   We have sent the following medications to your pharmacy for you to pick up at your convenience: Levsin 0.125 mg every 6 hours as needed.  Take Linzess this evening.  Thank you for choosing me and [facility].  Alonza Bogus, PA-C

## 2020-11-22 ENCOUNTER — Ambulatory Visit: Payer: Medicare Other | Admitting: Gastroenterology

## 2020-11-26 ENCOUNTER — Ambulatory Visit: Payer: Medicare Other | Admitting: Cardiology

## 2020-11-27 DIAGNOSIS — E1165 Type 2 diabetes mellitus with hyperglycemia: Secondary | ICD-10-CM | POA: Diagnosis not present

## 2020-11-27 DIAGNOSIS — E7800 Pure hypercholesterolemia, unspecified: Secondary | ICD-10-CM | POA: Diagnosis not present

## 2020-11-27 DIAGNOSIS — I10 Essential (primary) hypertension: Secondary | ICD-10-CM | POA: Diagnosis not present

## 2020-11-27 DIAGNOSIS — E039 Hypothyroidism, unspecified: Secondary | ICD-10-CM | POA: Diagnosis not present

## 2020-12-03 ENCOUNTER — Other Ambulatory Visit: Payer: Self-pay | Admitting: Cardiology

## 2020-12-16 ENCOUNTER — Encounter: Payer: Self-pay | Admitting: Cardiology

## 2020-12-16 ENCOUNTER — Ambulatory Visit (INDEPENDENT_AMBULATORY_CARE_PROVIDER_SITE_OTHER): Payer: Medicare Other | Admitting: Cardiology

## 2020-12-16 ENCOUNTER — Other Ambulatory Visit: Payer: Self-pay

## 2020-12-16 VITALS — BP 130/80 | HR 111 | Ht 66.0 in | Wt 275.0 lb

## 2020-12-16 DIAGNOSIS — E1165 Type 2 diabetes mellitus with hyperglycemia: Secondary | ICD-10-CM | POA: Diagnosis not present

## 2020-12-16 DIAGNOSIS — R000 Tachycardia, unspecified: Secondary | ICD-10-CM | POA: Diagnosis not present

## 2020-12-16 DIAGNOSIS — R600 Localized edema: Secondary | ICD-10-CM | POA: Diagnosis not present

## 2020-12-16 DIAGNOSIS — E8881 Metabolic syndrome: Secondary | ICD-10-CM | POA: Diagnosis not present

## 2020-12-16 DIAGNOSIS — I10 Essential (primary) hypertension: Secondary | ICD-10-CM | POA: Diagnosis not present

## 2020-12-16 NOTE — Progress Notes (Signed)
Primary Care Provider: Deland Pretty, MD Cardiologist: No primary care provider on file. Electrophysiologist: None  Clinic Note: Chief Complaint  Patient presents with  . Follow-up    1 year.  . Tachycardia    Quite often driven by stress and anxiety.  Usually controlled with beta-blocker.  . Weight Loss    She has changed her diet and increase exercise-roughly 30 pound weight loss.   ===================================  ASSESSMENT/PLAN   Problem List Items Addressed This Visit    Diabetes mellitus type 2, uncontrolled (HCC) (Chronic)    Last A1c that I saw was 9.6.  She is on [place] and Ozempic.  Both provide cardiovascular benefit.  Would anticipate that Ozempic may be playing a role in weight loss as well.      Relevant Medications   Semaglutide (OZEMPIC, 1 MG/DOSE, [place])   Metabolic syndrome - Primary (Chronic)    Morbid obesity, diabetes and hypertension as well as triglycerides.  Continue statin along with Farxiga and Ozempic -have not seen the most recent A1c.Marland Kitchen  Hopefully this will help with weight loss as well.  She has relatively controlled blood pressures on beta-blocker and diuretic.  Low threshold to consider ARB.  Defer management of diabetes to PCP.  Blood pressure and lipids appear to be controlled.  Continue to discussed dietary modification and weight loss.      Bilateral edema of lower extremity (Chronic)    Pretty well controlled with Dyazide.  She keeps her feet up during the day whenever possible.  In addition to Dyazide she is also on Farxiga which helps with diuresis as well.      Essential hypertension (Chronic)    Blood pressure looks pretty good today.  She is on Dyazide and Toprol.  Continue the plan with PRN additional dose of Toprol (one half or full tab) for worsening tachycardia or higher blood pressure.  Continue to monitor, next step would be to consider ARB.      Sinus tachycardia by electrocardiogram (Chronic)    Somewhat driven by  anxiety.  She says for the most part her heart rate is not that bad.  He remains elevated probably would consider increasing Toprol to 100 mg.  The plan for now will be for her to take it to as needed dosing of additional Toprol for heart rates that are increased.  If she uses this quite a bit, would then have the impetus to increase standing dose.      Relevant Orders   EKG 12-Lead (Completed)     ===================================  HPI:    Sandy Daniels is a morbidly obese 70 y.o. female with a PMH notable for METABOLIC SYNDROME, SINUS TACHYCARDIA, and BILATERAL LE EDEMA who presents today for annual follow-up.  Sandy Daniels was last seen on November 24, 2019-no significant cardiac symptoms.  Some nagging off-and-on chest discomfort associated mostly at rest.  Multiple questions.  Very anxious.  Lots of stress at home.  She thinks it may be her anxiety and stress as well as driving her tachycardia.  She remains morbidly obese.  Stable from prior standpoint.  Recent Hospitalizations: None  Reviewed  CV studies:    The following studies were reviewed today: (if available, images/films reviewed: From Epic Chart or Care Everywhere) . None:   Interval History:   Sandy Daniels returns for annual follow-up doing fine from a cardiac standpoint.  She said that her heart rate goes up and down through the course of the day, a lot  of is dependent how stressed she is.  She is much more "on the go "than she was before.  She has now taken on the role of caregiver for not only her 35 year old aunt but also her cousins 23 year old granddaughter after her Daniels passed away leaving her the only family member for both the 71 year old and 71 year old.  Not unexpectedly, the aunt is quite needy and difficult to care for.  Hard to please.  She does the best she can to escape her aunts house to go back home to her own house to sleep at night, but is having to do the upkeep her 2 houses and also caring for the  80 year old grandniece.  This has been quite a change for her and it has made it difficult for her to continue with her charity events.  Despite all this, she has done an excellent job with dietary adjustments having lost almost 30 pounds.  She says that since she is lost the weight, her heart rate is less labile.  She denies any chest pain or pressure.  Limited more by musculoskeletal pains and any dyspnea.  CV Review of Symptoms (Summary): no chest pain or dyspnea on exertion positive for - palpitations, rapid heart rate and Relatively stable. negative for - edema, irregular heartbeat, orthopnea, paroxysmal nocturnal dyspnea, shortness of breath or Lightheadedness, dizziness, wooziness, syncope/near syncope or TIA/amaurosis fugax, claudication  Symptoms (Summary): no chest pain or dyspnea on exertion positive for - rapid heart rate and However not very symptomatic.  Some positional dizziness.   negative for - edema, irregular heartbeat, orthopnea, paroxysmal nocturnal dyspnea, shortness of breath or Syncope/near syncope, TIA/amaurosis fugax, claudication   The patient does not have symptoms concerning for COVID-19 infection (fever, chills, cough, or new shortness of breath).   REVIEWED OF SYSTEMS   Review of Systems  Constitutional: Positive for weight loss (Change her diet, is also much more active now.).  HENT: Negative for congestion.   Respiratory: Negative for shortness of breath.   Genitourinary: Positive for urgency (Frequent nocturia).  Musculoskeletal: Positive for joint pain. Negative for neck pain.  Neurological: Positive for dizziness (Off-and-on dizziness). Negative for tingling, focal weakness and weakness.  Psychiatric/Behavioral: Negative for depression (Sad, but not depressed) and memory loss. The patient is nervous/anxious. The patient does not have insomnia.        Lots of social stress.  Having to be the primary caregiver for her 26 year old aunt as well as her aunt's  great-granddaughter (56 year old now orphaned granddaughter of Sandy Daniels -> both mother and grandmother recently died)    I have reviewed and (if needed) personally updated the patient's problem list, medications, allergies, past medical and surgical history, social and family history.   PAST MEDICAL HISTORY   Past Medical History:  Diagnosis Date  . Anxiety   . Diabetes mellitus without complication ([place])   . Diverticulitis large intestine 01/2013  . GERD (gastroesophageal reflux disease)   . Goiter   . History of Helicobacter pylori infection 05/25/2003   gastritis at EGD  . Hyperlipidemia   . Hypertension   . Hypothyroidism    On thyroid supplementation  . Morbid obesity with BMI of 45.0-49.9, adult ([place])   . OA (osteoarthritis)   . Personal history of colonic adenomas 06/12/2013  . Seborrheic dermatitis   . Tendonitis of ankle   . Vitamin D deficiency     PAST SURGICAL HISTORY   Past Surgical History:  Procedure Laterality Date  . COLONOSCOPY  03/15/2002, 01/22/2017  internal hemorrhoids, Dr. Jim Desanctis  . ESOPHAGOGASTRODUODENOSCOPY  05/25/2003   H. Pylori, Dr. Jim Desanctis  . NM MYOVIEW LTD  10/08/2011   LOW RISK study. EF 68%. Breast attenuation artifact noted otherwise normal study.  Marland Kitchen POLYPECTOMY    . TONSILLECTOMY    . TRANSTHORACIC ECHOCARDIOGRAM  10/08/2011   Normal study. No valve lesions. Normal LV function, EF >55%, GR 1 DD. No valve lesions  . UPPER GASTROINTESTINAL ENDOSCOPY    . VAGINAL HYSTERECTOMY      Immunization History  Administered Date(s) Administered  . Influenza,inj,Quad PF,6+ Mos 08/05/2017  . Influenza-Unspecified 08/29/2012  . Moderna Sars-Covid-2 Vaccination 11/26/2019, 12/24/2019  . Tdap 11/25/2010  . Zoster 12/21/2012    MEDICATIONS/ALLERGIES   Current Meds  Medication Sig  . calcium carbonate (TUMS - DOSED IN MG ELEMENTAL CALCIUM) 500 MG chewable tablet Chew 1 tablet by mouth daily.  . dapagliflozin propanediol (FARXIGA) 10  MG TABS tablet Farxiga 10 mg tablet  Take 1 tablet every day by oral route.  Marland Kitchen levothyroxine (SYNTHROID, LEVOTHROID) 100 MCG tablet Take 100 mcg by mouth daily before breakfast.  . linaclotide (LINZESS) 290 MCG CAPS capsule Take 1 capsule (290 mcg total) by mouth daily.  . metoprolol succinate (TOPROL-XL) 50 MG 24 hr tablet TAKE 1 TABLET (50 MG TOTAL) BY MOUTH AT BEDTIME. TAKE WITH OR IMMEDIATELY FOLLOWING A MEAL.  . rosuvastatin (CRESTOR) 10 MG tablet Take 10 mg by mouth daily.  . Semaglutide (OZEMPIC, 1 MG/DOSE, [place]) Inject 1 mg into the skin daily.  Marland Kitchen triamterene-hydrochlorothiazide (DYAZIDE) 37.5-25 MG capsule Take 1 capsule by mouth every morning.  . [DISCONTINUED] hyoscyamine (LEVSIN) 0.125 MG tablet Take 1 tablet (0.125 mg total) by mouth every 6 (six) hours as needed.  . [DISCONTINUED] metFORMIN (GLUCOPHAGE-XR) 500 MG 24 hr tablet Take 500 mg by mouth daily.    Allergies  Allergen Reactions  . Betadine [Povidone Iodine] Rash    Severe bumps and blistering  . Neosporin [Neomycin-Bacitracin Zn-Polymyx] Rash    blistering  . Nexium [Esomeprazole Magnesium] Other (See Comments)    Headache  . Penicillins Hives and Swelling    Did it involve swelling of the face/tongue/throat, SOB, or low BP? No Did it involve sudden or severe rash/hives, skin peeling, or any reaction on the inside of your mouth or nose? No Did you need to seek medical attention at a hospital or doctor's office? No When did it last happen? If all above answers are "NO", may proceed with cephalosporin use.     SOCIAL HISTORY/FAMILY HISTORY   Reviewed in Epic:  Pertinent findings:  Social History   Tobacco Use  . Smoking status: Former Smoker    Types: Cigarettes    Quit date: 01/03/2013    Years since quitting: 8.0  . Smokeless tobacco: Never Used  Vaping Use  . Vaping Use: Never used  Substance Use Topics  . Alcohol use: No  . Drug use: No   Social History   Social History Narrative   [employer]   Divorced   1 daughter   She does significant out of volunteer work with 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   --> She has been quite distressed with the fact that she is not able to do her routine fundraising due to COVID-19 restrictions.      She is now the caregiver for her 55 year old aunt and her Daniels's 67 year old granddaughter.  Essentially, both her Daniels and niece (the  mother of the 16 year old) have passed away over the last year plus.  Her Daniels had been caring for both until she died.  This is left Sandy Daniels as the only remaining family for the 2 ladies-the 71 year old and 71 year old.   Caring for the aunt is very stressful.  She has to go up and down lots of steps is doing much more housework, basically doing to the hospital for 2 houses.  She is also trying to somehow figure out how to be the caregiver of a 71 year old again-having a hard time dealing with the changes of the new generation of children.      She has made significant dietary adjustments and is increasing her exercise.Alycia Patten -PE, EKG, labs   Wt Readings from Last 3 Encounters:  12/16/20 275 lb (124.7 kg)  11/21/20 284 lb (128.8 kg)  01/26/20 (!) 304 lb (137.9 kg)    Physical Exam: BP 130/80   Pulse (!) 111   Ht 5\' 6"  (1.676 m)   Wt 275 lb (124.7 kg)   BMI 44.39 kg/m  Physical Exam Vitals reviewed.  Constitutional:      General: She is not in acute distress.    Appearance: She is not ill-appearing or toxic-appearing.     Comments: Morbidly obese, well-groomed.  HENT:     Head: Atraumatic.  Neck:     Vascular: No carotid bruit, hepatojugular reflux (Difficult to assess ) or JVD (Difficult to assess).  Cardiovascular:     Rate and Rhythm: Regular rhythm. Tachycardia present.  No extrasystoles are present.    Chest Wall: PMI is not displaced (Unable to assess).     Pulses: Intact distal pulses. No decreased pulses  (Difficult to palpate, but present).     Heart sounds: S1 normal and S2 normal. Heart sounds are distant. No murmur heard. No friction rub. No gallop.   Pulmonary:     Effort: Pulmonary effort is normal. No respiratory distress.     Breath sounds: Normal breath sounds.  Chest:     Chest wall: No tenderness.  Musculoskeletal:        General: Swelling (Trivial) present. Normal range of motion.     Cervical back: Normal range of motion and neck supple.  Skin:    General: Skin is warm and dry.  Neurological:     General: No focal deficit present.     Mental Status: She is alert and oriented to person, place, and time.     Gait: Gait abnormal (Slow).  Psychiatric:        Mood and Affect: Mood normal.        Behavior: Behavior normal.        Thought Content: Thought content normal.        Judgment: Judgment normal.     Comments: Just seems very stressed     Adult ECG Report  Rate: 111 ;  Rhythm: sinus tachycardia; low voltage.  Otherwise normal axis, intervals and durations.  Narrative Interpretation: Stable EKG  Recent Labs:    11/27/2020: TC 124, TG 196, HDL 43, LDL 49.  TSH 1.5. No results found for: CHOL, HDL, LDLCALC, LDLDIRECT, TRIG, CHOLHDL Lab Results  Component Value Date   CREATININE 0.94 04/30/2019   BUN 8 04/30/2019   NA 130 (L) 04/30/2019   K 3.5 04/30/2019   CL 96 (L) 04/30/2019   CO2 21 (L) 04/30/2019   CBC Latest Ref Rng & Units 04/30/2019 05/20/2016 05/20/2016  WBC 4.0 - 10.5 K/uL 16.0(H) -  12.0(H)  Hemoglobin 12.0 - 15.0 g/dL 14.8 16.7(H) 15.4(H)  Hematocrit 36.0 - 46.0 % 46.0 49.0(H) 46.2(H)  Platelets 150 - 400 K/uL 183 - 250    Lab Results  Component Value Date   TSH 0.472 04/07/2015    ==================================================  COVID-19 Education: The signs and symptoms of COVID-19 were discussed with the patient and how to seek care for testing (follow up with PCP or arrange E-visit).   The importance of social distancing and COVID-19  vaccination was discussed today. The patient is practicing social distancing & Masking.   I spent a total of 45minutes with the patient spent in direct patient consultation.  Additional time spent with chart review  / charting (studies, outside notes, etc): 12 min Total Time: 31 min  Current medicines are reviewed at length with the patient today.  (+/- concerns) n/a  This visit occurred during the SARS-CoV-2 public health emergency.  Safety protocols were in place, including screening questions prior to the visit, additional usage of staff PPE, and extensive cleaning of exam room while observing appropriate contact time as indicated for disinfecting solutions.  Notice: This dictation was prepared with Dragon dictation along with smaller phrase technology. Any transcriptional errors that result from this process are unintentional and may not be corrected upon review.  Patient Instructions / Medication Changes & Studies & Tests Ordered   Patient Instructions  Medication Instructions:   Take an extra metoprolol if needed *If you need a refill on your cardiac medications before your next appointment, please call your pharmacy*   Lab Work: Not needed    Testing/Procedures: Not needed    Follow-Up: At [facility], you and your health needs are our priority.  As part of our continuing mission to provide you with exceptional heart care, we have created designated Provider Care Teams.  These Care Teams include your primary Cardiologist (physician) and Advanced Practice Providers (APPs -  Physician Assistants and Nurse Practitioners) who all work together to provide you with the care you need, when you need it.     Your next appointment:   12 month(s)  The format for your next appointment:   In Person  Provider:   Glenetta Hew, MD   Other Instructions    Studies Ordered:   Orders Placed This Encounter  Procedures  . EKG 12-Lead     Glenetta Hew, M.D.,  M.S. Interventional Cardiologist   Pager # [phone] Phone # [phone] [address] [place], [place] [address]   Thank you for choosing Heartcare at [facility]!!

## 2020-12-16 NOTE — Patient Instructions (Addendum)
Medication Instructions:   Take an extra metoprolol if needed *If you need a refill on your cardiac medications before your next appointment, please call your pharmacy*   Lab Work: Not needed    Testing/Procedures: Not needed    Follow-Up: At [facility], you and your health needs are our priority.  As part of our continuing mission to provide you with exceptional heart care, we have created designated Provider Care Teams.  These Care Teams include your primary Cardiologist (physician) and Advanced Practice Providers (APPs -  Physician Assistants and Nurse Practitioners) who all work together to provide you with the care you need, when you need it.     Your next appointment:   12 month(s)  The format for your next appointment:   In Person  Provider:   Glenetta Hew, MD   Other Instructions

## 2020-12-23 DIAGNOSIS — E039 Hypothyroidism, unspecified: Secondary | ICD-10-CM | POA: Diagnosis not present

## 2020-12-23 DIAGNOSIS — I10 Essential (primary) hypertension: Secondary | ICD-10-CM | POA: Diagnosis not present

## 2020-12-23 DIAGNOSIS — E1165 Type 2 diabetes mellitus with hyperglycemia: Secondary | ICD-10-CM | POA: Diagnosis not present

## 2020-12-23 DIAGNOSIS — E7800 Pure hypercholesterolemia, unspecified: Secondary | ICD-10-CM | POA: Diagnosis not present

## 2021-01-11 ENCOUNTER — Encounter: Payer: Self-pay | Admitting: Cardiology

## 2021-01-11 NOTE — Assessment & Plan Note (Signed)
Morbid obesity, diabetes and hypertension as well as triglycerides.  Continue statin along with Farxiga and Ozempic -have not seen the most recent A1c.Marland Kitchen  Hopefully this will help with weight loss as well.  She has relatively controlled blood pressures on beta-blocker and diuretic.  Low threshold to consider ARB.  Defer management of diabetes to PCP.  Blood pressure and lipids appear to be controlled.  Continue to discussed dietary modification and weight loss.

## 2021-01-11 NOTE — Assessment & Plan Note (Signed)
Pretty well controlled with Dyazide.  She keeps her feet up during the day whenever possible.  In addition to Dyazide she is also on Farxiga which helps with diuresis as well.

## 2021-01-11 NOTE — Assessment & Plan Note (Signed)
Last A1c that I saw was 9.6.  She is on [place] and Ozempic.  Both provide cardiovascular benefit.  Would anticipate that Ozempic may be playing a role in weight loss as well.

## 2021-01-11 NOTE — Assessment & Plan Note (Signed)
Blood pressure looks pretty good today.  She is on Dyazide and Toprol.  Continue the plan with PRN additional dose of Toprol (one half or full tab) for worsening tachycardia or higher blood pressure.  Continue to monitor, next step would be to consider ARB.

## 2021-01-11 NOTE — Assessment & Plan Note (Signed)
Somewhat driven by anxiety.  She says for the most part her heart rate is not that bad.  He remains elevated probably would consider increasing Toprol to 100 mg.  The plan for now will be for her to take it to as needed dosing of additional Toprol for heart rates that are increased.  If she uses this quite a bit, would then have the impetus to increase standing dose.

## 2021-02-03 DIAGNOSIS — E1165 Type 2 diabetes mellitus with hyperglycemia: Secondary | ICD-10-CM | POA: Diagnosis not present

## 2021-02-03 DIAGNOSIS — E039 Hypothyroidism, unspecified: Secondary | ICD-10-CM | POA: Diagnosis not present

## 2021-02-03 DIAGNOSIS — I10 Essential (primary) hypertension: Secondary | ICD-10-CM | POA: Diagnosis not present

## 2021-02-03 DIAGNOSIS — E7800 Pure hypercholesterolemia, unspecified: Secondary | ICD-10-CM | POA: Diagnosis not present

## 2021-02-03 DIAGNOSIS — K219 Gastro-esophageal reflux disease without esophagitis: Secondary | ICD-10-CM | POA: Diagnosis not present

## 2021-02-18 DIAGNOSIS — I10 Essential (primary) hypertension: Secondary | ICD-10-CM | POA: Diagnosis not present

## 2021-02-18 DIAGNOSIS — E039 Hypothyroidism, unspecified: Secondary | ICD-10-CM | POA: Diagnosis not present

## 2021-02-18 DIAGNOSIS — E1165 Type 2 diabetes mellitus with hyperglycemia: Secondary | ICD-10-CM | POA: Diagnosis not present

## 2021-02-18 DIAGNOSIS — E7800 Pure hypercholesterolemia, unspecified: Secondary | ICD-10-CM | POA: Diagnosis not present

## 2021-03-19 DIAGNOSIS — Z23 Encounter for immunization: Secondary | ICD-10-CM | POA: Diagnosis not present

## 2021-03-25 DIAGNOSIS — H60331 Swimmer's ear, right ear: Secondary | ICD-10-CM | POA: Diagnosis not present

## 2021-04-22 IMAGING — US ULTRASOUND ABDOMEN LIMITED
1 series · 14 of 25 positions shown · non-contrast
Comparison: CT from the same day

CLINICAL DATA: Pain

EXAM:
ULTRASOUND ABDOMEN LIMITED RIGHT UPPER QUADRANT

[Series 1: ultrasound abdomen limited · 14 of 58 slices shown]
[im 1/58]
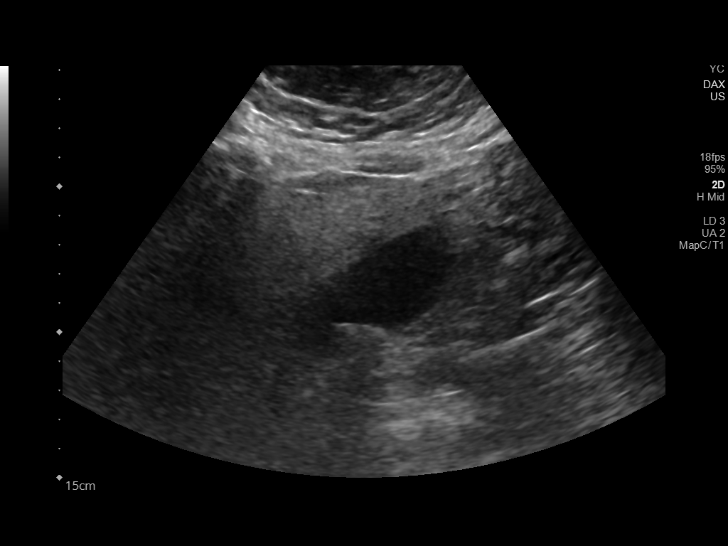
[im 5/58]
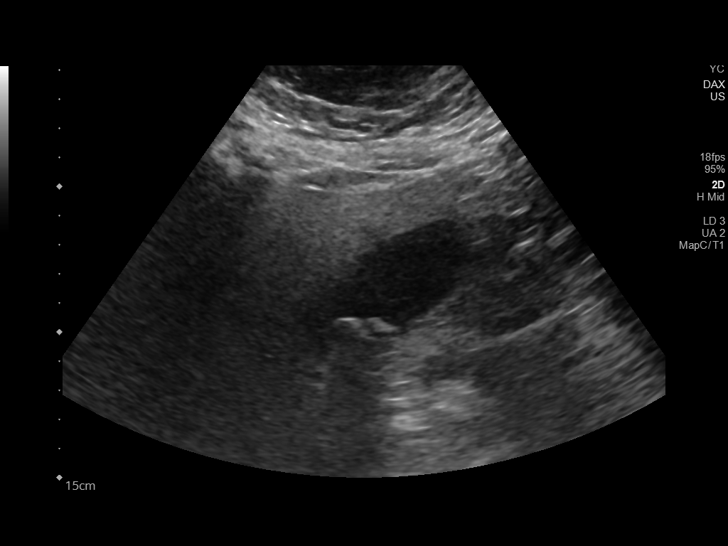
[im 10/58]
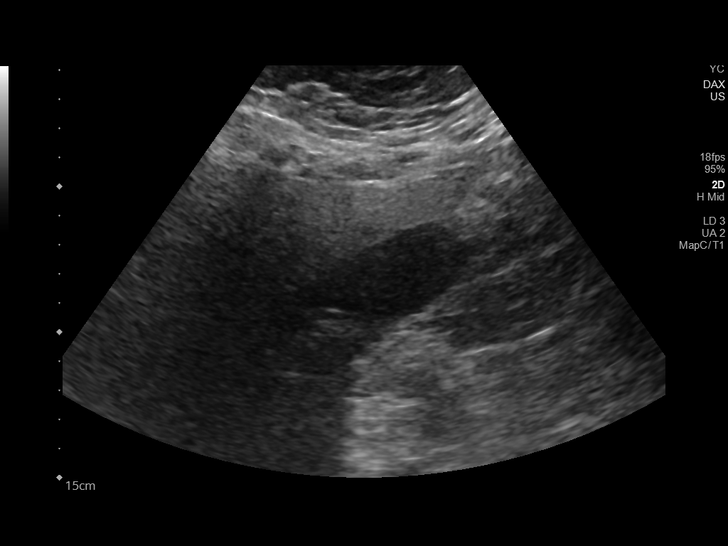
[im 15/58]
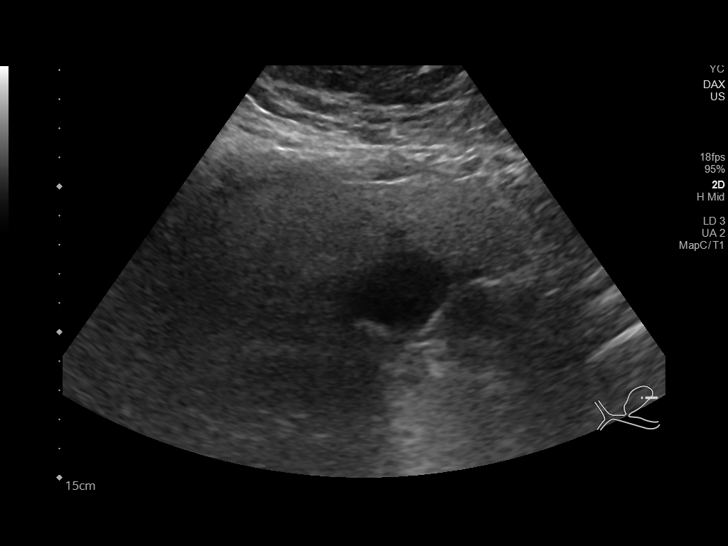
[im 20/58]
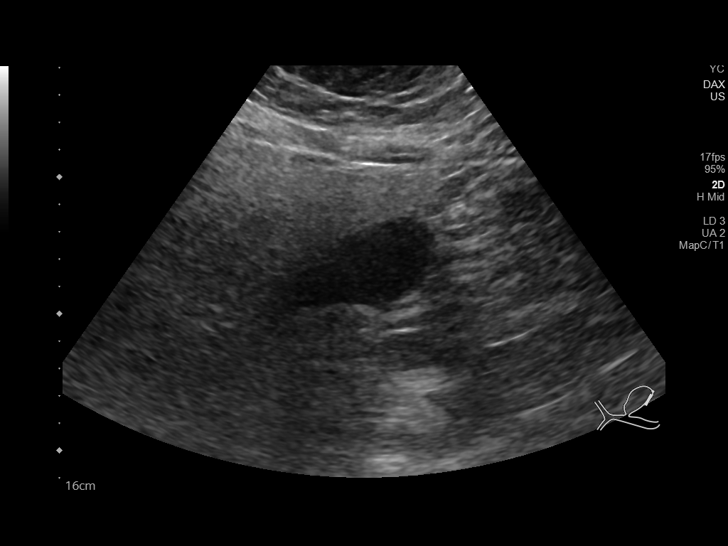
[im 22/58]
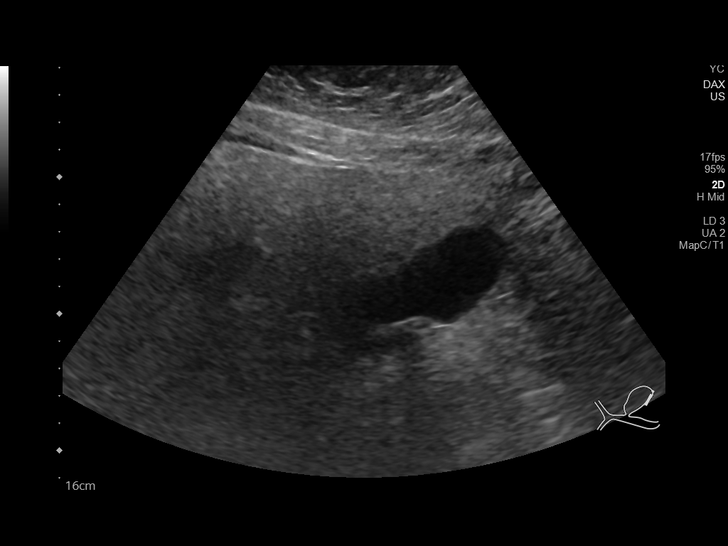
[im 27/58]
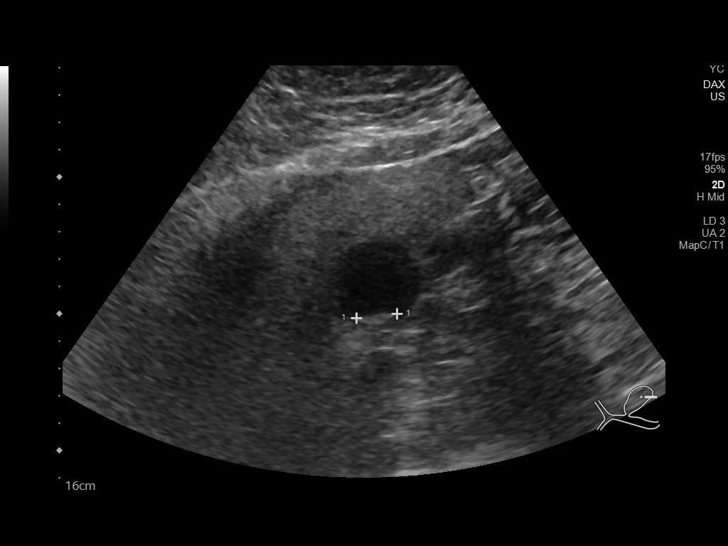
[im 31/58]
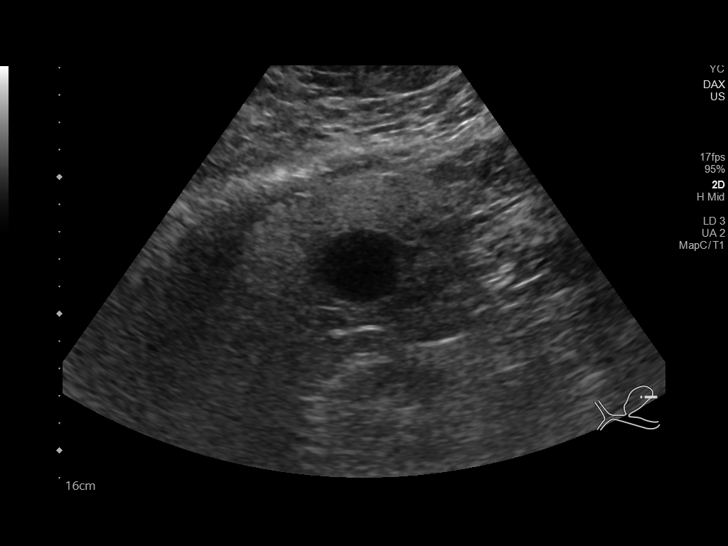
[im 36/58]
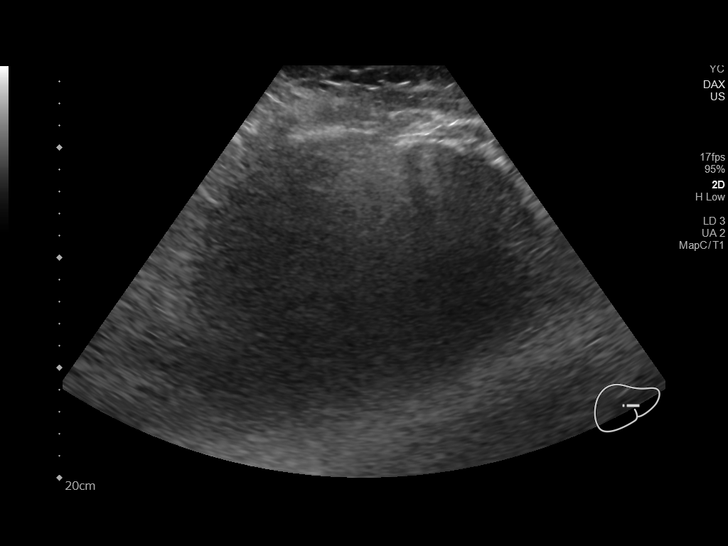
[im 39/58]
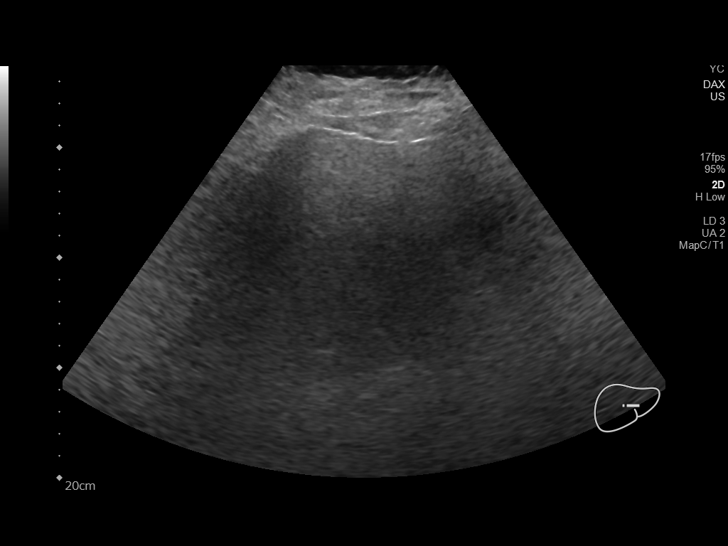
[im 43/58]
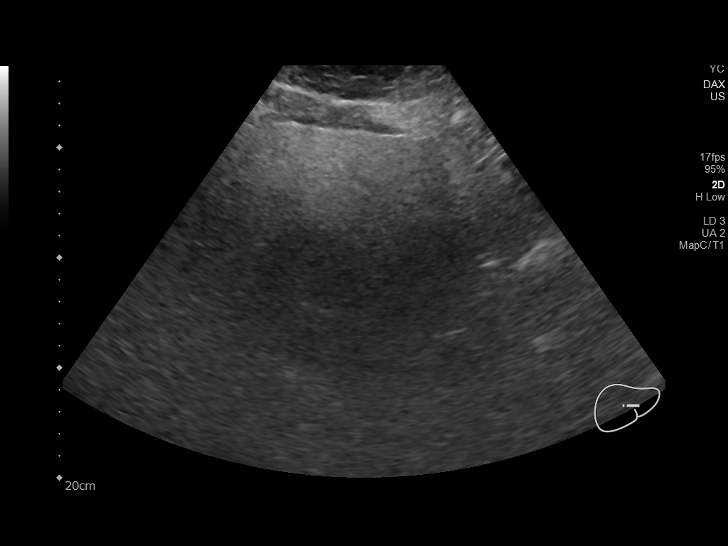
[im 48/58]
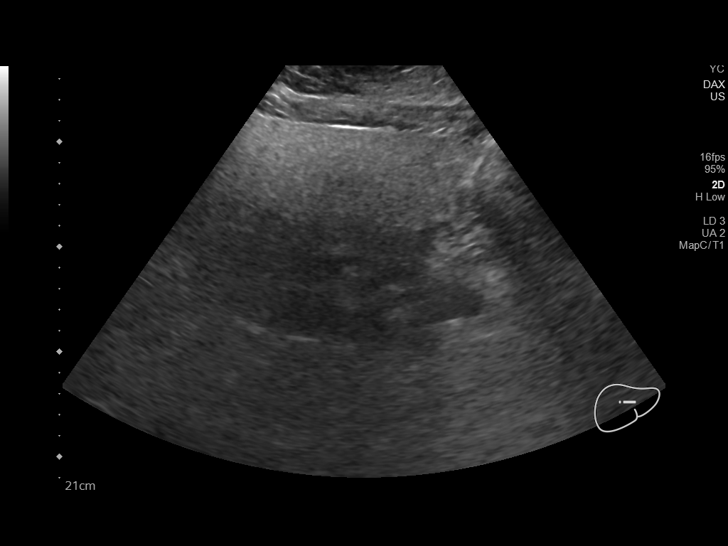
[im 53/58]
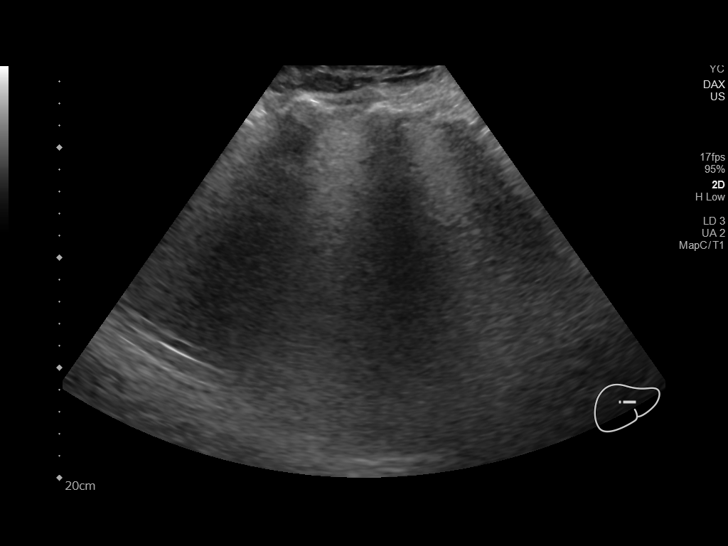
[im 58/58]
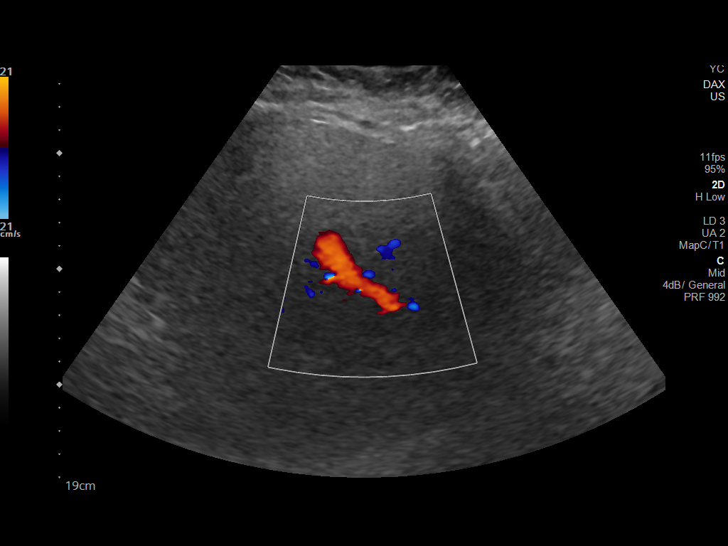

[14 of 25 positions shown; findings below may reference images not displayed]

FINDINGS: Gallbladder:

There is cholelithiasis without secondary signs of acute
cholecystitis. There is no gallbladder wall thickening or
pericholecystic free fluid. The sonographic Murphy sign is reported
as negative.

Common bile duct:

Diameter: 0.5 cm

Liver:

Diffuse increased echogenicity with slightly heterogeneous liver.
Appearance typically secondary to fatty infiltration. Fibrosis
secondary consideration. No secondary findings of cirrhosis noted.
No focal hepatic lesion or intrahepatic biliary duct dilatation.
Portal vein is patent on color Doppler imaging with normal direction
of blood flow towards the liver.
IMPRESSION: 1. There is cholelithiasis without secondary signs of acute
cholecystitis.
2. Diffuse increased echogenicity with slightly heterogeneous liver.
Appearance typically secondary to fatty infiltration. Fibrosis
secondary consideration. No secondary findings of cirrhosis noted.
No focal hepatic lesion or intrahepatic biliary duct dilatation.

## 2021-04-22 IMAGING — CT CT ABDOMEN AND PELVIS WITH CONTRAST
2 of 5 series · 16 of 46 positions shown, 18 images · IV contrast (ISOVUE)
Comparison: 02/25/2013 CT abdomen and pelvis.

CLINICAL DATA: 68 y/o F; upper abdominal pain, shortness of breath,
fever.

EXAM:
CT ABDOMEN AND PELVIS WITH CONTRAST
TECHNIQUE: Multidetector CT imaging of the abdomen and pelvis was performed
using the standard protocol following bolus administration of
intravenous contrast.
CONTRAST:  100mL OMNIPAQUE IOHEXOL 300 MG/ML  SOLN

[Series 2: axial st · axial · 0.98mm/px · z∈[-466,-76]mm · 13 of 91 slices shown, 15 images]
[im 7/91  soft-tissue]
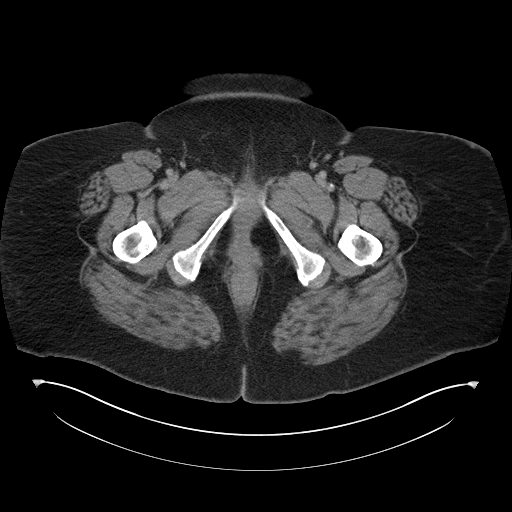
[im 7/91  bone]
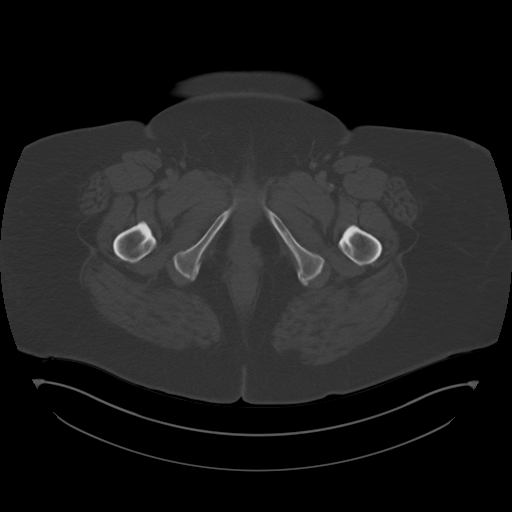
[im 13/91  soft-tissue]
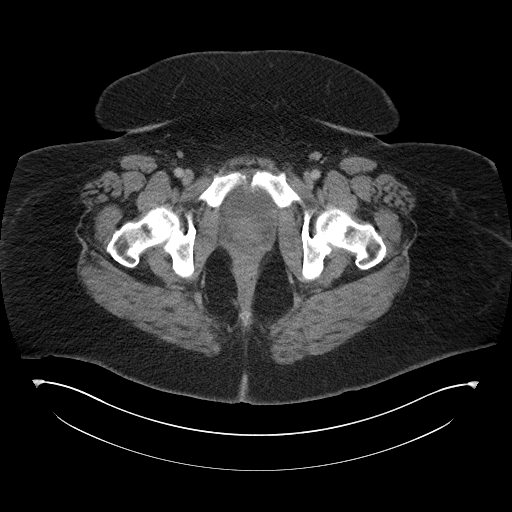
[im 19/91  soft-tissue]
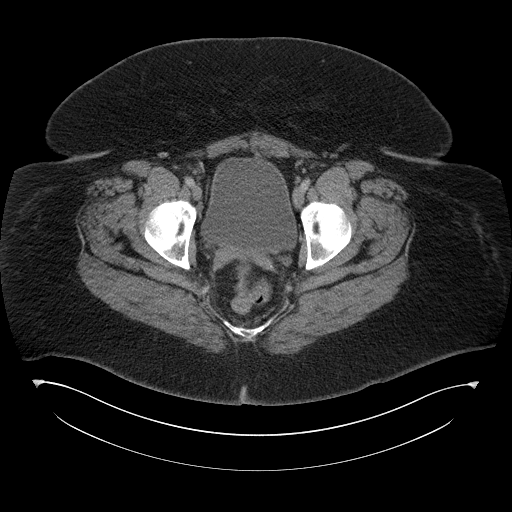
[im 25/91  soft-tissue]
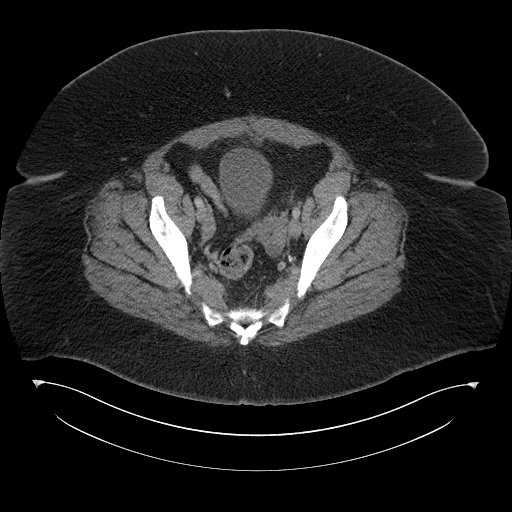
[im 31/91  soft-tissue]
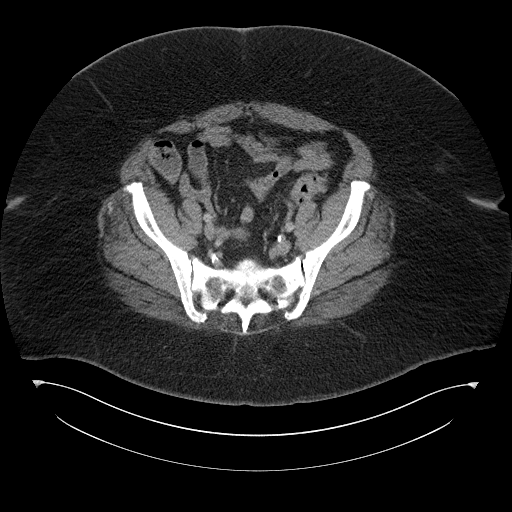
[im 37/91  soft-tissue]
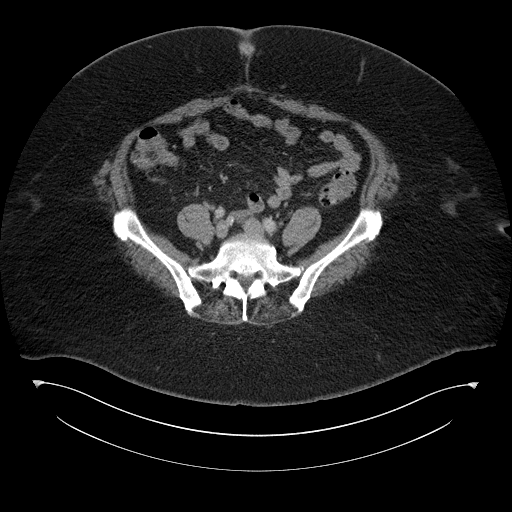
[im 49/91  soft-tissue]
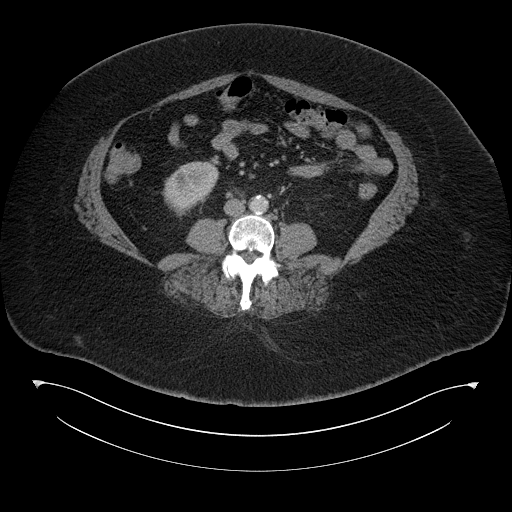
[im 55/91  soft-tissue]
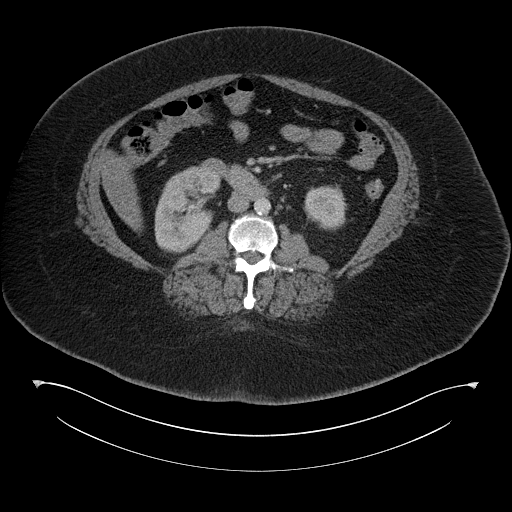
[im 61/91  soft-tissue]
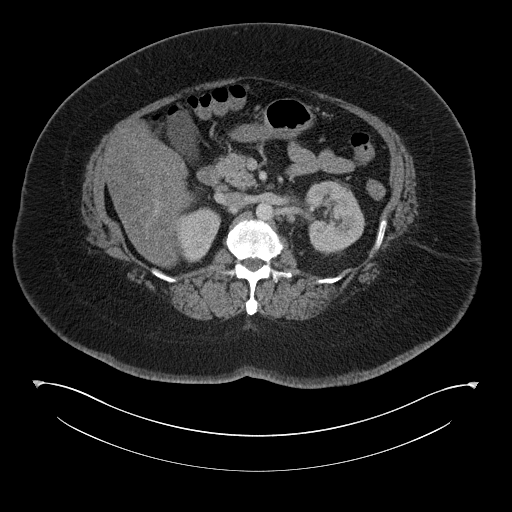
[im 61/91  bone]
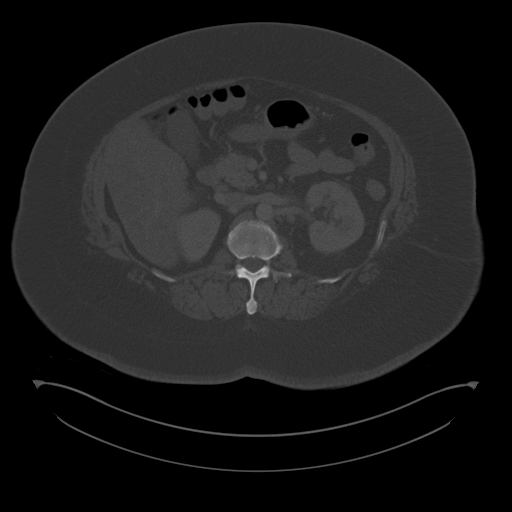
[im 67/91  soft-tissue]
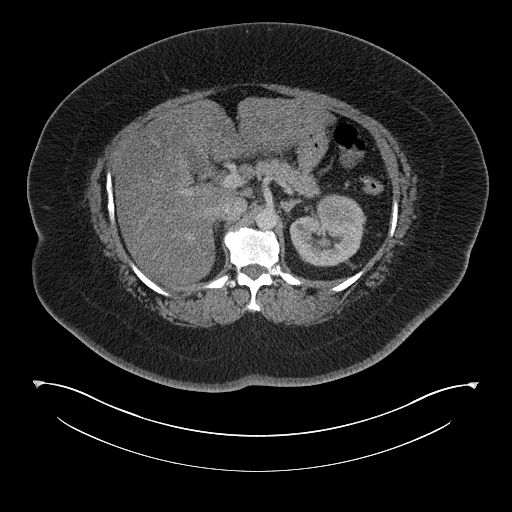
[im 73/91  soft-tissue]
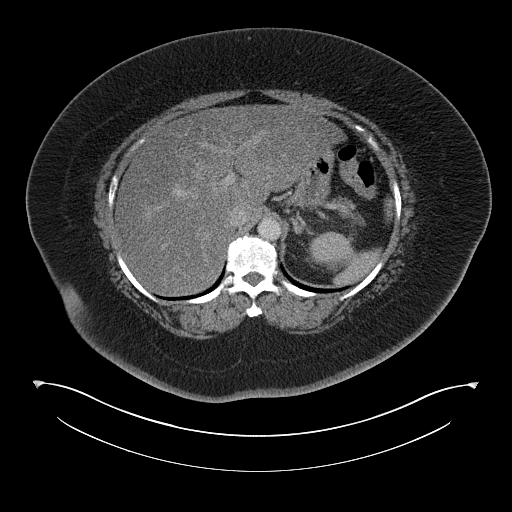
[im 79/91  soft-tissue]
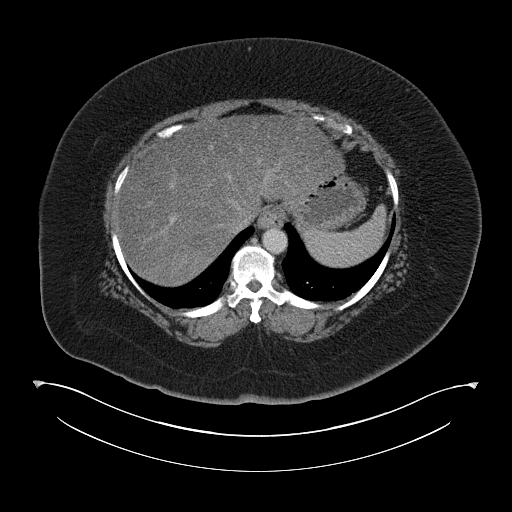
[im 85/91  soft-tissue]
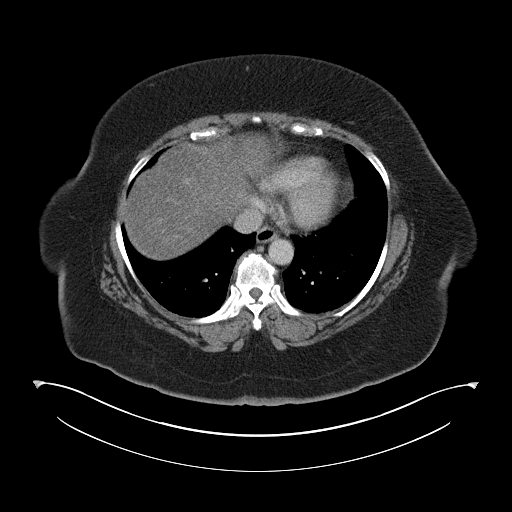

[Series 5: coronal st · coronal · 0.85mm/px · 3 of 182 slices shown]
[im 61/182  soft-tissue]
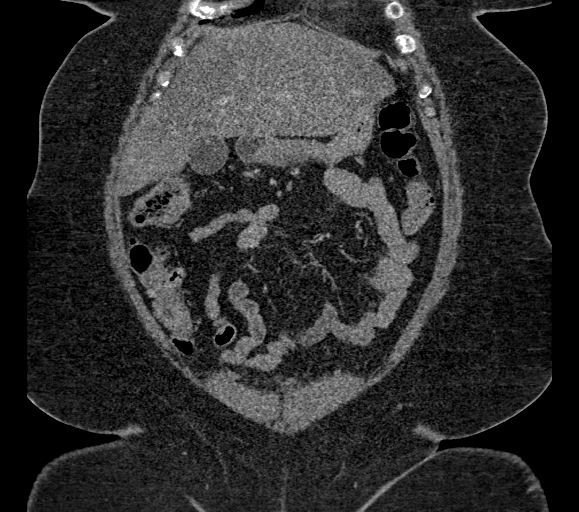
[im 81/182  soft-tissue]
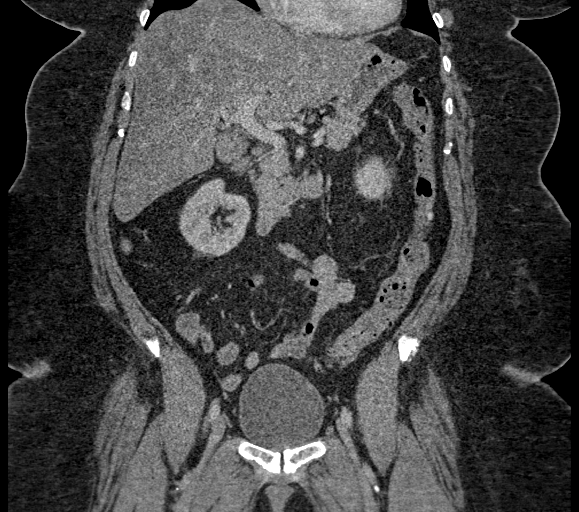
[im 101/182  soft-tissue]
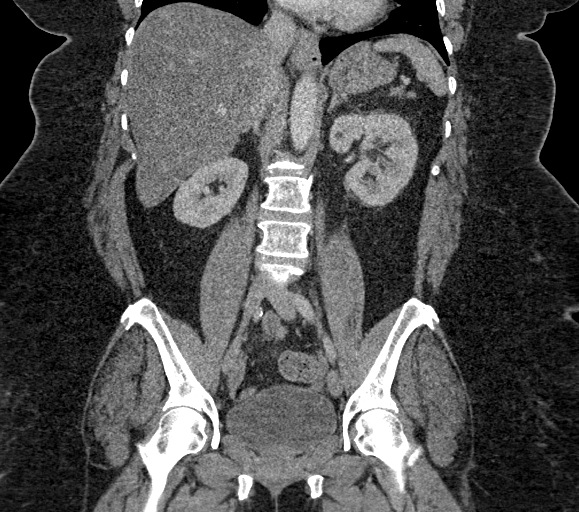

[16 of 46 positions shown; findings below may reference images not displayed]

FINDINGS: Lower chest: No acute abnormality.

Hepatobiliary: Hepatic steatosis. No focal liver abnormality is
seen. Cholelithiasis. No pericholecystic fluid, gallbladder wall
thickening, or biliary dilatation.

Pancreas: Unremarkable. No pancreatic ductal dilatation or
surrounding inflammatory changes.

Spleen: Normal in size without focal abnormality.

Adrenals/Urinary Tract: Adrenal glands are unremarkable. Small cyst
in lower pole of right kidney. Otherwise kidneys are normal, without
renal calculi, focal lesion, or hydronephrosis. Bladder is
unremarkable.

Stomach/Bowel: Stomach is within normal limits. Appendix not
identified, no pericecal inflammation. No evidence of bowel wall
thickening, distention, or inflammatory changes. Mild sigmoid
diverticulosis.

Vascular/Lymphatic: Aortic atherosclerosis. No enlarged abdominal or
pelvic lymph nodes.

Reproductive: Status post hysterectomy. No adnexal masses.

Other: No abdominal wall hernia or abnormality. No abdominopelvic
ascites.

Musculoskeletal: No acute fracture. Lumbar spondylosis with
prominent facet arthropathy.
IMPRESSION: 1. No acute process identified.
2. Hepatic steatosis.
3. Cholelithiasis.
4. Mild sigmoid diverticulosis.
5. Aortic Atherosclerosis ([id number]).

## 2021-08-06 DIAGNOSIS — E7800 Pure hypercholesterolemia, unspecified: Secondary | ICD-10-CM | POA: Diagnosis not present

## 2021-08-06 DIAGNOSIS — E119 Type 2 diabetes mellitus without complications: Secondary | ICD-10-CM | POA: Diagnosis not present

## 2021-08-06 DIAGNOSIS — Z0000 Encounter for general adult medical examination without abnormal findings: Secondary | ICD-10-CM | POA: Diagnosis not present

## 2021-08-06 DIAGNOSIS — E559 Vitamin D deficiency, unspecified: Secondary | ICD-10-CM | POA: Diagnosis not present

## 2021-08-13 DIAGNOSIS — Z0000 Encounter for general adult medical examination without abnormal findings: Secondary | ICD-10-CM | POA: Diagnosis not present

## 2021-08-13 DIAGNOSIS — Z23 Encounter for immunization: Secondary | ICD-10-CM | POA: Diagnosis not present

## 2021-08-13 DIAGNOSIS — E1165 Type 2 diabetes mellitus with hyperglycemia: Secondary | ICD-10-CM | POA: Diagnosis not present

## 2021-08-13 DIAGNOSIS — E039 Hypothyroidism, unspecified: Secondary | ICD-10-CM | POA: Diagnosis not present

## 2021-08-13 DIAGNOSIS — E7800 Pure hypercholesterolemia, unspecified: Secondary | ICD-10-CM | POA: Diagnosis not present

## 2021-08-13 DIAGNOSIS — E559 Vitamin D deficiency, unspecified: Secondary | ICD-10-CM | POA: Diagnosis not present

## 2021-08-13 DIAGNOSIS — I10 Essential (primary) hypertension: Secondary | ICD-10-CM | POA: Diagnosis not present

## 2021-08-13 DIAGNOSIS — I700 Atherosclerosis of aorta: Secondary | ICD-10-CM | POA: Diagnosis not present

## 2021-08-27 DIAGNOSIS — E1169 Type 2 diabetes mellitus with other specified complication: Secondary | ICD-10-CM | POA: Diagnosis not present

## 2021-08-27 DIAGNOSIS — I10 Essential (primary) hypertension: Secondary | ICD-10-CM | POA: Diagnosis not present

## 2021-08-27 DIAGNOSIS — Z6841 Body Mass Index (BMI) 40.0 and over, adult: Secondary | ICD-10-CM | POA: Diagnosis not present

## 2021-08-27 DIAGNOSIS — E559 Vitamin D deficiency, unspecified: Secondary | ICD-10-CM | POA: Diagnosis not present

## 2021-08-27 DIAGNOSIS — E039 Hypothyroidism, unspecified: Secondary | ICD-10-CM | POA: Diagnosis not present

## 2021-08-27 DIAGNOSIS — E7800 Pure hypercholesterolemia, unspecified: Secondary | ICD-10-CM | POA: Diagnosis not present

## 2021-09-09 DIAGNOSIS — I10 Essential (primary) hypertension: Secondary | ICD-10-CM | POA: Diagnosis not present

## 2021-09-10 DIAGNOSIS — L600 Ingrowing nail: Secondary | ICD-10-CM | POA: Diagnosis not present

## 2021-09-10 DIAGNOSIS — M792 Neuralgia and neuritis, unspecified: Secondary | ICD-10-CM | POA: Diagnosis not present

## 2021-09-11 DIAGNOSIS — Z20822 Contact with and (suspected) exposure to covid-19: Secondary | ICD-10-CM | POA: Diagnosis not present

## 2021-09-16 DIAGNOSIS — I10 Essential (primary) hypertension: Secondary | ICD-10-CM | POA: Diagnosis not present

## 2021-09-17 DIAGNOSIS — E1169 Type 2 diabetes mellitus with other specified complication: Secondary | ICD-10-CM | POA: Diagnosis not present

## 2021-09-17 DIAGNOSIS — I10 Essential (primary) hypertension: Secondary | ICD-10-CM | POA: Diagnosis not present

## 2021-09-17 DIAGNOSIS — E039 Hypothyroidism, unspecified: Secondary | ICD-10-CM | POA: Diagnosis not present

## 2021-09-17 DIAGNOSIS — K219 Gastro-esophageal reflux disease without esophagitis: Secondary | ICD-10-CM | POA: Diagnosis not present

## 2021-09-17 DIAGNOSIS — E7800 Pure hypercholesterolemia, unspecified: Secondary | ICD-10-CM | POA: Diagnosis not present

## 2021-09-18 DIAGNOSIS — Z23 Encounter for immunization: Secondary | ICD-10-CM | POA: Diagnosis not present

## 2021-09-23 DIAGNOSIS — I10 Essential (primary) hypertension: Secondary | ICD-10-CM | POA: Diagnosis not present

## 2021-09-24 DIAGNOSIS — L600 Ingrowing nail: Secondary | ICD-10-CM | POA: Diagnosis not present

## 2021-09-24 DIAGNOSIS — M792 Neuralgia and neuritis, unspecified: Secondary | ICD-10-CM | POA: Diagnosis not present

## 2021-10-07 ENCOUNTER — Telehealth: Payer: Self-pay | Admitting: Cardiology

## 2021-10-07 NOTE — Telephone Encounter (Signed)
Called pt twice, received a message "The person you called a a voicemail that has not been set up yet." Unable to leave a message at this time.

## 2021-10-07 NOTE — Telephone Encounter (Signed)
Patient states that after her brother died she feels anxious all the time. Might be anxiety  attach.  She states that he got her very worried. Patient has been feeling like this for bout a week now.

## 2021-10-08 NOTE — Telephone Encounter (Signed)
Spoke with pt, she was very close to her brother that passed away last week. She feels very anxious and feels overwhelmed. Explained she will need to call her medical doctor top get anti-anxiety medications and to try to get into a support group that can help her with grief and other issues.

## 2021-10-08 NOTE — Telephone Encounter (Signed)
Patient returned call, she can be reached at [phone].

## 2021-11-05 DIAGNOSIS — J321 Chronic frontal sinusitis: Secondary | ICD-10-CM | POA: Diagnosis not present

## 2021-11-05 DIAGNOSIS — H6591 Unspecified nonsuppurative otitis media, right ear: Secondary | ICD-10-CM | POA: Diagnosis not present

## 2021-11-21 DIAGNOSIS — E119 Type 2 diabetes mellitus without complications: Secondary | ICD-10-CM | POA: Diagnosis not present

## 2021-11-21 DIAGNOSIS — H2513 Age-related nuclear cataract, bilateral: Secondary | ICD-10-CM | POA: Diagnosis not present

## 2021-11-21 DIAGNOSIS — H5213 Myopia, bilateral: Secondary | ICD-10-CM | POA: Diagnosis not present

## 2021-12-02 DIAGNOSIS — E7800 Pure hypercholesterolemia, unspecified: Secondary | ICD-10-CM | POA: Diagnosis not present

## 2021-12-02 DIAGNOSIS — E039 Hypothyroidism, unspecified: Secondary | ICD-10-CM | POA: Diagnosis not present

## 2021-12-02 DIAGNOSIS — E559 Vitamin D deficiency, unspecified: Secondary | ICD-10-CM | POA: Diagnosis not present

## 2021-12-02 DIAGNOSIS — E1169 Type 2 diabetes mellitus with other specified complication: Secondary | ICD-10-CM | POA: Diagnosis not present

## 2021-12-02 DIAGNOSIS — I10 Essential (primary) hypertension: Secondary | ICD-10-CM | POA: Diagnosis not present

## 2021-12-02 DIAGNOSIS — Z6841 Body Mass Index (BMI) 40.0 and over, adult: Secondary | ICD-10-CM | POA: Diagnosis not present

## 2021-12-05 ENCOUNTER — Ambulatory Visit (HOSPITAL_COMMUNITY)
Admission: EM | Admit: 2021-12-05 | Discharge: 2021-12-05 | Disposition: A | Discharge status: Home / Self Care | Payer: Medicare Other | Attending: Internal Medicine | Admitting: Internal Medicine

## 2021-12-05 ENCOUNTER — Other Ambulatory Visit: Payer: Self-pay

## 2021-12-05 ENCOUNTER — Encounter (HOSPITAL_COMMUNITY): Payer: Self-pay | Admitting: Emergency Medicine

## 2021-12-05 DIAGNOSIS — S60459A Superficial foreign body of unspecified finger, initial encounter: Secondary | ICD-10-CM

## 2021-12-05 DIAGNOSIS — E119 Type 2 diabetes mellitus without complications: Secondary | ICD-10-CM | POA: Diagnosis not present

## 2021-12-05 MED ORDER — BACITRACIN ZINC 500 UNIT/GM EX OINT
TOPICAL_OINTMENT | CUTANEOUS | Status: AC
  Filled 2021-12-05: qty 0.9

## 2021-12-05 MED ORDER — BACITRACIN ZINC 500 UNIT/GM EX OINT
TOPICAL_OINTMENT | CUTANEOUS | Status: AC
  Filled 2021-12-05: qty 28.35

## 2021-12-05 NOTE — ED Triage Notes (Signed)
Pt reports has splinter in right index finger under nail since last night. Pt reports went somewhere and doctor told her they didn't have tools to remove it. Is diabetic and afraid for infection to set in.

## 2021-12-05 NOTE — Discharge Instructions (Signed)
Daily wound dressing changes with topical antibiotic ointment Soaking warm salt water daily If you have worsening pain, redness, discharge-please return to urgent care to be reevaluated

## 2021-12-06 ENCOUNTER — Other Ambulatory Visit: Payer: Self-pay | Admitting: Cardiology

## 2021-12-09 NOTE — ED Provider Notes (Signed)
[facility]    CSN: [id number] Arrival date & time: 12/05/21  1238      History   Chief Complaint Chief Complaint  Patient presents with   Foreign Body in Skin    HPI Sandy Daniels is a 71 y.o. female comes to the urgent care with left index finger pain of 1 day duration.  Patient states her left index finger against the phone yesterday.  Following that she realized plastic foreign body lodged on the hangnail.  She attempted to remove the foreign body.we could not remove the entire piece of plastic completely.  She comes to the urgent care to be reevaluated.  Pain is of moderate severity at this time.  No bleeding.     HPI  Past Medical History:  Diagnosis Date   Anxiety    Diabetes mellitus without complication ([place])    Diverticulitis large intestine 01/2013   GERD (gastroesophageal reflux disease)    Goiter    History of Helicobacter pylori infection 05/25/2003   gastritis at EGD   Hyperlipidemia    Hypertension    Hypothyroidism    On thyroid supplementation   Morbid obesity with BMI of 45.0-49.9, adult (HCC)    OA (osteoarthritis)    Personal history of colonic adenomas 06/12/2013   Seborrheic dermatitis    Tendonitis of ankle    Vitamin D deficiency     Patient Active Problem List   Diagnosis Date Noted   Abdominal pain 11/21/2020   Irritable bowel syndrome 11/21/2020   Sinus tachycardia by electrocardiogram 08/10/2018   Bilateral edema of lower extremity 05/02/2017   Chronic rhinitis 10/29/2015   Dermatitis 93/23/5573   Metabolic syndrome 22/12/5425   Abnormal finding on EKG 06/08/2015   Diabetes mellitus type 2, uncontrolled    Cellulitis 04/07/2015   UTI (lower urinary tract infection) 04/07/2015   Constipation 09/20/2013   History of colonic polyps 06/12/2013   Essential hypertension 02/25/2013   Hypothyroidism 02/25/2013    Past Surgical History:  Procedure Laterality Date   COLONOSCOPY  03/15/2002, 01/22/2017   internal hemorrhoids, Dr.  Jim Desanctis   ESOPHAGOGASTRODUODENOSCOPY  05/25/2003   H. Pylori, Dr. Jim Desanctis   NM MYOVIEW LTD  10/08/2011   LOW RISK study. EF 68%. Breast attenuation artifact noted otherwise normal study.   POLYPECTOMY     TONSILLECTOMY     TRANSTHORACIC ECHOCARDIOGRAM  10/08/2011   Normal study. No valve lesions. Normal LV function, EF >55%, GR 1 DD. No valve lesions   UPPER GASTROINTESTINAL ENDOSCOPY     VAGINAL HYSTERECTOMY      OB History   No obstetric history on file.      Home Medications    Prior to Admission medications   Medication Sig Start Date End Date Taking? Authorizing Provider  calcium carbonate (TUMS - DOSED IN MG ELEMENTAL CALCIUM) 500 MG chewable tablet Chew 1 tablet by mouth daily.    [provider]  dapagliflozin propanediol (FARXIGA) 10 MG TABS tablet Farxiga 10 mg tablet  Take 1 tablet every day by oral route.    [provider]  levothyroxine (SYNTHROID, LEVOTHROID) 100 MCG tablet Take 100 mcg by mouth daily before breakfast.    [provider]  linaclotide (LINZESS) 290 MCG CAPS capsule Take 1 capsule (290 mcg total) by mouth daily. 01/26/20   Gatha Mayer, MD  metoprolol succinate (TOPROL-XL) 50 MG 24 hr tablet Take 1 tablet (50 mg total) by mouth at bedtime. Take with or immediately following a meal.  Keep upcoming appointment for future refills. 12/08/21 03/08/22  Leonie Man, MD  rosuvastatin (CRESTOR) 10 MG tablet Take 10 mg by mouth daily. 01/14/17   [provider]  Semaglutide (OZEMPIC, 1 MG/DOSE, [place]) Inject 1 mg into the skin daily.    [provider]  triamterene-hydrochlorothiazide (DYAZIDE) 37.5-25 MG capsule Take 1 capsule by mouth every morning. 06/30/17   [provider]    Family History Family History  Problem Relation Age of Onset   Dementia Father    Diabetes Father    Heart disease Father    Hypertension Father    Allergic rhinitis Father    Heart disease Mother    Diabetes Mother     Colon cancer Neg Hx    Rectal cancer Neg Hx    Stomach cancer Neg Hx    Esophageal cancer Neg Hx    Pancreatic cancer Neg Hx    Colon polyps Neg Hx     Social History Social History   Tobacco Use   Smoking status: Former    Types: Cigarettes    Quit date: 01/03/2013    Years since quitting: 8.9   Smokeless tobacco: Never  Vaping Use   Vaping Use: Never used  Substance Use Topics   Alcohol use: No   Drug use: No     Allergies   Betadine [povidone iodine], Neosporin [neomycin-bacitracin zn-polymyx], Nexium [esomeprazole magnesium], and Penicillins   Review of Systems Review of Systems  Musculoskeletal:  Positive for arthralgias. Negative for joint swelling.  Skin:  Positive for wound.    Physical Exam Triage Vital Signs ED Triage Vitals  Enc Vitals Group     BP 12/05/21 1342 (!) 156/65     Pulse Rate 12/05/21 1342 88     Resp 12/05/21 1342 19     Temp 12/05/21 1342 98.7 F (37.1 C)     Temp Source 12/05/21 1342 Oral     SpO2 12/05/21 1342 97 %     Weight --      Height --      Head Circumference --      Peak Flow --      Pain Score 12/05/21 1341 2     Pain Loc --      Pain Edu? --      Excl. in [place]? --    No data found.  Updated Vital Signs BP (!) 156/65 (BP Location: Left Arm)    Pulse 88    Temp 98.7 F (37.1 C) (Oral)    Resp 19    SpO2 97%   Visual Acuity Right Eye Distance:   Left Eye Distance:   Bilateral Distance:    Right Eye Near:   Left Eye Near:    Bilateral Near:     Physical Exam Vitals and nursing note reviewed.  Constitutional:      Appearance: Normal appearance.  Musculoskeletal:        General: Normal range of motion.  Skin:    Comments: Foreign body lodged in the right index finger nailbed.  No swelling or redness.  No discharge.  Neurological:     Mental Status: She is alert.     UC Treatments / Results  Labs (all labs ordered are listed, but only abnormal results are displayed) Labs Reviewed - No data to  display  EKG   Radiology No results found.  Procedures Foreign Body Removal  Date/Time: 12/09/2021 2:36 PM Performed by: Chase Picket, MD Authorized by: Chase Picket,  MD   Consent:    Consent obtained:  Verbal   Consent given by:  Patient   Risks discussed:  Bleeding and infection   Alternatives discussed:  No treatment Procedure details:    Localization method:  Visualized   Dissection of underlying tissues: no     Bloodless field: no     Removal mechanism:  Forceps   Foreign bodies recovered:  1   Intact foreign body removal: yes   Post-procedure details:    Neurovascular status: intact     Confirmation:  No additional foreign bodies on visualization   Procedure completion:  Tolerated well, no immediate complications (including critical care time)  Medications Ordered in UC Medications - No data to display  Initial Impression / Assessment and Plan / UC Course  I have reviewed the triage vital signs and the nursing notes.  Pertinent labs & imaging results that were available during my care of the patient were reviewed by me and considered in my medical decision making (see chart for details).     1.  Foreign body in the right index finger nailbed: Foreign body was removed successfully No visible residual foreign body observed. Daily wound dressing changes with topical antibiotic ointment Return precautions given. Final Clinical Impressions(s) / UC Diagnoses   Final diagnoses:  Foreign body of finger     Discharge Instructions      Daily wound dressing changes with topical antibiotic ointment Soaking warm salt water daily If you have worsening pain, redness, discharge-please return to urgent care to be reevaluated   ED Prescriptions   None    PDMP not reviewed this encounter.   Chase Picket, MD 12/09/21 [phone]

## 2021-12-11 ENCOUNTER — Encounter (HOSPITAL_COMMUNITY): Payer: Self-pay | Admitting: Emergency Medicine

## 2021-12-11 ENCOUNTER — Ambulatory Visit (HOSPITAL_COMMUNITY)
Admission: EM | Admit: 2021-12-11 | Discharge: 2021-12-11 | Disposition: A | Discharge status: Home / Self Care | Payer: Medicare Other | Attending: Family Medicine | Admitting: Family Medicine

## 2021-12-11 DIAGNOSIS — L600 Ingrowing nail: Secondary | ICD-10-CM | POA: Diagnosis not present

## 2021-12-11 MED ORDER — MUPIROCIN 2 % EX OINT: 1.0000 "application " | TOPICAL_OINTMENT | Freq: Two times a day (BID) | CUTANEOUS | 0 refills | Status: DC

## 2021-12-11 MED ORDER — CEPHALEXIN 250 MG PO CAPS
250.0000 mg | ORAL_CAPSULE | Freq: Three times a day (TID) | ORAL | 0 refills | Status: AC
Start: 2021-12-11 — End: 2021-12-16

## 2021-12-11 NOTE — Discharge Instructions (Addendum)
Take cephalexin 250 mg, 1 capsule 3 times daily for 5 days.  Use mupirocin antibiotic ointment on that part of your toe twice daily until better

## 2021-12-11 NOTE — ED Provider Notes (Signed)
[facility]    CSN: [id number] Arrival date & time: 12/11/21  7893      History   Chief Complaint Chief Complaint  Patient presents with   Nail Problem    HPI Sandy Daniels is a 71 y.o. female.   HPI Here for a "bubbling up" area on her left great medial toe at the nail fold.  About 1 month ago she had a procedure to relieve an ingrown toenail in that same spot.  Has not seen any drainage. The bubble area has improved.  No fever or chills.  She does have diabetes, and her last A1c was 6.  Past Medical History:  Diagnosis Date   Anxiety    Diabetes mellitus without complication ([place])    Diverticulitis large intestine 01/2013   GERD (gastroesophageal reflux disease)    Goiter    History of Helicobacter pylori infection 05/25/2003   gastritis at EGD   Hyperlipidemia    Hypertension    Hypothyroidism    On thyroid supplementation   Morbid obesity with BMI of 45.0-49.9, adult (HCC)    OA (osteoarthritis)    Personal history of colonic adenomas 06/12/2013   Seborrheic dermatitis    Tendonitis of ankle    Vitamin D deficiency     Patient Active Problem List   Diagnosis Date Noted   Abdominal pain 11/21/2020   Irritable bowel syndrome 11/21/2020   Sinus tachycardia by electrocardiogram 08/10/2018   Bilateral edema of lower extremity 05/02/2017   Chronic rhinitis 10/29/2015   Dermatitis 81/11/7508   Metabolic syndrome 25/85/2778   Abnormal finding on EKG 06/08/2015   Diabetes mellitus type 2, uncontrolled    Cellulitis 04/07/2015   UTI (lower urinary tract infection) 04/07/2015   Constipation 09/20/2013   History of colonic polyps 06/12/2013   Essential hypertension 02/25/2013   Hypothyroidism 02/25/2013    Past Surgical History:  Procedure Laterality Date   COLONOSCOPY  03/15/2002, 01/22/2017   internal hemorrhoids, Dr. Jim Desanctis   ESOPHAGOGASTRODUODENOSCOPY  05/25/2003   H. Pylori, Dr. Jim Desanctis   NM MYOVIEW LTD  10/08/2011   LOW RISK study. EF  68%. Breast attenuation artifact noted otherwise normal study.   POLYPECTOMY     TONSILLECTOMY     TRANSTHORACIC ECHOCARDIOGRAM  10/08/2011   Normal study. No valve lesions. Normal LV function, EF >55%, GR 1 DD. No valve lesions   UPPER GASTROINTESTINAL ENDOSCOPY     VAGINAL HYSTERECTOMY      OB History   No obstetric history on file.      Home Medications    Prior to Admission medications   Medication Sig Start Date End Date Taking? Authorizing Provider  cephALEXin (KEFLEX) 250 MG capsule Take 1 capsule (250 mg total) by mouth 3 (three) times daily for 5 days. 12/11/21 12/16/21 Yes Scottie Metayer, Gwenlyn Perking, MD  mupirocin ointment (BACTROBAN) 2 % Apply 1 application topically 2 (two) times daily. To affected area till better 12/11/21  Yes [place], Gwenlyn Perking, MD  calcium carbonate (TUMS - DOSED IN MG ELEMENTAL CALCIUM) 500 MG chewable tablet Chew 1 tablet by mouth daily.    [provider]  dapagliflozin propanediol (FARXIGA) 10 MG TABS tablet Farxiga 10 mg tablet  Take 1 tablet every day by oral route.    [provider]  levothyroxine (SYNTHROID, LEVOTHROID) 100 MCG tablet Take 100 mcg by mouth daily before breakfast.    [provider]  linaclotide (LINZESS) 290 MCG CAPS capsule Take 1 capsule (290 mcg total) by  mouth daily. 01/26/20   Gatha Mayer, MD  metoprolol succinate (TOPROL-XL) 50 MG 24 hr tablet Take 1 tablet (50 mg total) by mouth at bedtime. Take with or immediately following a meal.  Keep upcoming appointment for future refills. 12/08/21 03/08/22  Leonie Man, MD  rosuvastatin (CRESTOR) 10 MG tablet Take 10 mg by mouth daily. 01/14/17   [provider]  Semaglutide (OZEMPIC, 1 MG/DOSE, [place]) Inject 1 mg into the skin daily.    [provider]  triamterene-hydrochlorothiazide (DYAZIDE) 37.5-25 MG capsule Take 1 capsule by mouth every morning. 06/30/17   [provider]    Family History Family History  Problem Relation Age of  Onset   Dementia Father    Diabetes Father    Heart disease Father    Hypertension Father    Allergic rhinitis Father    Heart disease Mother    Diabetes Mother    Colon cancer Neg Hx    Rectal cancer Neg Hx    Stomach cancer Neg Hx    Esophageal cancer Neg Hx    Pancreatic cancer Neg Hx    Colon polyps Neg Hx     Social History Social History   Tobacco Use   Smoking status: Former    Types: Cigarettes    Quit date: 01/03/2013    Years since quitting: 8.9   Smokeless tobacco: Never  Vaping Use   Vaping Use: Never used  Substance Use Topics   Alcohol use: No   Drug use: No     Allergies   Betadine [povidone iodine], Neosporin [neomycin-bacitracin zn-polymyx], Nexium [esomeprazole magnesium], and Penicillins   Review of Systems Review of Systems   Physical Exam Triage Vital Signs ED Triage Vitals  Enc Vitals Group     BP 12/11/21 1157 (!) 162/84     Pulse Rate 12/11/21 1153 81     Resp --      Temp 12/11/21 1153 98.4 F (36.9 C)     Temp Source 12/11/21 1153 Oral     SpO2 --      Weight --      Height --      Head Circumference --      Peak Flow --      Pain Score 12/11/21 1152 6     Pain Loc --      Pain Edu? --      Excl. in [place]? --    No data found.  Updated Vital Signs BP (!) 162/84    Pulse 81    Temp 98.4 F (36.9 C) (Oral)   Visual Acuity Right Eye Distance:   Left Eye Distance:   Bilateral Distance:    Right Eye Near:   Left Eye Near:    Bilateral Near:     Physical Exam Vitals reviewed.  Constitutional:      General: She is not in acute distress.    Appearance: She is not toxic-appearing.  Musculoskeletal:        General: No tenderness.  Skin:    Comments: There is some onycholysis of the medial aspect of her left great toenail.  There is no swelling noted currently at that nail fold, but there is possibly some dusky erythema there.  No discharge.  The distal medial corner of the left great toenail has been removed.   Neurological:     Mental Status: She is alert.     UC Treatments / Results  Labs (all labs ordered are listed, but only  abnormal results are displayed) Labs Reviewed - No data to display  EKG   Radiology No results found.  Procedures Procedures (including critical care time)  Medications Ordered in UC Medications - No data to display  Initial Impression / Assessment and Plan / UC Course  I have reviewed the triage vital signs and the nursing notes.  Pertinent labs & imaging results that were available during my care of the patient were reviewed by me and considered in my medical decision making (see chart for details).     We will treat with topical mupirocin, and 5 days of an oral antibiotic.  She will see her podiatrist next week  Will use keflex; she has taken that without problem, despite being allergic to pcn Final Clinical Impressions(s) / UC Diagnoses   Final diagnoses:  Ingrown toenail     Discharge Instructions      Take cephalexin 250 mg, 1 capsule 3 times daily for 5 days.  Use mupirocin antibiotic ointment on that part of your toe twice daily until better     ED Prescriptions     Medication Sig Dispense Auth. Provider   mupirocin ointment (BACTROBAN) 2 % Apply 1 application topically 2 (two) times daily. To affected area till better 22 g Barrett Henle, MD   cephALEXin (KEFLEX) 250 MG capsule Take 1 capsule (250 mg total) by mouth 3 (three) times daily for 5 days. 15 capsule Windy Carina Gwenlyn Perking, MD      PDMP not reviewed this encounter.   Barrett Henle, MD 12/11/21 [phone]

## 2021-12-11 NOTE — ED Triage Notes (Signed)
Pt reports had ingrown toe nail cut on left big toe. Reports bubbling beside medial side of left toe nail. Pt concerned about it due to having diabetic. Unable to get in to apt til next week.

## 2021-12-18 DIAGNOSIS — E1165 Type 2 diabetes mellitus with hyperglycemia: Secondary | ICD-10-CM | POA: Diagnosis not present

## 2021-12-18 DIAGNOSIS — L03032 Cellulitis of left toe: Secondary | ICD-10-CM | POA: Diagnosis not present

## 2021-12-18 DIAGNOSIS — I10 Essential (primary) hypertension: Secondary | ICD-10-CM | POA: Diagnosis not present

## 2021-12-18 DIAGNOSIS — R609 Edema, unspecified: Secondary | ICD-10-CM | POA: Diagnosis not present

## 2021-12-18 DIAGNOSIS — E119 Type 2 diabetes mellitus without complications: Secondary | ICD-10-CM | POA: Diagnosis not present

## 2021-12-18 DIAGNOSIS — E1169 Type 2 diabetes mellitus with other specified complication: Secondary | ICD-10-CM | POA: Diagnosis not present

## 2021-12-18 DIAGNOSIS — E7800 Pure hypercholesterolemia, unspecified: Secondary | ICD-10-CM | POA: Diagnosis not present

## 2021-12-19 NOTE — Progress Notes (Signed)
Cardiology Clinic Note   Patient Name: Sandy Daniels Date of Encounter: 12/22/2021  Primary Care Provider:  Deland Pretty, MD Primary Cardiologist:  Glenetta Hew, MD  Patient Profile    Sandy Daniels 71 year old female presents to the clinic today for essential hypertension, sinus tachycardia, and lower extremity edema.  Past Medical History    Past Medical History:  Diagnosis Date   Anxiety    Diabetes mellitus without complication ([place])    Diverticulitis large intestine 01/2013   GERD (gastroesophageal reflux disease)    Goiter    History of Helicobacter pylori infection 05/25/2003   gastritis at EGD   Hyperlipidemia    Hypertension    Hypothyroidism    On thyroid supplementation   Morbid obesity with BMI of 45.0-49.9, adult (HCC)    OA (osteoarthritis)    Personal history of colonic adenomas 06/12/2013   Seborrheic dermatitis    Tendonitis of ankle    Vitamin D deficiency    Past Surgical History:  Procedure Laterality Date   COLONOSCOPY  03/15/2002, 01/22/2017   internal hemorrhoids, Dr. Jim Desanctis   ESOPHAGOGASTRODUODENOSCOPY  05/25/2003   H. Pylori, Dr. Jim Desanctis   NM MYOVIEW LTD  10/08/2011   LOW RISK study. EF 68%. Breast attenuation artifact noted otherwise normal study.   POLYPECTOMY     TONSILLECTOMY     TRANSTHORACIC ECHOCARDIOGRAM  10/08/2011   Normal study. No valve lesions. Normal LV function, EF >55%, GR 1 DD. No valve lesions   UPPER GASTROINTESTINAL ENDOSCOPY     VAGINAL HYSTERECTOMY      Allergies  Allergies  Allergen Reactions   Betadine [Povidone Iodine] Rash    Severe bumps and blistering   Neosporin [Neomycin-Bacitracin Zn-Polymyx] Rash    blistering   Nexium [Esomeprazole Magnesium] Other (See Comments)    Headache   Penicillins Hives and Swelling    Did it involve swelling of the face/tongue/throat, SOB, or low BP? No Did it involve sudden or severe rash/hives, skin peeling, or any reaction on the inside of your mouth or nose?  No Did you need to seek medical attention at a hospital or doctor's office? No When did it last happen?       If all above answers are "NO", may proceed with cephalosporin use.     History of Present Illness    Sandy Daniels has a PMH of type 2 diabetes, metabolic syndrome, bilateral lower extremity edema, essential hypertension, and sinus tachycardia.  She was seen by Dr. Ellyn Hack 11/24/2019.  She had no significant cardiac symptoms at that time.  She did note some nagging off and on chest discomfort which was apparent at rest.  She was very anxious.  She reported increased stress at home.  She felt that her anxiety and stress were driving her tachycardia.  She was seen by Dr. Ellyn Hack 12/16/2020.  During that time she continued to do well from a cardiac standpoint.  She reported that she had been helping to care for her 8 year old her cousin's 56 year old daughter.  She was having a hard time coping to evaluate 2 properties.  She was doing her best to sleep at her house at night.  She continued to work on her diet and lost around 30 pounds.  Her heart rate was less labile.  She denied chest discomfort, chest pressure.  She was limited in her physical activity due to musculoskeletal pain and dyspnea.  She presents to the clinic today for follow-up evaluation and states she feels  well.  She reports that she is lost around 60 pounds.  She notes that her blood pressure at home is in the 100 teens-120s over 60s-70s.  She continues to work to settle her aunt's estate.  This has increased her stress level.  She volunteers with [employer] and other volunteer organizations.  She has been walking around 2 miles per day.  She reports that she is following a low-sodium diet.  We reviewed her EKG which shows normal sinus rhythm 80 bpm.  She expressed understanding.  We will continue her current physical activity, diet, and plan follow-up in 12 months.  Today she denies chest pain, shortness of breath, lower  extremity edema, fatigue, palpitations, melena, hematuria, hemoptysis, diaphoresis, weakness, presyncope, syncope, orthopnea, and PND.   Home Medications    Prior to Admission medications   Medication Sig Start Date End Date Taking? Authorizing Provider  calcium carbonate (TUMS - DOSED IN MG ELEMENTAL CALCIUM) 500 MG chewable tablet Chew 1 tablet by mouth daily.    [provider]  dapagliflozin propanediol (FARXIGA) 10 MG TABS tablet Farxiga 10 mg tablet  Take 1 tablet every day by oral route.    [provider]  levothyroxine (SYNTHROID, LEVOTHROID) 100 MCG tablet Take 100 mcg by mouth daily before breakfast.    [provider]  linaclotide (LINZESS) 290 MCG CAPS capsule Take 1 capsule (290 mcg total) by mouth daily. 01/26/20   Gatha Mayer, MD  metoprolol succinate (TOPROL-XL) 50 MG 24 hr tablet Take 1 tablet (50 mg total) by mouth at bedtime. Take with or immediately following a meal.  Keep upcoming appointment for future refills. 12/08/21 03/08/22  Leonie Man, MD  mupirocin ointment (BACTROBAN) 2 % Apply 1 application topically 2 (two) times daily. To affected area till better 12/11/21   Barrett Henle, MD  rosuvastatin (CRESTOR) 10 MG tablet Take 10 mg by mouth daily. 01/14/17   [provider]  Semaglutide (OZEMPIC, 1 MG/DOSE, [place]) Inject 1 mg into the skin daily.    [provider]  triamterene-hydrochlorothiazide (DYAZIDE) 37.5-25 MG capsule Take 1 capsule by mouth every morning. 06/30/17   [provider]    Family History    Family History  Problem Relation Age of Onset   Dementia Father    Diabetes Father    Heart disease Father    Hypertension Father    Allergic rhinitis Father    Heart disease Mother    Diabetes Mother    Colon cancer Neg Hx    Rectal cancer Neg Hx    Stomach cancer Neg Hx    Esophageal cancer Neg Hx    Pancreatic cancer Neg Hx    Colon polyps Neg Hx    She indicated that her mother is  deceased. She indicated that her father is deceased. She indicated that her sister is alive. She indicated that her brother is alive. She indicated that her maternal grandmother is deceased. She indicated that her maternal grandfather is deceased. She indicated that her paternal grandmother is deceased. She indicated that her paternal grandfather is deceased. She indicated that the status of her neg hx is unknown.  Social History    Social History   Socioeconomic History   Marital status: Divorced    Spouse name: Not on file   Number of children: 1   Years of education: Not on file   Highest education level: Not on file  Occupational History    Employer: [employer]  Tobacco  Use   Smoking status: Former    Types: Cigarettes    Quit date: 01/03/2013    Years since quitting: 8.9   Smokeless tobacco: Never  Vaping Use   Vaping Use: Never used  Substance and Sexual Activity   Alcohol use: No   Drug use: No   Sexual activity: Not on file  Other Topics Concern   Not on file  Social History Narrative   [employer]   Divorced   1 daughter   She does significant out of volunteer work with 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   --> She has been quite distressed with the fact that she is not able to do her routine fundraising due to COVID-19 restrictions.      She is now the caregiver for her 43 year old aunt and her cousin's 85 year old granddaughter.  Essentially, both her cousin and niece (the mother of the 80 year old) have passed away over the last year plus.  Her cousin had been caring for both until she died.  This is left Shahla as the only remaining family for the 2 ladies-the 72 year old and 72 year old.   Caring for the aunt is very stressful.  She has to go up and down lots of steps is doing much more housework, basically doing to the hospital for 2 houses.  She is also trying to somehow figure out how to be the  caregiver of a 72 year old again-having a hard time dealing with the changes of the new generation of children.      She has made significant dietary adjustments and is increasing her exercise..   Social Determinants of Health   Financial Resource Strain: Not on file  Food Insecurity: Not on file  Transportation Needs: Not on file  Physical Activity: Not on file  Stress: Not on file  Social Connections: Not on file  Intimate Partner Violence: Not on file     Review of Systems    General:  No chills, fever, night sweats or weight changes.  Cardiovascular:  No chest pain, dyspnea on exertion, edema, orthopnea, palpitations, paroxysmal nocturnal dyspnea. Dermatological: No rash, lesions/masses Respiratory: No cough, dyspnea Urologic: No hematuria, dysuria Abdominal:   No nausea, vomiting, diarrhea, bright red blood per rectum, melena, or hematemesis Neurologic:  No visual changes, wkns, changes in mental status. All other systems reviewed and are otherwise negative except as noted above.  Physical Exam    VS:  BP 128/78    Pulse 80    Ht 5\' 6"  (1.676 m)    Wt 266 lb 3.2 oz (120.7 kg)    SpO2 98%    BMI 42.97 kg/m  , BMI Body mass index is 42.97 kg/m. GEN: Well nourished, well developed, in no acute distress. HEENT: normal. Neck: Supple, no JVD, carotid bruits, or masses. Cardiac: RRR, no murmurs, rubs, or gallops. No clubbing, cyanosis, bilateral lower extremity generalized nonpitting edema.  Radials/DP/PT 2+ and equal bilaterally.  Respiratory:  Respirations regular and unlabored, clear to auscultation bilaterally. GI: Soft, nontender, nondistended, BS + x 4. MS: no deformity or atrophy. Skin: warm and dry, no rash. Neuro:  Strength and sensation are intact. Psych: Normal affect.  Accessory Clinical Findings    Recent Labs: No results found for requested labs within last 8760 hours.   Recent Lipid Panel No results found for: CHOL, TRIG, HDL, CHOLHDL, VLDL, LDLCALC,  LDLDIRECT  ECG personally reviewed by me today- normal sinus rhythm rightward axis deviation 80  bpm - No acute changes  EKG 12/16/2020 Sinus tachycardia rightward axis deviation 111 bpm  Assessment & Plan   1.  Essential hypertension-BP today 128/78.  Well-controlled at home. Continue metoprolol, triamterene, hydrochlorothiazide Heart healthy low-sodium diet-salty 6 given Increase physical activity as tolerated  Sinus tachycardia-EKG today shows normal sinus rhythm rightward axis deviation 80 bpm. Continue metoprolol, Heart healthy low-sodium diet-salty 6 given Increase physical activity as tolerated  Bilateral lower extremity edema-generalized bilateral lower extremity nonpitting edema.  Stable. Continue Farxiga Heart healthy low-sodium diet. Increase physical activity as tolerated Elevate lower extremities when not active  Metabolic syndrome, type 2 diabetes-continues to lose weight.  Tolerating Ozempic well. Continue calorie restricted diet Continue Ozempic Increase physical activity as tolerated Follows with PCP  Disposition: Follow-up with Dr. Ellyn Hack 9-12 months.  Jossie Ng. Criss Pallone NP-C    12/22/2021, 11:37 AM [place] [address] Office [phone] Fax [phone]  Notice: This dictation was prepared with Dragon dictation along with smaller phrase technology. Any transcriptional errors that result from this process are unintentional and may not be corrected upon review.  I spent 14 minutes examining this patient, reviewing medications, and using patient centered shared decision making involving her cardiac care.  Prior to her visit I spent greater than 20 minutes reviewing her past medical history,  medications, and prior cardiac tests.

## 2021-12-21 DIAGNOSIS — Z20822 Contact with and (suspected) exposure to covid-19: Secondary | ICD-10-CM | POA: Diagnosis not present

## 2021-12-22 ENCOUNTER — Encounter: Payer: Self-pay | Admitting: General Practice

## 2021-12-22 ENCOUNTER — Other Ambulatory Visit: Payer: Self-pay

## 2021-12-22 ENCOUNTER — Ambulatory Visit (INDEPENDENT_AMBULATORY_CARE_PROVIDER_SITE_OTHER): Payer: Medicare Other | Admitting: General Practice

## 2021-12-22 VITALS — BP 128/78 | HR 80 | Ht 66.0 in | Wt 266.2 lb

## 2021-12-22 DIAGNOSIS — R600 Localized edema: Secondary | ICD-10-CM | POA: Diagnosis not present

## 2021-12-22 DIAGNOSIS — E1165 Type 2 diabetes mellitus with hyperglycemia: Secondary | ICD-10-CM | POA: Diagnosis not present

## 2021-12-22 DIAGNOSIS — I10 Essential (primary) hypertension: Secondary | ICD-10-CM

## 2021-12-22 DIAGNOSIS — R000 Tachycardia, unspecified: Secondary | ICD-10-CM

## 2021-12-22 NOTE — Patient Instructions (Signed)
Medication Instructions:  The current medical regimen is effective;  continue present plan and medications as directed. Please refer to the Current Medication list given to you today.  *If you need a refill on your cardiac medications before your next appointment, please call your pharmacy*  Lab Work:   Testing/Procedures:  NONE    NONE  Special Instructions PLEASE READ AND FOLLOW SALTY 6-ATTACHED-1,800mg  daily  PLEASE MAINTAIN PHYSICAL ACTIVITY AS TOLERATED   PLEASE READ AND FOLLOW STRESS REDUCTION TIPS-ATTACHED  Follow-Up: Your next appointment:  12 month(s) In Person with Sandy Hew, MD    Please call our office 2 months in advance to schedule this appointment  :1  At [facility], you and your health needs are our priority.  As part of our continuing mission to provide you with exceptional heart care, we have created designated Provider Care Teams.  These Care Teams include your primary Cardiologist (physician) and Advanced Practice Providers (APPs -  Physician Assistants and Nurse Practitioners) who all work together to provide you with the care you need, when you need it.            6 SALTY THINGS TO AVOID     1,800MG  DAILY     Mindfulness-Based Stress Reduction Mindfulness-based stress reduction (MBSR) is a program that helps people learn to practice mindfulness. Mindfulness is the practice of consciously paying attention to the present moment. MBSR focuses on developing self-awareness, which lets you respond to life stress without judgment or negative feelings. It can be learned and practiced through techniques such as education, breathing exercises, meditation, and yoga. MBSR includes several mindfulness techniques in one program. MBSR works best when you understand the treatment, are willing to try new things, and can commit to spending time practicing what you learn. MBSR training may include learning about: How your feelings, thoughts, and reactions affect your  body. New ways to respond to things that cause negative thoughts to start (triggers). How to notice your thoughts and let go of them. Practicing awareness of everyday things that you normally do without thinking. The techniques and goals of different types of meditation. What are the benefits of MBSR? MBSR can have many benefits, which include helping you to: Develop self-awareness. This means knowing and understanding yourself. Learn skills and attitudes that help you to take part in your own health care. Learn new ways to care for yourself. Be more accepting about how things are, and let things go. Be less judgmental and approach things with an open mind. Be patient with yourself and trust yourself more. MBSR has also been shown to: Reduce negative emotions, such as sadness, overwhelm, and worry. Improve memory and focus. Change how you sense and react to pain. Boost your body's ability to fight infections. Help you connect better with other people. Improve your sense of well-being. How to practice mindfulness To do a basic awareness exercise: Find a comfortable place to sit. Pay attention to the present moment. Notice your thoughts, feelings, and surroundings just as they are. Avoid judging yourself, your feelings, or your surroundings. Make note of any judgment that comes up and let it go. Your mind may wander, and that is okay. Make note of when your thoughts drift, and return your attention to the present moment. To do basic mindfulness meditation: Find a comfortable place to sit. This may include a stable chair or a firm floor cushion. Sit upright with your back straight. Let your arms fall next to your sides, with your hands resting  on your legs. If you are sitting in a chair, rest your feet flat on the floor. If you are sitting on a cushion, cross your legs in front of you. Keep your head in a neutral position with your chin dropped slightly. Relax your jaw and rest the tip of  your tongue on the roof of your mouth. Drop your gaze to the floor or close your eyes. Breathe normally and pay attention to your breath. Feel the air moving in and out of your nose. Feel your belly expanding and relaxing with each breath. Your mind may wander, and that is okay. Make note of when your thoughts drift, and return your attention to your breath. Avoid judging yourself, your feelings, or your surroundings. Make note of any judgment or feelings that come up, let them go, and bring your attention back to your breath. When you are ready, lift your gaze or open your eyes. Pay attention to how your body feels after the meditation. Follow these instructions at home:  Find a local in-person or online MBSR program. Set aside some time regularly for mindfulness practice. Practice every day if you can. Even 10 minutes of practice is helpful. Find a mindfulness practice that works best for you. This may include one or more of the following: Meditation. This involves focusing your mind on a certain thought or activity. Breathing awareness exercises. These help you to stay present by focusing on your breath. Body scan. For this practice, you lie down and pay attention to each part of your body from head to toe. You can identify tension and soreness and consciously relax parts of your body. Yoga. Yoga involves stretching and breathing, and it can improve your ability to move and be flexible. It can also help you to test your body's limits, which can help you release stress. Mindful eating. This way of eating involves focusing on the taste, texture, color, and smell of each bite of food. This slows down eating and helps you feel full sooner. For this reason, it can be an important part of a weight loss plan. Find a podcast or recording that provides guidance for breathing awareness, body scan, or meditation exercises. You can listen to these any time when you have a free moment to rest without  distractions. Follow your treatment plan as told by your health care provider. This may include taking regular medicines and making changes to your diet or lifestyle as recommended. Where to find more information You can find more information about MBSR from: Your health care provider. Community-based meditation centers or programs. Programs offered near you. Summary Mindfulness-based stress reduction (MBSR) is a program that teaches you how to consciously pay attention to the present moment. It is used to help you deal better with daily stress, feelings, and pain. MBSR focuses on developing self-awareness, which allows you to respond to life stress without judgment or negative feelings. MBSR programs may involve learning different mindfulness practices, such as breathing exercises, meditation, yoga, body scan, or mindful eating. Find a mindfulness practice that works best for you, and set aside time for it on a regular basis. This information is not intended to replace advice given to you by your health care provider. Make sure you discuss any questions you have with your health care provider. Document Revised: 05/29/2021 Document Reviewed: 05/29/2021 Elsevier Patient Education  [place].

## 2021-12-26 DIAGNOSIS — Z20822 Contact with and (suspected) exposure to covid-19: Secondary | ICD-10-CM | POA: Diagnosis not present

## 2022-01-17 DIAGNOSIS — Z20822 Contact with and (suspected) exposure to covid-19: Secondary | ICD-10-CM | POA: Diagnosis not present

## 2022-01-29 DIAGNOSIS — Z20822 Contact with and (suspected) exposure to covid-19: Secondary | ICD-10-CM | POA: Diagnosis not present

## 2022-02-01 DIAGNOSIS — Z20822 Contact with and (suspected) exposure to covid-19: Secondary | ICD-10-CM | POA: Diagnosis not present

## 2022-02-11 DIAGNOSIS — E559 Vitamin D deficiency, unspecified: Secondary | ICD-10-CM | POA: Diagnosis not present

## 2022-02-11 DIAGNOSIS — E1165 Type 2 diabetes mellitus with hyperglycemia: Secondary | ICD-10-CM | POA: Diagnosis not present

## 2022-02-16 DIAGNOSIS — Z20822 Contact with and (suspected) exposure to covid-19: Secondary | ICD-10-CM | POA: Diagnosis not present

## 2022-02-28 DIAGNOSIS — Z20822 Contact with and (suspected) exposure to covid-19: Secondary | ICD-10-CM | POA: Diagnosis not present

## 2022-03-04 ENCOUNTER — Other Ambulatory Visit: Payer: Self-pay | Admitting: Cardiology

## 2022-03-06 DIAGNOSIS — Z20822 Contact with and (suspected) exposure to covid-19: Secondary | ICD-10-CM | POA: Diagnosis not present

## 2022-03-08 DIAGNOSIS — Z20822 Contact with and (suspected) exposure to covid-19: Secondary | ICD-10-CM | POA: Diagnosis not present

## 2022-03-09 DIAGNOSIS — E559 Vitamin D deficiency, unspecified: Secondary | ICD-10-CM | POA: Diagnosis not present

## 2022-03-09 DIAGNOSIS — K219 Gastro-esophageal reflux disease without esophagitis: Secondary | ICD-10-CM | POA: Diagnosis not present

## 2022-03-09 DIAGNOSIS — Z20822 Contact with and (suspected) exposure to covid-19: Secondary | ICD-10-CM | POA: Diagnosis not present

## 2022-03-09 DIAGNOSIS — I10 Essential (primary) hypertension: Secondary | ICD-10-CM | POA: Diagnosis not present

## 2022-03-09 DIAGNOSIS — E7800 Pure hypercholesterolemia, unspecified: Secondary | ICD-10-CM | POA: Diagnosis not present

## 2022-03-09 DIAGNOSIS — E1165 Type 2 diabetes mellitus with hyperglycemia: Secondary | ICD-10-CM | POA: Diagnosis not present

## 2022-03-09 DIAGNOSIS — E039 Hypothyroidism, unspecified: Secondary | ICD-10-CM | POA: Diagnosis not present

## 2022-03-18 DIAGNOSIS — Z23 Encounter for immunization: Secondary | ICD-10-CM | POA: Diagnosis not present

## 2022-05-09 ENCOUNTER — Telehealth: Payer: Self-pay | Admitting: Internal Medicine

## 2022-05-09 NOTE — Telephone Encounter (Signed)
Several days of gas - belching and flatus and constipation Has been eating collards MOM he;ped but still feels constipated No fever or bleeding and no sig pain Hemorrhoids itchy  1) Repeat MOM 2) soft bland diet 3) hydrate 4) Prep H 5) call back for an appointment if no better or if severe sx to ED

## 2022-06-02 ENCOUNTER — Other Ambulatory Visit: Payer: Self-pay | Admitting: Cardiology

## 2022-07-16 DIAGNOSIS — I10 Essential (primary) hypertension: Secondary | ICD-10-CM | POA: Diagnosis not present

## 2022-07-16 DIAGNOSIS — E7800 Pure hypercholesterolemia, unspecified: Secondary | ICD-10-CM | POA: Diagnosis not present

## 2022-07-16 DIAGNOSIS — E1165 Type 2 diabetes mellitus with hyperglycemia: Secondary | ICD-10-CM | POA: Diagnosis not present

## 2022-07-16 DIAGNOSIS — E1169 Type 2 diabetes mellitus with other specified complication: Secondary | ICD-10-CM | POA: Diagnosis not present

## 2022-08-10 ENCOUNTER — Telehealth: Payer: Self-pay | Admitting: Internal Medicine

## 2022-08-10 ENCOUNTER — Other Ambulatory Visit: Payer: Self-pay | Admitting: Internal Medicine

## 2022-08-10 DIAGNOSIS — E7800 Pure hypercholesterolemia, unspecified: Secondary | ICD-10-CM | POA: Diagnosis not present

## 2022-08-10 DIAGNOSIS — E559 Vitamin D deficiency, unspecified: Secondary | ICD-10-CM | POA: Diagnosis not present

## 2022-08-10 DIAGNOSIS — Z0000 Encounter for general adult medical examination without abnormal findings: Secondary | ICD-10-CM | POA: Diagnosis not present

## 2022-08-10 DIAGNOSIS — E039 Hypothyroidism, unspecified: Secondary | ICD-10-CM | POA: Diagnosis not present

## 2022-08-10 DIAGNOSIS — E1165 Type 2 diabetes mellitus with hyperglycemia: Secondary | ICD-10-CM | POA: Diagnosis not present

## 2022-08-10 DIAGNOSIS — R1030 Lower abdominal pain, unspecified: Secondary | ICD-10-CM

## 2022-08-10 MED ORDER — DICYCLOMINE HCL 20 MG PO TABS: 20.0000 mg | ORAL_TABLET | Freq: Three times a day (TID) | ORAL | 0 refills | Status: DC | PRN

## 2022-08-10 NOTE — Telephone Encounter (Signed)
Pt reports < 24 hours of diarrhea with cramping lower abd pain No fever + Belching No vomiting Diarrhea relieves the lower abd pain  Asking for med for cramping.  A/P: viral gastroenteritis, cannot exclude diverticulitis, but the latter felt less likely given pain resolves after BM  Dicyclomine 20 mg TIDPRN Office to check on her tomorrow Liquid diet, avoid dairy  Time provided for questions and answers and she thanked me for the call If worsening she is advised to seek emergency care

## 2022-08-11 NOTE — Telephone Encounter (Signed)
Pt was contacted for symptom update: Pt stated that she is feeling much better and the medication is working well. Pt notified to contact our office if her pain worsens again: Pt verbalized understanding with all questions answered.

## 2022-08-17 DIAGNOSIS — E039 Hypothyroidism, unspecified: Secondary | ICD-10-CM | POA: Diagnosis not present

## 2022-08-17 DIAGNOSIS — Z0000 Encounter for general adult medical examination without abnormal findings: Secondary | ICD-10-CM | POA: Diagnosis not present

## 2022-08-17 DIAGNOSIS — E1165 Type 2 diabetes mellitus with hyperglycemia: Secondary | ICD-10-CM | POA: Diagnosis not present

## 2022-08-17 DIAGNOSIS — E559 Vitamin D deficiency, unspecified: Secondary | ICD-10-CM | POA: Diagnosis not present

## 2022-08-17 DIAGNOSIS — I10 Essential (primary) hypertension: Secondary | ICD-10-CM | POA: Diagnosis not present

## 2022-08-17 DIAGNOSIS — I700 Atherosclerosis of aorta: Secondary | ICD-10-CM | POA: Diagnosis not present

## 2022-08-17 DIAGNOSIS — K219 Gastro-esophageal reflux disease without esophagitis: Secondary | ICD-10-CM | POA: Diagnosis not present

## 2022-08-17 DIAGNOSIS — Z23 Encounter for immunization: Secondary | ICD-10-CM | POA: Diagnosis not present

## 2022-08-30 ENCOUNTER — Other Ambulatory Visit: Payer: Self-pay | Admitting: Cardiology

## 2022-09-14 DIAGNOSIS — Z23 Encounter for immunization: Secondary | ICD-10-CM | POA: Diagnosis not present

## 2022-09-16 DIAGNOSIS — Z1231 Encounter for screening mammogram for malignant neoplasm of breast: Secondary | ICD-10-CM | POA: Diagnosis not present

## 2022-09-16 DIAGNOSIS — Z01419 Encounter for gynecological examination (general) (routine) without abnormal findings: Secondary | ICD-10-CM | POA: Diagnosis not present

## 2022-09-16 DIAGNOSIS — Z6841 Body Mass Index (BMI) 40.0 and over, adult: Secondary | ICD-10-CM | POA: Diagnosis not present

## 2022-11-10 DIAGNOSIS — I10 Essential (primary) hypertension: Secondary | ICD-10-CM | POA: Diagnosis not present

## 2022-11-10 DIAGNOSIS — E039 Hypothyroidism, unspecified: Secondary | ICD-10-CM | POA: Diagnosis not present

## 2022-11-10 DIAGNOSIS — Z79899 Other long term (current) drug therapy: Secondary | ICD-10-CM | POA: Diagnosis not present

## 2022-11-10 DIAGNOSIS — E7800 Pure hypercholesterolemia, unspecified: Secondary | ICD-10-CM | POA: Diagnosis not present

## 2022-11-10 DIAGNOSIS — E1165 Type 2 diabetes mellitus with hyperglycemia: Secondary | ICD-10-CM | POA: Diagnosis not present

## 2022-11-10 DIAGNOSIS — E559 Vitamin D deficiency, unspecified: Secondary | ICD-10-CM | POA: Diagnosis not present

## 2022-11-10 DIAGNOSIS — E1169 Type 2 diabetes mellitus with other specified complication: Secondary | ICD-10-CM | POA: Diagnosis not present

## 2022-12-11 DIAGNOSIS — E039 Hypothyroidism, unspecified: Secondary | ICD-10-CM | POA: Diagnosis not present

## 2022-12-11 DIAGNOSIS — Z79899 Other long term (current) drug therapy: Secondary | ICD-10-CM | POA: Diagnosis not present

## 2022-12-11 DIAGNOSIS — E559 Vitamin D deficiency, unspecified: Secondary | ICD-10-CM | POA: Diagnosis not present

## 2022-12-11 DIAGNOSIS — E7800 Pure hypercholesterolemia, unspecified: Secondary | ICD-10-CM | POA: Diagnosis not present

## 2022-12-11 DIAGNOSIS — I10 Essential (primary) hypertension: Secondary | ICD-10-CM | POA: Diagnosis not present

## 2022-12-11 DIAGNOSIS — E1169 Type 2 diabetes mellitus with other specified complication: Secondary | ICD-10-CM | POA: Diagnosis not present

## 2022-12-17 DIAGNOSIS — E559 Vitamin D deficiency, unspecified: Secondary | ICD-10-CM | POA: Diagnosis not present

## 2022-12-17 DIAGNOSIS — I700 Atherosclerosis of aorta: Secondary | ICD-10-CM | POA: Diagnosis not present

## 2022-12-17 DIAGNOSIS — I10 Essential (primary) hypertension: Secondary | ICD-10-CM | POA: Diagnosis not present

## 2022-12-17 DIAGNOSIS — E1165 Type 2 diabetes mellitus with hyperglycemia: Secondary | ICD-10-CM | POA: Diagnosis not present

## 2022-12-17 DIAGNOSIS — E7800 Pure hypercholesterolemia, unspecified: Secondary | ICD-10-CM | POA: Diagnosis not present

## 2022-12-17 DIAGNOSIS — E039 Hypothyroidism, unspecified: Secondary | ICD-10-CM | POA: Diagnosis not present

## 2022-12-18 ENCOUNTER — Ambulatory Visit: Payer: Medicare Other | Admitting: Cardiology

## 2022-12-23 NOTE — Progress Notes (Signed)
Cardiology Clinic Note   Patient Name: Sandy Daniels Date of Encounter: 12/25/2022  Primary Care Provider:  Deland Pretty, MD Primary Cardiologist:  Glenetta Hew, MD  Patient Profile    Sandy Daniels 72 year old female presents to the clinic today for essential hypertension, sinus tachycardia, and lower extremity edema.  Past Medical History    Past Medical History:  Diagnosis Date   Anxiety    Diabetes mellitus without complication ([place])    Diverticulitis large intestine 01/2013   GERD (gastroesophageal reflux disease)    Goiter    History of Helicobacter pylori infection 05/25/2003   gastritis at EGD   Hyperlipidemia    Hypertension    Hypothyroidism    On thyroid supplementation   Morbid obesity with BMI of 45.0-49.9, adult (HCC)    OA (osteoarthritis)    Personal history of colonic adenomas 06/12/2013   Seborrheic dermatitis    Tendonitis of ankle    Vitamin D deficiency    Past Surgical History:  Procedure Laterality Date   COLONOSCOPY  03/15/2002, 01/22/2017   internal hemorrhoids, Dr. Jim Desanctis   ESOPHAGOGASTRODUODENOSCOPY  05/25/2003   H. Pylori, Dr. Jim Desanctis   NM MYOVIEW LTD  10/08/2011   LOW RISK study. EF 68%. Breast attenuation artifact noted otherwise normal study.   POLYPECTOMY     TONSILLECTOMY     TRANSTHORACIC ECHOCARDIOGRAM  10/08/2011   Normal study. No valve lesions. Normal LV function, EF >55%, GR 1 DD. No valve lesions   UPPER GASTROINTESTINAL ENDOSCOPY     VAGINAL HYSTERECTOMY      Allergies  Allergies  Allergen Reactions   Betadine [Povidone Iodine] Rash    Severe bumps and blistering   Neosporin [Neomycin-Bacitracin Zn-Polymyx] Rash    blistering   Nexium [Esomeprazole Magnesium] Other (See Comments)    Headache   Penicillins Hives and Swelling    Did it involve swelling of the face/tongue/throat, SOB, or low BP? No Did it involve sudden or severe rash/hives, skin peeling, or any reaction on the inside of your mouth or nose?  No Did you need to seek medical attention at a hospital or doctor's office? No When did it last happen?       If all above answers are "NO", may proceed with cephalosporin use.     History of Present Illness    Sandy Daniels has a PMH of type 2 diabetes, metabolic syndrome, bilateral lower extremity edema, essential hypertension, and sinus tachycardia.  She was seen by Dr. Ellyn Hack 11/24/2019.  She had no significant cardiac symptoms at that time.  She did note some nagging off and on chest discomfort which was apparent at rest.  She was very anxious.  She reported increased stress at home.  She felt that her anxiety and stress were driving her tachycardia.  She was seen by Dr. Ellyn Hack 12/16/2020.  During that time she continued to do well from a cardiac standpoint.  She reported that she had been helping to care for her 50 year old her cousin's 66 year old daughter.  She was having a hard time coping to evaluate 2 properties.  She was doing her best to sleep at her house at night.  She continued to work on her diet and lost around 30 pounds.  Her heart rate was less labile.  She denied chest discomfort, chest pressure.  She was limited in her physical activity due to musculoskeletal pain and dyspnea.  She presented to the clinic 12/22/21 for follow-up evaluation and stated she felt  well.  She reported that she  lost around 60 pounds.  She noted that her blood pressure at home was in the 100 teens-120s over 60s-70s.  She continued to work to settle her aunt's estate. She volunteered with [employer] and other volunteer organizations.  She had been walking around 2 miles per day. Follow-up was planned for 12 months.  She presents to the clinic today for follow-up evaluation and states she is feeling well and is really happy. She has been able to settle her Aunts estate. She continues to be active with [employer] and the [facility] walk. She has been walking regularly. Her blood pressure has been well controlled  at home. In the clinic today it is initially 155/ 80 and on recheck is 128/78. I will request her labs from her PCP, have her continue he physical activity, and plan follow up in 12 months.  Today she denies chest pain, shortness of breath, lower extremity edema, fatigue, palpitations, melena, hematuria, hemoptysis, diaphoresis, weakness, presyncope, syncope, orthopnea, and PND.     Home Medications    Prior to Admission medications   Medication Sig Start Date End Date Taking? Authorizing Provider  calcium carbonate (TUMS - DOSED IN MG ELEMENTAL CALCIUM) 500 MG chewable tablet Chew 1 tablet by mouth daily.    [provider]  dapagliflozin propanediol (FARXIGA) 10 MG TABS tablet Farxiga 10 mg tablet  Take 1 tablet every day by oral route.    [provider]  levothyroxine (SYNTHROID, LEVOTHROID) 100 MCG tablet Take 100 mcg by mouth daily before breakfast.    [provider]  linaclotide (LINZESS) 290 MCG CAPS capsule Take 1 capsule (290 mcg total) by mouth daily. 01/26/20   Gatha Mayer, MD  metoprolol succinate (TOPROL-XL) 50 MG 24 hr tablet Take 1 tablet (50 mg total) by mouth at bedtime. Take with or immediately following a meal.  Keep upcoming appointment for future refills. 12/08/21 03/08/22  Leonie Man, MD  mupirocin ointment (BACTROBAN) 2 % Apply 1 application topically 2 (two) times daily. To affected area till better 12/11/21   Barrett Henle, MD  rosuvastatin (CRESTOR) 10 MG tablet Take 10 mg by mouth daily. 01/14/17   [provider]  Semaglutide (OZEMPIC, 1 MG/DOSE, [place]) Inject 1 mg into the skin daily.    [provider]  triamterene-hydrochlorothiazide (DYAZIDE) 37.5-25 MG capsule Take 1 capsule by mouth every morning. 06/30/17   [provider]    Family History    Family History  Problem Relation Age of Onset   Dementia Father    Diabetes Father    Heart disease Father    Hypertension Father    Allergic rhinitis  Father    Heart disease Mother    Diabetes Mother    Colon cancer Neg Hx    Rectal cancer Neg Hx    Stomach cancer Neg Hx    Esophageal cancer Neg Hx    Pancreatic cancer Neg Hx    Colon polyps Neg Hx    She indicated that her mother is deceased. She indicated that her father is deceased. She indicated that her sister is alive. She indicated that her brother is alive. She indicated that her maternal grandmother is deceased. She indicated that her maternal grandfather is deceased. She indicated that her paternal grandmother is deceased. She indicated that her paternal grandfather is deceased. She indicated that the status of her neg hx is unknown.  Social History    Social History  Socioeconomic History   Marital status: Divorced    Spouse name: Not on file   Number of children: 1   Years of education: Not on file   Highest education level: Not on file  Occupational History    Employer: [employer]  Tobacco Use   Smoking status: Former    Types: Cigarettes    Quit date: 01/03/2013    Years since quitting: 9.9   Smokeless tobacco: Never  Vaping Use   Vaping Use: Never used  Substance and Sexual Activity   Alcohol use: No   Drug use: No   Sexual activity: Not on file  Other Topics Concern   Not on file  Social History Narrative   [employer]   Divorced   1 daughter   She does significant out of volunteer work with 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   --> She has been quite distressed with the fact that she is not able to do her routine fundraising due to COVID-19 restrictions.      She is now the caregiver for her 70 year old aunt and her cousin's 103 year old granddaughter.  Essentially, both her cousin and niece (the mother of the 45 year old) have passed away over the last year plus.  Her cousin had been caring for both until she died.  This is left [place] as the only remaining family for the 2  ladies-the 73 year old and 73 year old.   Caring for the aunt is very stressful.  She has to go up and down lots of steps is doing much more housework, basically doing to the hospital for 2 houses.  She is also trying to somehow figure out how to be the caregiver of a 73 year old again-having a hard time dealing with the changes of the new generation of children.      She has made significant dietary adjustments and is increasing her exercise..   Social Determinants of Health   Financial Resource Strain: Not on file  Food Insecurity: Not on file  Transportation Needs: Not on file  Physical Activity: Not on file  Stress: Not on file  Social Connections: Not on file  Intimate Partner Violence: Not on file     Review of Systems    General:  No chills, fever, night sweats or weight changes.  Cardiovascular:  No chest pain, dyspnea on exertion, edema, orthopnea, palpitations, paroxysmal nocturnal dyspnea. Dermatological: No rash, lesions/masses Respiratory: No cough, dyspnea Urologic: No hematuria, dysuria Abdominal:   No nausea, vomiting, diarrhea, bright red blood per rectum, melena, or hematemesis Neurologic:  No visual changes, wkns, changes in mental status. All other systems reviewed and are otherwise negative except as noted above.  Physical Exam    VS:  BP 128/78   Pulse 93   Ht '5\' 6"'$  (1.676 m)   Wt 274 lb 9.6 oz (124.6 kg)   SpO2 98%   BMI 44.32 kg/m  , BMI Body mass index is 44.32 kg/m. GEN: Well nourished, well developed, in no acute distress. HEENT: normal. Neck: Supple, no JVD, carotid bruits, or masses. Cardiac: RRR, no murmurs, rubs, or gallops. No clubbing, cyanosis, bilateral lower extremity generalized nonpitting edema.  Radials/DP/PT 2+ and equal bilaterally.  Respiratory:  Respirations regular and unlabored, clear to auscultation bilaterally. GI: Soft, nontender, nondistended, BS + x 4. MS: no deformity or atrophy. Skin: warm and dry, no rash. Neuro:   Strength and sensation are intact. Psych: Normal affect.  Accessory Clinical Findings  Recent Labs: No results found for requested labs within last 365 days.   Recent Lipid Panel No results found for: "CHOL", "TRIG", "HDL", "CHOLHDL", "VLDL", "LDLCALC", "LDLDIRECT"  ECG personally reviewed by me today-normal sinus rhythm no ectopy 81 bpm.  EKG 12/22/21  normal sinus rhythm rightward axis deviation 80 bpm - No acute changes  EKG 12/16/2020 Sinus tachycardia rightward axis deviation 111 bpm  Assessment & Plan   1.  Essential hypertension-BP today 128/78.  Well-controlled at home. Continue metoprolol, triamterene, hydrochlorothiazide Heart healthy low-sodium diet-salty 6 reviewed Increase physical activity as tolerated Order BMP, CBC  Bilateral lower extremity edema- Stable generalized bilateral lower extremity nonpitting edema. Continue Farxiga Heart healthy low-sodium diet. Increase physical activity as tolerated Elevate lower extremities when not active Lower extremity support stockings recommended  Sinus tachycardia-EKG today shows normal sinus rhythm 81 bpm. Continue metoprolol Maintain hydration, avoid triggers   Metabolic syndrome, type 2 diabetes-weight today 274.6.  Tolerating Ozempic well. Continue Ozempic Maintain physical activity and current diet. Follows with PCP  Disposition: Follow-up with Dr. Ellyn Hack 12 months.  Jossie Ng. Abrial Arrighi NP-C    12/25/2022, 9:22 AM [address] Office [phone] Fax [phone]  Notice: This dictation was prepared with Dragon dictation along with smaller phrase technology. Any transcriptional errors that result from this process are unintentional and may not be corrected upon review.  I spent 14 minutes examining this patient, reviewing medications, and using patient centered shared decision making involving her cardiac care.  Prior to her visit I spent greater than 20  minutes reviewing her past medical history,  medications, and prior cardiac tests.

## 2022-12-25 ENCOUNTER — Ambulatory Visit: Payer: Medicare Other | Attending: Cardiology | Admitting: General Practice

## 2022-12-25 ENCOUNTER — Encounter: Payer: Self-pay | Admitting: General Practice

## 2022-12-25 VITALS — BP 128/78 | HR 93 | Ht 66.0 in | Wt 274.6 lb

## 2022-12-25 DIAGNOSIS — R000 Tachycardia, unspecified: Secondary | ICD-10-CM | POA: Diagnosis not present

## 2022-12-25 DIAGNOSIS — E1165 Type 2 diabetes mellitus with hyperglycemia: Secondary | ICD-10-CM | POA: Diagnosis not present

## 2022-12-25 DIAGNOSIS — R600 Localized edema: Secondary | ICD-10-CM | POA: Insufficient documentation

## 2022-12-25 DIAGNOSIS — I10 Essential (primary) hypertension: Secondary | ICD-10-CM | POA: Insufficient documentation

## 2022-12-25 NOTE — Patient Instructions (Signed)
Medication Instructions:  The current medical regimen is effective;  continue present plan and medications as directed. Please refer to the Current Medication list given to you today.  *If you need a refill on your cardiac medications before your next appointment, please call your pharmacy*  Lab Work: [place] If you have labs (blood work) drawn today and your tests are completely normal, you will receive your results only by: [place] (if you have MyChart) O  A paper copy in the mail If you have any lab test that is abnormal or we need to change your treatment, we will call you to review the results.  Testing/Procedures: NONE  Follow-Up: At [facility] you and your health needs are our priority.  As part of our continuing mission to provide you with exceptional heart care, we have created designated Provider Care Teams.  These Care Teams include your primary Cardiologist (physician) and Advanced Practice Providers (APPs -  Physician Assistants and Nurse Practitioners) who all work together to provide you with the care you need, when you need it.  We recommend signing up for the patient portal called "MyChart".  Sign up information is provided on this After Visit Summary.  MyChart is used to connect with patients for Virtual Visits (Telemedicine).  Patients are able to view lab/test results, encounter notes, upcoming appointments, etc.  Non-urgent messages can be sent to your provider as well.   To learn more about what you can do with MyChart, go to [email or web address].    Your next appointment:   12 month(s)  Provider:   Glenetta Hew, MD     Other Instructions CONTINUE PHYSICAL ACTIVITY  PLEASE READ AND FOLLOW ATTACHED  SALTY 6

## 2023-01-15 ENCOUNTER — Other Ambulatory Visit: Payer: Self-pay | Admitting: Internal Medicine

## 2023-01-15 ENCOUNTER — Other Ambulatory Visit: Payer: Self-pay | Admitting: Cardiology

## 2023-02-24 DIAGNOSIS — E039 Hypothyroidism, unspecified: Secondary | ICD-10-CM | POA: Diagnosis not present

## 2023-02-24 DIAGNOSIS — E7800 Pure hypercholesterolemia, unspecified: Secondary | ICD-10-CM | POA: Diagnosis not present

## 2023-02-24 DIAGNOSIS — E559 Vitamin D deficiency, unspecified: Secondary | ICD-10-CM | POA: Diagnosis not present

## 2023-02-24 DIAGNOSIS — J302 Other seasonal allergic rhinitis: Secondary | ICD-10-CM | POA: Diagnosis not present

## 2023-02-24 DIAGNOSIS — E1169 Type 2 diabetes mellitus with other specified complication: Secondary | ICD-10-CM | POA: Diagnosis not present

## 2023-02-24 DIAGNOSIS — I10 Essential (primary) hypertension: Secondary | ICD-10-CM | POA: Diagnosis not present

## 2023-03-09 DIAGNOSIS — E1169 Type 2 diabetes mellitus with other specified complication: Secondary | ICD-10-CM | POA: Diagnosis not present

## 2023-03-09 DIAGNOSIS — E7800 Pure hypercholesterolemia, unspecified: Secondary | ICD-10-CM | POA: Diagnosis not present

## 2023-03-09 DIAGNOSIS — I10 Essential (primary) hypertension: Secondary | ICD-10-CM | POA: Diagnosis not present

## 2023-03-09 DIAGNOSIS — E039 Hypothyroidism, unspecified: Secondary | ICD-10-CM | POA: Diagnosis not present

## 2023-03-09 DIAGNOSIS — J302 Other seasonal allergic rhinitis: Secondary | ICD-10-CM | POA: Diagnosis not present

## 2023-03-09 DIAGNOSIS — E559 Vitamin D deficiency, unspecified: Secondary | ICD-10-CM | POA: Diagnosis not present

## 2023-03-17 DIAGNOSIS — M7531 Calcific tendinitis of right shoulder: Secondary | ICD-10-CM | POA: Diagnosis not present

## 2023-04-01 DIAGNOSIS — I10 Essential (primary) hypertension: Secondary | ICD-10-CM | POA: Diagnosis not present

## 2023-04-01 DIAGNOSIS — E1169 Type 2 diabetes mellitus with other specified complication: Secondary | ICD-10-CM | POA: Diagnosis not present

## 2023-04-02 LAB — LAB REPORT - SCANNED
Albumin, Urine POC: 34.9
Creatinine, POC: 113.1 mg/dL
EGFR: 62
Microalb Creat Ratio: 31

## 2023-04-06 DIAGNOSIS — E559 Vitamin D deficiency, unspecified: Secondary | ICD-10-CM | POA: Diagnosis not present

## 2023-04-06 DIAGNOSIS — E7800 Pure hypercholesterolemia, unspecified: Secondary | ICD-10-CM | POA: Diagnosis not present

## 2023-04-06 DIAGNOSIS — E1169 Type 2 diabetes mellitus with other specified complication: Secondary | ICD-10-CM | POA: Diagnosis not present

## 2023-04-06 DIAGNOSIS — I10 Essential (primary) hypertension: Secondary | ICD-10-CM | POA: Diagnosis not present

## 2023-04-06 DIAGNOSIS — E039 Hypothyroidism, unspecified: Secondary | ICD-10-CM | POA: Diagnosis not present

## 2023-04-28 DIAGNOSIS — M7531 Calcific tendinitis of right shoulder: Secondary | ICD-10-CM | POA: Diagnosis not present

## 2023-05-22 NOTE — Progress Notes (Signed)
Labs scanned 04/01/2023 *04/01/2023: Na+ 142, K+ 4.4, Cl- 106, HCO3-22, BUN 11, Cr 0.97, Glu 4 9, Ca2+ 9.9; AST 11, ALT 12, AlkP 7 1 * 08/10/2022: A1c 7.0 * 12/11/2022 TC 99, TG 85, HDL 49, LDL 33; TSH 0.67  Lipids look great.  Bryan Lemma, MD

## 2023-06-04 DIAGNOSIS — E119 Type 2 diabetes mellitus without complications: Secondary | ICD-10-CM | POA: Diagnosis not present

## 2023-06-04 DIAGNOSIS — H2513 Age-related nuclear cataract, bilateral: Secondary | ICD-10-CM | POA: Diagnosis not present

## 2023-07-06 DIAGNOSIS — M7531 Calcific tendinitis of right shoulder: Secondary | ICD-10-CM | POA: Diagnosis not present

## 2023-07-06 DIAGNOSIS — M25511 Pain in right shoulder: Secondary | ICD-10-CM | POA: Diagnosis not present

## 2023-07-19 DIAGNOSIS — E7800 Pure hypercholesterolemia, unspecified: Secondary | ICD-10-CM | POA: Diagnosis not present

## 2023-07-19 DIAGNOSIS — E559 Vitamin D deficiency, unspecified: Secondary | ICD-10-CM | POA: Diagnosis not present

## 2023-07-19 DIAGNOSIS — I10 Essential (primary) hypertension: Secondary | ICD-10-CM | POA: Diagnosis not present

## 2023-07-19 DIAGNOSIS — E118 Type 2 diabetes mellitus with unspecified complications: Secondary | ICD-10-CM | POA: Diagnosis not present

## 2023-07-19 DIAGNOSIS — E039 Hypothyroidism, unspecified: Secondary | ICD-10-CM | POA: Diagnosis not present

## 2023-07-20 LAB — LAB REPORT - SCANNED
A1c: 6.9
Albumin, Urine POC: 47.9
Creatinine, Urine.: 111.4
EGFR: 62
Microalb Creat Ratio: 43

## 2023-07-21 DIAGNOSIS — M25511 Pain in right shoulder: Secondary | ICD-10-CM | POA: Diagnosis not present

## 2023-07-27 DIAGNOSIS — E039 Hypothyroidism, unspecified: Secondary | ICD-10-CM | POA: Diagnosis not present

## 2023-07-27 DIAGNOSIS — I10 Essential (primary) hypertension: Secondary | ICD-10-CM | POA: Diagnosis not present

## 2023-07-27 DIAGNOSIS — E7800 Pure hypercholesterolemia, unspecified: Secondary | ICD-10-CM | POA: Diagnosis not present

## 2023-07-27 DIAGNOSIS — D72829 Elevated white blood cell count, unspecified: Secondary | ICD-10-CM | POA: Diagnosis not present

## 2023-07-27 DIAGNOSIS — E559 Vitamin D deficiency, unspecified: Secondary | ICD-10-CM | POA: Diagnosis not present

## 2023-07-27 DIAGNOSIS — Z23 Encounter for immunization: Secondary | ICD-10-CM | POA: Diagnosis not present

## 2023-07-27 DIAGNOSIS — E1169 Type 2 diabetes mellitus with other specified complication: Secondary | ICD-10-CM | POA: Diagnosis not present

## 2023-08-13 DIAGNOSIS — M25511 Pain in right shoulder: Secondary | ICD-10-CM | POA: Diagnosis not present

## 2023-08-16 DIAGNOSIS — I10 Essential (primary) hypertension: Secondary | ICD-10-CM | POA: Diagnosis not present

## 2023-08-16 DIAGNOSIS — E1169 Type 2 diabetes mellitus with other specified complication: Secondary | ICD-10-CM | POA: Diagnosis not present

## 2023-08-24 DIAGNOSIS — E039 Hypothyroidism, unspecified: Secondary | ICD-10-CM | POA: Diagnosis not present

## 2023-08-24 DIAGNOSIS — I10 Essential (primary) hypertension: Secondary | ICD-10-CM | POA: Diagnosis not present

## 2023-08-24 DIAGNOSIS — E559 Vitamin D deficiency, unspecified: Secondary | ICD-10-CM | POA: Diagnosis not present

## 2023-08-24 DIAGNOSIS — Z23 Encounter for immunization: Secondary | ICD-10-CM | POA: Diagnosis not present

## 2023-08-24 DIAGNOSIS — Z0000 Encounter for general adult medical examination without abnormal findings: Secondary | ICD-10-CM | POA: Diagnosis not present

## 2023-08-24 DIAGNOSIS — E7800 Pure hypercholesterolemia, unspecified: Secondary | ICD-10-CM | POA: Diagnosis not present

## 2023-08-24 DIAGNOSIS — I700 Atherosclerosis of aorta: Secondary | ICD-10-CM | POA: Diagnosis not present

## 2023-08-24 DIAGNOSIS — E1165 Type 2 diabetes mellitus with hyperglycemia: Secondary | ICD-10-CM | POA: Diagnosis not present

## 2023-09-06 DIAGNOSIS — M25511 Pain in right shoulder: Secondary | ICD-10-CM | POA: Diagnosis not present

## 2023-09-07 DIAGNOSIS — H18413 Arcus senilis, bilateral: Secondary | ICD-10-CM | POA: Diagnosis not present

## 2023-09-07 DIAGNOSIS — H2511 Age-related nuclear cataract, right eye: Secondary | ICD-10-CM | POA: Diagnosis not present

## 2023-09-07 DIAGNOSIS — H25043 Posterior subcapsular polar age-related cataract, bilateral: Secondary | ICD-10-CM | POA: Diagnosis not present

## 2023-09-07 DIAGNOSIS — H25013 Cortical age-related cataract, bilateral: Secondary | ICD-10-CM | POA: Diagnosis not present

## 2023-09-07 DIAGNOSIS — H2513 Age-related nuclear cataract, bilateral: Secondary | ICD-10-CM | POA: Diagnosis not present

## 2023-11-05 DIAGNOSIS — H25811 Combined forms of age-related cataract, right eye: Secondary | ICD-10-CM | POA: Diagnosis not present

## 2023-11-05 DIAGNOSIS — H2512 Age-related nuclear cataract, left eye: Secondary | ICD-10-CM | POA: Diagnosis not present

## 2023-11-05 DIAGNOSIS — H2511 Age-related nuclear cataract, right eye: Secondary | ICD-10-CM | POA: Diagnosis not present

## 2023-11-30 DIAGNOSIS — E1169 Type 2 diabetes mellitus with other specified complication: Secondary | ICD-10-CM | POA: Diagnosis not present

## 2023-11-30 DIAGNOSIS — E7800 Pure hypercholesterolemia, unspecified: Secondary | ICD-10-CM | POA: Diagnosis not present

## 2023-11-30 DIAGNOSIS — I10 Essential (primary) hypertension: Secondary | ICD-10-CM | POA: Diagnosis not present

## 2023-11-30 DIAGNOSIS — E039 Hypothyroidism, unspecified: Secondary | ICD-10-CM | POA: Diagnosis not present

## 2023-11-30 DIAGNOSIS — E559 Vitamin D deficiency, unspecified: Secondary | ICD-10-CM | POA: Diagnosis not present

## 2023-12-03 DIAGNOSIS — H25812 Combined forms of age-related cataract, left eye: Secondary | ICD-10-CM | POA: Diagnosis not present

## 2023-12-03 DIAGNOSIS — H2512 Age-related nuclear cataract, left eye: Secondary | ICD-10-CM | POA: Diagnosis not present

## 2023-12-13 DIAGNOSIS — Z1231 Encounter for screening mammogram for malignant neoplasm of breast: Secondary | ICD-10-CM | POA: Diagnosis not present

## 2023-12-13 DIAGNOSIS — Z01419 Encounter for gynecological examination (general) (routine) without abnormal findings: Secondary | ICD-10-CM | POA: Diagnosis not present

## 2023-12-13 DIAGNOSIS — Z6841 Body Mass Index (BMI) 40.0 and over, adult: Secondary | ICD-10-CM | POA: Diagnosis not present

## 2023-12-13 DIAGNOSIS — N952 Postmenopausal atrophic vaginitis: Secondary | ICD-10-CM | POA: Diagnosis not present

## 2023-12-30 ENCOUNTER — Telehealth: Payer: Self-pay | Admitting: Internal Medicine

## 2023-12-30 ENCOUNTER — Other Ambulatory Visit: Payer: Self-pay

## 2023-12-30 MED ORDER — METRONIDAZOLE 250 MG PO TABS: 250.0000 mg | ORAL_TABLET | Freq: Three times a day (TID) | ORAL | 0 refills | Status: DC

## 2023-12-30 MED ORDER — CIPROFLOXACIN HCL 500 MG PO TABS
500.0000 mg | ORAL_TABLET | Freq: Two times a day (BID) | ORAL | 0 refills | Status: DC
Start: 2023-12-30 — End: 2024-03-13

## 2023-12-30 NOTE — Telephone Encounter (Signed)
 Cipro 500 mg BID and metronidazole 250 mg TID x 7 days for diverticulitis JMP

## 2023-12-30 NOTE — Progress Notes (Signed)
 Marland Kitchen

## 2023-12-30 NOTE — Telephone Encounter (Signed)
 Prescriptions sent to pharmacy, pt aware

## 2023-12-30 NOTE — Telephone Encounter (Signed)
 Inbound call from patient, states she is having lower abdominal pain. States she would like Dr. Rhea Belton to call a medication in.

## 2023-12-30 NOTE — Telephone Encounter (Signed)
 [facility]ner pt with hx of diverticulitis. See note below, pt requesting antibiotics. As dod please advise.

## 2023-12-30 NOTE — Telephone Encounter (Signed)
 Pt states she started having pain in the bottom of her stomach last night. States as the day has gone by the pain is getting a little stronger. She states this is what happens when she has a diverticulitis flare. She states she has no fever. Requests medication be sent to her pharmacy. Please advise.

## 2024-01-03 ENCOUNTER — Ambulatory Visit: Payer: Medicare Other | Admitting: Cardiology

## 2024-01-05 DIAGNOSIS — R82998 Other abnormal findings in urine: Secondary | ICD-10-CM | POA: Diagnosis not present

## 2024-02-09 DIAGNOSIS — E119 Type 2 diabetes mellitus without complications: Secondary | ICD-10-CM | POA: Diagnosis not present

## 2024-02-09 DIAGNOSIS — L600 Ingrowing nail: Secondary | ICD-10-CM | POA: Diagnosis not present

## 2024-02-09 DIAGNOSIS — M792 Neuralgia and neuritis, unspecified: Secondary | ICD-10-CM | POA: Diagnosis not present

## 2024-02-09 DIAGNOSIS — R600 Localized edema: Secondary | ICD-10-CM | POA: Diagnosis not present

## 2024-02-15 DIAGNOSIS — E7800 Pure hypercholesterolemia, unspecified: Secondary | ICD-10-CM | POA: Diagnosis not present

## 2024-02-15 DIAGNOSIS — E039 Hypothyroidism, unspecified: Secondary | ICD-10-CM | POA: Diagnosis not present

## 2024-02-15 DIAGNOSIS — I10 Essential (primary) hypertension: Secondary | ICD-10-CM | POA: Diagnosis not present

## 2024-02-15 DIAGNOSIS — E1169 Type 2 diabetes mellitus with other specified complication: Secondary | ICD-10-CM | POA: Diagnosis not present

## 2024-02-15 DIAGNOSIS — E559 Vitamin D deficiency, unspecified: Secondary | ICD-10-CM | POA: Diagnosis not present

## 2024-02-22 DIAGNOSIS — E039 Hypothyroidism, unspecified: Secondary | ICD-10-CM | POA: Diagnosis not present

## 2024-02-22 DIAGNOSIS — E559 Vitamin D deficiency, unspecified: Secondary | ICD-10-CM | POA: Diagnosis not present

## 2024-02-22 DIAGNOSIS — I10 Essential (primary) hypertension: Secondary | ICD-10-CM | POA: Diagnosis not present

## 2024-02-22 DIAGNOSIS — E1165 Type 2 diabetes mellitus with hyperglycemia: Secondary | ICD-10-CM | POA: Diagnosis not present

## 2024-02-22 DIAGNOSIS — E7800 Pure hypercholesterolemia, unspecified: Secondary | ICD-10-CM | POA: Diagnosis not present

## 2024-03-06 ENCOUNTER — Encounter (HOSPITAL_BASED_OUTPATIENT_CLINIC_OR_DEPARTMENT_OTHER): Payer: Self-pay

## 2024-03-07 ENCOUNTER — Encounter: Payer: Self-pay | Admitting: Cardiology

## 2024-03-07 ENCOUNTER — Ambulatory Visit: Payer: Medicare Other | Attending: Cardiology | Admitting: Cardiology

## 2024-03-07 VITALS — BP 132/76 | HR 87 | Ht 66.0 in | Wt 273.6 lb

## 2024-03-07 DIAGNOSIS — E1169 Type 2 diabetes mellitus with other specified complication: Secondary | ICD-10-CM

## 2024-03-07 DIAGNOSIS — E8881 Metabolic syndrome: Secondary | ICD-10-CM

## 2024-03-07 DIAGNOSIS — R600 Localized edema: Secondary | ICD-10-CM | POA: Diagnosis not present

## 2024-03-07 DIAGNOSIS — E785 Hyperlipidemia, unspecified: Secondary | ICD-10-CM

## 2024-03-07 DIAGNOSIS — I10 Essential (primary) hypertension: Secondary | ICD-10-CM | POA: Diagnosis not present

## 2024-03-07 MED ORDER — FAMOTIDINE 40 MG PO TABS: 40.0000 mg | ORAL_TABLET | ORAL | 0 refills | Status: AC | PRN

## 2024-03-07 NOTE — Progress Notes (Signed)
 Cardiology Office Note:  .   Date:  03/13/2024  ID:  Sandy Daniels, DOB 10/06/1951, MRN [id number] PCP: Sandy Man, MD  Sandy Daniels Cardiologist:  Sandy Bustard, MD     Chief Complaint  Patient presents with   Follow-up    Annual follow-up.  Doing well.  No angina or heart failure.    Patient Profile: .     Sandy Daniels is a morbidly obese 73 y.o. female with a PMH notable for factors of metabolic syndrome (obesity, DM-2, HTN and HLD) with chronic lower extremity edema and sinus tachycardia who presents here for delayed annual follow-up at the request of Sandy Man, MD.    Sandy Daniels was last seen in February 2024 by Sandy Pray, NP as a 1-year follow-up (second consecutive visit with APP).  As a February 2023 she had lost 60 pounds.  BP was controlled.  Still volunteering with [facility].  Walking maybe 2 miles a day.  As of 2024 she was again doing well.  Very happy.  She had settled her aunt's estate after her passing.  She was still active with [facility] and the heart walk.  BP controlled but was elevated in the clinic.  On recheck was better.  Labs followed by PCP.  Plan for 1 annual follow-up.  Subjective  Discussed the use of AI scribe software for clinical note transcription with the patient, who gave verbal consent to proceed.  History of Present Illness History of Present Illness Sandy Daniels is a 73 year old female with hypertension, hyperlipidemia, and type 2 diabetes who presents for a follow-up visit.  She has a history of hypertension, currently managed with amlodipine 5 mg, carvedilol 6.25 mg twice daily, and olmesartan 40 mg daily. Her blood pressure is well-controlled with this regimen. She was previously on losartan and Toprol , which were replaced by her current medications.  She has hyperlipidemia and is taking Crestor 10 mg daily. Recent lab results from February 15, 2024, show a total cholesterol of 115 mg/dL, triglycerides of 99 mg/dL, HDL  of 50 mg/dL, and LDL of 46 mg/dL.  She has type 2 diabetes and is on Ozempic 1 mg and Farxiga 10 mg daily. She was previously on metformin but is no longer taking it. Her A1c is 6.8%, and she is working on reducing it further. She reports weight loss with Ozempic but notes a weight gain since 2023.  She experiences occasional palpitations but reports improvement with her current medication regimen. She takes carvedilol for blood pressure and heart rate control and has not needed to take extra doses.  She has arthritis in her lower back, which causes significant pain, especially when active. She does not use a brace.  She reports occasional acid reflux, particularly after eating, and finds some relief with Pepcid . She experiences gas that sometimes causes discomfort, which she initially mistook for heart issues.  She underwent cataract surgery, which improved her distance vision but affected her near vision, requiring reading glasses.  No chest pain, pressure, tightness, shortness of breath, dizziness, or swelling in her legs. No sleep apnea.  No PND, or orthopnea.  No rapid or irregular heartbeats/palpitations.  No syncope or near-syncope.  No TIA or amaurosis fugax.  No claudication.     Objective   Current Meds  Medication Sig   amLODipine (NORVASC) 5 MG tablet Take 5 mg by mouth daily.   carvedilol (COREG) 6.25 MG tablet Take 6.25 mg by mouth 2 (two)  times daily.   dapagliflozin propanediol (FARXIGA) 10 MG TABS tablet Farxiga 10 mg tablet:  Take 1 tablet every day by oral route.   levothyroxine  (SYNTHROID , LEVOTHROID) 100 MCG tablet Take 100 mcg by mouth daily before breakfast.   olmesartan (BENICAR) 40 MG tablet Take 40 mg by mouth daily.   rosuvastatin (CRESTOR) 10 MG tablet Take 10 mg by mouth daily.   Semaglutide (OZEMPIC, 1 MG/DOSE, Sandy Daniels) Inject 1 mg into the skin daily.     Studies Reviewed: Sandy Daniels   EKG Interpretation Date/Time:  Tuesday Mar 07 2024 08:28:08 EDT Ventricular Rate:   80 PR Interval:  158 QRS Duration:  84 QT Interval:  378 QTC Calculation: 435 R Axis:   74  Text Interpretation: Normal sinus rhythm Normal ECG When compared with ECG of 07-Apr-2015 11:10, Confirmed by Sandy Daniels ([id number]) on 03/07/2024 8:45:32 AM    No new studies  No results found for: "CHOL", "HDL", "LDLCALC", "LDLDIRECT", "TRIG", "CHOLHDL" Labs from PCP ([facility]) 02/15/2024: TC 115, TG 99, HDL 50, LDL 46; A1c 6.8. Cr 0.91.  TSH 0.62.  Risk Assessment/Calculations:         Physical Exam:   VS:  BP 132/76   Pulse 87   Ht 5\' 6"  (1.676 m)   Wt 273 lb 9.6 oz (124.1 kg)   SpO2 96%   BMI 44.16 kg/m    Wt Readings from Last 3 Encounters:  03/07/24 273 lb 9.6 oz (124.1 kg)  12/25/22 274 lb 9.6 oz (124.6 kg)  12/22/21 266 lb 3.2 oz (120.7 kg)    GEN: Well nourished, well groomed in no acute distress; morbidly obese, but otherwise healthy-appearing NECK: No JVD; No carotid bruits CARDIAC: Normal S1, S2; RRR, no murmurs, rubs, gallops RESPIRATORY:  Clear to auscultation without rales, wheezing or rhonchi ; nonlabored, good air movement. ABDOMEN: Soft, non-tender, non-distended EXTREMITIES:  No edema; No deformity     ASSESSMENT AND PLAN: .    Problem List Items Addressed This Visit       Cardiology Problems   Essential hypertension (Chronic)   Blood pressure well-controlled with amlodipine 5 mg daily, carvedilol 6.25 mg twice daily, and olmesartan 40 mg daily.  Interestingly, not yet on diuretic. -For now continue current meds.      Relevant Medications   amLODipine (NORVASC) 5 MG tablet   carvedilol (COREG) 6.25 MG tablet   olmesartan (BENICAR) 40 MG tablet   Other Relevant Orders   EKG 12-Lead (Completed)   Hyperlipidemia associated with type 2 diabetes mellitus (HCC) (Chronic)   Cholesterol well-managed with Crestor. Total cholesterol 115, triglycerides 99, HDL 50, LDL 46. A1c at 6.8, above target. On Ozempic and Farxiga. Weight stable compared to last year. -  Encouraged continued weight loss for glycemic control.      Relevant Medications   amLODipine (NORVASC) 5 MG tablet   carvedilol (COREG) 6.25 MG tablet   olmesartan (BENICAR) 40 MG tablet     Other   Bilateral edema of lower extremity (Chronic)   Fairly stable.  She had been on Dyazide but is no longer on a diuretic.  Continue to monitor.  For now no need for diuretic would simply recommend foot elevation.      Metabolic syndrome - Primary (Chronic)   Respecters pretty well-controlled.  Obesity still present.  Triglycerides are improved and blood pressure controlled.  A1c borderline.  Continue concentrate on weight loss. => Diet and exercise. Continuing semaglutide  Follow-Up: Return in about 1 year (around 03/07/2025) for [employer].    Signed, Arleen Lacer, MD, MS Sandy Daniels, M.D., M.S. Interventional Chartered certified accountant  Pager # [phone]

## 2024-03-07 NOTE — Progress Notes (Signed)
 EKG

## 2024-03-07 NOTE — Patient Instructions (Signed)
 Medication Instructions:   Pepcid 40 m g  take as needed *If you need a refill on your cardiac medications before your next appointment, please call your pharmacy*   Lab Work: No changes If you have labs (blood work) drawn today and your tests are completely normal, you will receive your results only by: MyChart Message (if you have MyChart) OR A paper copy in the mail If you have any lab test that is abnormal or we need to change your treatment, we will call you to review the results.   Testing/Procedures:  Not needed  Follow-Up: At [facility], you and your health needs are our priority.  As part of our continuing mission to provide you with exceptional heart care, we have created designated Provider Care Teams.  These Care Teams include your primary Cardiologist (physician) and Advanced Practice Providers (APPs -  Physician Assistants and Nurse Practitioners) who all work together to provide you with the care you need, when you need it.     Your next appointment:   12 month(s)  The format for your next appointment:   In Person  Provider:   Randene Bustard, MD or Lawana Pray, NP

## 2024-03-13 ENCOUNTER — Encounter: Payer: Self-pay | Admitting: Cardiology

## 2024-03-13 NOTE — Assessment & Plan Note (Signed)
 Cholesterol well-managed with Crestor. Total cholesterol 115, triglycerides 99, HDL 50, LDL 46. A1c at 6.8, above target. On Ozempic and Farxiga. Weight stable compared to last year. - Encouraged continued weight loss for glycemic control.

## 2024-03-13 NOTE — Assessment & Plan Note (Signed)
 Fairly stable.  She had been on Dyazide but is no longer on a diuretic.  Continue to monitor.  For now no need for diuretic would simply recommend foot elevation.

## 2024-03-13 NOTE — Assessment & Plan Note (Addendum)
 Respecters pretty well-controlled.  Obesity still present.  Triglycerides are improved and blood pressure controlled.  A1c borderline.  Continue concentrate on weight loss. => Diet and exercise. Continuing semaglutide

## 2024-03-13 NOTE — Assessment & Plan Note (Signed)
 Blood pressure well-controlled with amlodipine 5 mg daily, carvedilol 6.25 mg twice daily, and olmesartan 40 mg daily.  Interestingly, not yet on diuretic. -For now continue current meds.

## 2024-05-23 DIAGNOSIS — E039 Hypothyroidism, unspecified: Secondary | ICD-10-CM | POA: Diagnosis not present

## 2024-05-23 DIAGNOSIS — E1169 Type 2 diabetes mellitus with other specified complication: Secondary | ICD-10-CM | POA: Diagnosis not present

## 2024-05-23 DIAGNOSIS — E559 Vitamin D deficiency, unspecified: Secondary | ICD-10-CM | POA: Diagnosis not present

## 2024-05-23 DIAGNOSIS — E7800 Pure hypercholesterolemia, unspecified: Secondary | ICD-10-CM | POA: Diagnosis not present

## 2024-05-23 DIAGNOSIS — I10 Essential (primary) hypertension: Secondary | ICD-10-CM | POA: Diagnosis not present

## 2024-05-23 DIAGNOSIS — N3941 Urge incontinence: Secondary | ICD-10-CM | POA: Diagnosis not present

## 2024-06-06 ENCOUNTER — Telehealth: Payer: Self-pay | Admitting: Gastroenterology

## 2024-06-06 DIAGNOSIS — R1030 Lower abdominal pain, unspecified: Secondary | ICD-10-CM

## 2024-06-06 MED ORDER — METRONIDAZOLE 250 MG PO TABS: 250.0000 mg | ORAL_TABLET | Freq: Three times a day (TID) | ORAL | 0 refills | Status: AC

## 2024-06-06 MED ORDER — CIPROFLOXACIN HCL 500 MG PO TABS: 500.0000 mg | ORAL_TABLET | Freq: Two times a day (BID) | ORAL | 0 refills | Status: AC

## 2024-06-06 NOTE — Telephone Encounter (Signed)
 Medications sent to pharmacy. Called patient. Discussed reasons to go to ER. Including increasing pain, fevers, nausea, vomiting, or rectal bleeding. Patient verbalized understanding. Appointment scheduled for 09/16 at 0920 AM.

## 2024-06-06 NOTE — Telephone Encounter (Signed)
 Spoke with pt. She reports pain in her lower abdomen that started yesterday. States the pain will hurt and then ease up for a short period of time, and then it starts up again. Denies bloody stools. States it feels like a diverticulitis flair-up. Requesting medication be sent to pharmacy.

## 2024-06-06 NOTE — Telephone Encounter (Signed)
 Inbound call from patient stating she believes she is having a diverticulitis flare up. States she is having sharp pains in her lower abdomen. Requesting to know if she is able to have a prescription sent to her pharmacy. Please advise, thank you

## 2024-06-06 NOTE — Telephone Encounter (Signed)
 Spoke with patient, states she has not had any of the symptoms mentioned, the only symptom is the pain. Patient does voice concern that she may be constipated. She has bms, however they are irregular and she sometimes feels like she has to strain, and then reports occasional diarrhea with these stools.

## 2024-06-16 ENCOUNTER — Emergency Department (HOSPITAL_BASED_OUTPATIENT_CLINIC_OR_DEPARTMENT_OTHER)
Admission: EM | Admit: 2024-06-16 | Discharge: 2024-06-16 | Disposition: A | Discharge status: Home / Self Care | Attending: Emergency Medicine | Admitting: Emergency Medicine

## 2024-06-16 ENCOUNTER — Other Ambulatory Visit: Payer: Self-pay

## 2024-06-16 ENCOUNTER — Emergency Department (HOSPITAL_BASED_OUTPATIENT_CLINIC_OR_DEPARTMENT_OTHER)

## 2024-06-16 DIAGNOSIS — E042 Nontoxic multinodular goiter: Secondary | ICD-10-CM | POA: Insufficient documentation

## 2024-06-16 DIAGNOSIS — H539 Unspecified visual disturbance: Secondary | ICD-10-CM | POA: Diagnosis not present

## 2024-06-16 DIAGNOSIS — E039 Hypothyroidism, unspecified: Secondary | ICD-10-CM | POA: Insufficient documentation

## 2024-06-16 DIAGNOSIS — E041 Nontoxic single thyroid nodule: Secondary | ICD-10-CM | POA: Diagnosis not present

## 2024-06-16 DIAGNOSIS — Z79899 Other long term (current) drug therapy: Secondary | ICD-10-CM | POA: Insufficient documentation

## 2024-06-16 DIAGNOSIS — E119 Type 2 diabetes mellitus without complications: Secondary | ICD-10-CM | POA: Diagnosis not present

## 2024-06-16 DIAGNOSIS — I10 Essential (primary) hypertension: Secondary | ICD-10-CM | POA: Insufficient documentation

## 2024-06-16 DIAGNOSIS — R519 Headache, unspecified: Secondary | ICD-10-CM | POA: Insufficient documentation

## 2024-06-16 DIAGNOSIS — I6523 Occlusion and stenosis of bilateral carotid arteries: Secondary | ICD-10-CM | POA: Diagnosis not present

## 2024-06-16 LAB — COMPREHENSIVE METABOLIC PANEL WITH GFR
ALT: 9 U/L (ref 0–44)
AST: 23 U/L (ref 15–41)
Albumin: 3.5 g/dL (ref 3.5–5.0)
Alkaline Phosphatase: 81 U/L (ref 38–126)
Anion gap: 12 (ref 5–15)
BUN: 11 mg/dL (ref 8–23)
CO2: 21 mmol/L — ABNORMAL LOW (ref 22–32)
Calcium: 9.5 mg/dL (ref 8.9–10.3)
Chloride: 108 mmol/L (ref 98–111)
Creatinine, Ser: 0.87 mg/dL (ref 0.44–1.00)
GFR, Estimated: >60 mL/min (ref >60)
Glucose, Bld: 92 mg/dL (ref 70–99)
Potassium: 4.3 mmol/L (ref 3.5–5.1)
Sodium: 142 mmol/L (ref 135–145)
Total Bilirubin: 0.6 mg/dL (ref 0.0–1.2)
Total Protein: 6.9 g/dL (ref 6.5–8.1)

## 2024-06-16 LAB — CBC WITH DIFFERENTIAL/PLATELET
Abs Immature Granulocytes: 0.03 K/uL (ref 0.00–0.07)
Basophils Absolute: 0 K/uL (ref 0.0–0.1)
Basophils Relative: 0 %
Eosinophils Absolute: 0.2 K/uL (ref 0.0–0.5)
Eosinophils Relative: 2 %
HCT: 45.6 % (ref 36.0–46.0)
Hemoglobin: 15.3 g/dL — ABNORMAL HIGH (ref 12.0–15.0)
Immature Granulocytes: 0 %
Lymphocytes Relative: 33 %
Lymphs Abs: 3.8 K/uL (ref 0.7–4.0)
MCH: 29.5 pg (ref 26.0–34.0)
MCHC: 33.6 g/dL (ref 30.0–36.0)
MCV: 88 fL (ref 80.0–100.0)
Monocytes Absolute: 0.9 K/uL (ref 0.1–1.0)
Monocytes Relative: 8 %
Neutro Abs: 6.7 K/uL (ref 1.7–7.7)
Neutrophils Relative %: 57 %
Platelets: 189 K/uL (ref 150–400)
RBC: 5.18 MIL/uL — ABNORMAL HIGH (ref 3.87–5.11)
RDW: 15.4 % (ref 11.5–15.5)
WBC: 11.7 K/uL — ABNORMAL HIGH (ref 4.0–10.5)
nRBC: 0 % (ref 0.0–0.2)

## 2024-06-16 LAB — CBG MONITORING, ED: Glucose-Capillary: 103 mg/dL — ABNORMAL HIGH (ref 70–99)

## 2024-06-16 MED ORDER — IOHEXOL 350 MG/ML SOLN
80.0000 mL | Freq: Once | INTRAVENOUS | Status: AC | PRN
  Administered 2024-06-16: 80 mL via INTRAVENOUS

## 2024-06-16 NOTE — ED Provider Notes (Signed)
 [facility] AT [facility] Provider Note   CSN: [id number] Arrival date & time: 06/16/24  1544     Patient presents with: No chief complaint on file.   Sandy Daniels is a 73 y.o. female.   Patient with history of hypertension, hypercholesterolemia, diabetes, hypothyroidism --presents to the emergency department today for evaluation of episode of headache and vision change at around 3 AM today.  Patient awoke stating that she needed to use the restroom.  She got up and shortly afterwards developed a acute frontal headache.  She does not typically get headaches.  No associated vomiting or confusion.  She sat down in a chair.  She noted that she had jagged lines in her bilateral vision.  Reports history of cataract surgery earlier in the year.  Symptoms lasted for about 3 minutes and then resolved.  Patient denies signs of stroke including: facial droop, slurred speech, aphasia, weakness/numbness in extremities, imbalance/trouble walking.  She was anxious to go back to sleep and currently feels tired.       Prior to Admission medications   Medication Sig Start Date End Date Taking? Authorizing Provider  amLODipine (NORVASC) 5 MG tablet Take 5 mg by mouth daily.    [provider]  carvedilol (COREG) 6.25 MG tablet Take 6.25 mg by mouth 2 (two) times daily.    [provider]  dapagliflozin propanediol (FARXIGA) 10 MG TABS tablet Farxiga 10 mg tablet  Take 1 tablet every day by oral route.    [provider]  famotidine  (PEPCID ) 40 MG tablet Take 1 tablet (40 mg total) by mouth as needed for heartburn or indigestion. 03/07/24   Anner Alm ORN, MD  levothyroxine  (SYNTHROID , LEVOTHROID) 100 MCG tablet Take 100 mcg by mouth daily before breakfast.    [provider]  olmesartan (BENICAR) 40 MG tablet Take 40 mg by mouth daily.    [provider]  rosuvastatin (CRESTOR) 10 MG tablet Take 10 mg by mouth daily. 01/14/17    [provider]  Semaglutide (OZEMPIC, 1 MG/DOSE, [place]) Inject 1 mg into the skin daily.    [provider]    Allergies: Betadine [povidone iodine], Neosporin [neomycin-bacitracin  zn-polymyx], Nexium [esomeprazole magnesium], and Penicillins    Review of Systems  Updated Vital Signs BP (!) 149/70   Pulse 92   Temp 98.3 F (36.8 C) (Oral)   Resp 16   SpO2 100%   Physical Exam Vitals and nursing note reviewed.  Constitutional:      Appearance: She is well-developed.  HENT:     Head: Normocephalic and atraumatic.     Right Ear: Tympanic membrane, ear canal and external ear normal.     Left Ear: Tympanic membrane, ear canal and external ear normal.     Nose: Nose normal.     Mouth/Throat:     Mouth: Mucous membranes are moist.     Pharynx: Uvula midline.  Eyes:     General: Lids are normal.     Extraocular Movements:     Right eye: No nystagmus.     Left eye: No nystagmus.     Conjunctiva/sclera: Conjunctivae normal.     Pupils: Pupils are equal, round, and reactive to light.  Cardiovascular:     Rate and Rhythm: Normal rate and regular rhythm.  Pulmonary:     Effort: Pulmonary effort is normal.     Breath sounds: Normal breath sounds.  Abdominal:     Palpations: Abdomen is soft.  Tenderness: There is no abdominal tenderness.  Musculoskeletal:     Cervical back: Normal range of motion and neck supple. No tenderness or bony tenderness.  Skin:    General: Skin is warm and dry.  Neurological:     Mental Status: She is alert and oriented to person, place, and time.     GCS: GCS eye subscore is 4. GCS verbal subscore is 5. GCS motor subscore is 6.     Cranial Nerves: No cranial nerve deficit.     Sensory: No sensory deficit.     Motor: No weakness.     Coordination: Coordination normal.     Gait: Gait normal.     Comments: Upper extremity myotomes tested bilaterally:  C5 Shoulder abduction 5/5 C6 Elbow flexion/wrist extension 5/5 C7 Elbow extension  5/5 C8 Finger flexion 5/5 T1 Finger abduction 5/5  Lower extremity myotomes tested bilaterally: L2 Hip flexion 5/5 L3 Knee extension 5/5 L4 Ankle dorsiflexion 5/5 S1 Ankle plantar flexion 5/5      (all labs ordered are listed, but only abnormal results are displayed) Labs Reviewed  CBG MONITORING, ED - Abnormal; Notable for the following components:      Result Value   Glucose-Capillary 103 (*)    All other components within normal limits  CBC WITH DIFFERENTIAL/PLATELET  COMPREHENSIVE METABOLIC PANEL WITH GFR    EKG: None  Radiology: No results found.   Procedures   Medications Ordered in the ED - No data to display  ED Course  Patient seen and examined. History obtained directly from patient and daughter.  Labs/EKG: Ordered CBC, CMP, EKG.  Imaging: Ordered CT angio head and neck.  Medications/Fluids: None ordered  Most recent vital signs reviewed and are as follows: BP (!) 149/70   Pulse 92   Temp 98.3 F (36.8 C) (Oral)   Resp 16   SpO2 100%   Initial impression: Patient currently asymptomatic, given acute headache and vision change will obtain imaging.  Will obtain CT angio head and neck to evaluate for stenosis/aneurysm.  7:02 PM Reassessment performed. Patient appears stable.  No recurrent symptoms.  Labs personally reviewed and interpreted including: CBC with slightly elevated white blood cell count and hemoglobin otherwise unremarkable; CMP unremarkable.  Imaging pending results.  Reviewed pertinent lab work and imaging with patient and family at bedside. Questions answered.   Most current vital signs reviewed and are as follows: BP (!) 143/72   Pulse 79   Temp 98.3 F (36.8 C) (Oral)   Resp 19   SpO2 99%   7:03 PM Signout to [employer] at shift change.   Plan: Follow-up on CT results.  If no significant findings, can be discharged home.  She has ophthalmology appointment in 3 days.  Encouraged PCP follow-up.                                    Medical Decision Making Amount and/or Complexity of Data Reviewed Labs: ordered. Radiology: ordered.  Risk Prescription drug management.   Patient with transient episode of vision change and headache.  Symptoms resolved after a brief period.  No focal neurodeficits on exam.  No recurrent symptoms.  Unclear if this was TIA.  Currently awaiting completion of imaging.  Patient has been stable in ED.  No cardiac symptoms, lightheadedness or syncope to suggest arrhythmia.  If negative, feel that she can follow-up as outpatient.  She has upcoming ophthalmology appointment  to have her eyes checked.     Final diagnoses:  Acute nonintractable headache, unspecified headache type  Visual disturbance  Multiple thyroid  nodules    ED Discharge Orders     None          Desiderio Chew, PA-C 06/18/24 1819    Geraldene Hamilton, MD 06/20/24 2150

## 2024-06-16 NOTE — ED Notes (Signed)
 Patient transported to CT

## 2024-06-16 NOTE — ED Provider Notes (Signed)
 Physical Exam  BP (!) 143/72   Pulse 79   Temp 98.3 F (36.8 C) (Oral)   Resp 19   SpO2 99%   Physical Exam Vitals and nursing note reviewed.  Constitutional:      General: She is not in acute distress.    Appearance: She is not ill-appearing or toxic-appearing.  Eyes:     General: No scleral icterus. Pulmonary:     Effort: Pulmonary effort is normal. No respiratory distress.  Skin:    General: Skin is warm and dry.  Neurological:     Mental Status: She is alert.     Comments: Moving all extremities     Procedures  Procedures  ED Course / MDM    Medical Decision Making Amount and/or Complexity of Data Reviewed Labs: ordered. Radiology: ordered.  Risk Prescription drug management.   Accepted handoff at shift change from [place] PA-C. Please see prior provider note for more detail.   Briefly: Patient is 73 y.o. F presenting for evaluation fo headache with some visual changes this AM around 0300. Symptoms lasted about 3 minutes and have not recurred. Patient has follow up with ophthalmology on Monday  Plan: Follow up on CTA.   CTA 1. No acute intracranial abnormality. 2. No large vessel occlusion, high-grade stenosis, aneurysm, or dissection of the arteries in the head and neck. 3. Mild stenosis of the left cavernous ICA and mild-to-moderate stenosis of the right cavernous ICA. 4. Enlarged thyroid  with multiple nodules, the largest in the left lobe measuring up to 3.0 cm. Recommend nonemergent correlation with thyroid  ultrasound. 5. Aortic atherosclerosis. Per radiologist's interpretation.    Discussed stenosis and thyroid  nodules seen on imaging with daughter and patient at bedside, recommended follow up with PCP outpatient for this. She still has not had any recurrence of symptoms and is ready for discharge home. We discussed the importance of follow up with ophthalmology on Monday. We discussed strict return precautions and red flag symptoms. They verbalize their  understanding and agree with the plan. The patient is stable for discharge home with strict return precautions.   Results for orders placed or performed during the hospital encounter of 06/16/24  CBG monitoring, ED   Collection Time: 06/16/24  3:53 PM  Result Value Ref Range   Glucose-Capillary 103 (H) 70 - 99 mg/dL  CBC with Differential   Collection Time: 06/16/24  4:08 PM  Result Value Ref Range   WBC 11.7 (H) 4.0 - 10.5 K/uL   RBC 5.18 (H) 3.87 - 5.11 MIL/uL   Hemoglobin 15.3 (H) 12.0 - 15.0 g/dL   HCT 54.3 63.9 - 53.9 %   MCV 88.0 80.0 - 100.0 fL   MCH 29.5 26.0 - 34.0 pg   MCHC 33.6 30.0 - 36.0 g/dL   RDW 84.5 88.4 - 84.4 %   Platelets 189 150 - 400 K/uL   nRBC 0.0 0.0 - 0.2 %   Neutrophils Relative % 57 %   Neutro Abs 6.7 1.7 - 7.7 K/uL   Lymphocytes Relative 33 %   Lymphs Abs 3.8 0.7 - 4.0 K/uL   Monocytes Relative 8 %   Monocytes Absolute 0.9 0.1 - 1.0 K/uL   Eosinophils Relative 2 %   Eosinophils Absolute 0.2 0.0 - 0.5 K/uL   Basophils Relative 0 %   Basophils Absolute 0.0 0.0 - 0.1 K/uL   Immature Granulocytes 0 %   Abs Immature Granulocytes 0.03 0.00 - 0.07 K/uL  Comprehensive metabolic panel with GFR   Collection  Time: 06/16/24  5:30 PM  Result Value Ref Range   Sodium 142 135 - 145 mmol/L   Potassium 4.3 3.5 - 5.1 mmol/L   Chloride 108 98 - 111 mmol/L   CO2 21 (L) 22 - 32 mmol/L   Glucose, Bld 92 70 - 99 mg/dL   BUN 11 8 - 23 mg/dL   Creatinine, Ser 9.12 0.44 - 1.00 mg/dL   Calcium 9.5 8.9 - 89.6 mg/dL   Total Protein 6.9 6.5 - 8.1 g/dL   Albumin 3.5 3.5 - 5.0 g/dL   AST 23 15 - 41 U/L   ALT 9 0 - 44 U/L   Alkaline Phosphatase 81 38 - 126 U/L   Total Bilirubin 0.6 0.0 - 1.2 mg/dL   GFR, Estimated >39 >39 mL/min   Anion gap 12 5 - 15   CT ANGIO HEAD NECK W WO CM Result Date: 06/16/2024 EXAM: CTA HEAD AND NECK WITH AND WITHOUT 06/16/2024 06:51:35 PM TECHNIQUE: CTA of the head and neck was performed with and without the administration of intravenous  contrast. Multiplanar 2D and/or 3D reformatted images are provided for review. Automated exposure control, iterative reconstruction, and/or weight based adjustment of the mA/kV was utilized to reduce the radiation dose to as low as reasonably achievable. Stenosis of the internal carotid arteries measured using NASCET criteria. COMPARISON: CT head dated 01/21/2006 CLINICAL HISTORY: Stroke/TIA, determine embolic source; Transient ischemic attack (TIA); acute episode of headache, jagged lines in bilateral vision, lasted several minutes and resolved. FINDINGS: CTA NECK: AORTIC ARCH AND ARCH VESSELS: Mild atherosclerosis of the visualized aortic arch common origin of the brachiocephalic and left common carotid arteries. No dissection or arterial injury. No significant stenosis of the brachiocephalic or subclavian arteries. CERVICAL CAROTID ARTERIES: Mild atherosclerosis at the right carotid bifurcation without hemodynamically significant stenosis. Atherosclerosis at the left carotid bifurcation without hemodynamically significant stenosis. No dissection, arterial injury, or hemodynamically significant stenosis by NASCET criteria. CERVICAL VERTEBRAL ARTERIES: The vertebral arteries are patent from the origins to the vertebrobasilar confluence. Mild tortuosity of the left V1 segment. No dissection, arterial injury, or significant stenosis. LUNGS AND MEDIASTINUM: Unremarkable. SOFT TISSUES: Enlargement of the thyroid  with heterogeneous appearance. There are multiple thyroid  nodules with the largest in the left thyroid  lobe measuring up to 3.0 cm. BONES: Degenerative changes in the visualized spine. Anterior plate osteophytes at multiple levels. There are posterior disc osteophyte complexes resulting in at least mild spinal canal stenosis at C5-6 and C6-7. Cervical rib on the right at C7. CTA HEAD: ANTERIOR CIRCULATION: The intracranial internal carotid arteries are patent bilaterally. Atherosclerosis of the carotid siphon  is slightly greater on the right. There is mild stenosis of the left cavernous ICA. Mild-to-moderate stenosis of the right cavernous ICA. The middle cerebral arteries are patent bilaterally. The anterior cerebral arteries are patent bilaterally. No aneurysm. POSTERIOR CIRCULATION: No significant stenosis of the posterior cerebral arteries. No significant stenosis of the basilar artery. No significant stenosis of the vertebral arteries. No aneurysm. OTHER: No dural venous sinus thrombosis on this non-dedicated study. On the non-contrast head CT portion of the examination there is no acute intracranial hemorrhage, mass effect, edema or midline shift. Nonspecific hypoattenuation in the periventricular and subcortical white matter, most likely representing chronic small vessel disease. Grey-white matter differentiation is maintained. Normal appearance of the ventricles. The basilar cisterns are patent. No extraaxial fluid collections. Bilateral lens replacement. IMPRESSION: 1. No acute intracranial abnormality. 2. No large vessel occlusion, high-grade stenosis, aneurysm, or dissection of the arteries  in the head and neck. 3. Mild stenosis of the left cavernous ICA and mild-to-moderate stenosis of the right cavernous ICA. 4. Enlarged thyroid  with multiple nodules, the largest in the left lobe measuring up to 3.0 cm. Recommend nonemergent correlation with thyroid  ultrasound. 5. Aortic atherosclerosis. Electronically signed by: Donnice Mania MD 06/16/2024 07:51 PM EDT RP Workstation: [id number]          Bernis Ernst, PA-C 06/17/24 [id number]    Geraldene Hamilton, MD 06/20/24 2149

## 2024-06-16 NOTE — Discharge Instructions (Addendum)
 Please read and follow all provided instructions.  Your diagnoses today include:  1. Acute nonintractable headache, unspecified headache type   2. Visual disturbance     Tests performed today include: CT of your head and neck Complete blood cell count: Slightly high white blood cell count and hemoglobin Complete metabolic panel: No concerning findings Vital signs. See below for your results today.   Medications:  None  Take any prescribed medications only as directed.  Additional information:  Follow any educational materials contained in this packet.  You had a headache. No specific cause was found today for your headache. It may have been a migraine or other cause of headache. Stress, anxiety, fatigue, and depression are common triggers for headaches.   Your headache today does not appear to be life-threatening or require hospitalization, but often the exact cause of headaches is not determined in the emergency department. Therefore, follow-up with your doctor is very important to find out what may have caused your headache and whether or not you need any further diagnostic testing or treatment.   Sometimes headaches can appear benign (not harmful), but then more serious symptoms can develop which should prompt an immediate re-evaluation by your doctor or the emergency department.  BE VERY CAREFUL not to take multiple medicines containing Tylenol  (also called acetaminophen ). Doing so can lead to an overdose which can damage your liver and cause liver failure and possibly death.   Follow-up instructions: Please follow-up with your primary care provider in the next 7 days for further evaluation of your symptoms. You also have some thyroid  nodules seen on imaging. Please follow up with your PCP as these will need outpatient follow up.   See your ophthalmologist on Monday as previously planned.   Return instructions:  Please return to the Emergency Department if you experience  worsening symptoms. Return if the medications do not resolve your headache, if it recurs, or if you have multiple episodes of vomiting or cannot keep down fluids. Return if you have a change from the usual headache. RETURN IMMEDIATELY IF you: Develop a sudden, severe headache Develop confusion or become poorly responsive or faint Develop a fever above 100.50F or problem breathing Have a change in speech, vision, swallowing, or understanding Develop new weakness, numbness, tingling, incoordination in your arms or legs Have a seizure Please return if you have any other emergent concerns.  Additional Information:  Your vital signs today were: BP (!) 143/72   Pulse 79   Temp 98.3 F (36.8 C) (Oral)   Resp 19   SpO2 99%  If your blood pressure (BP) was elevated above 135/85 this visit, please have this repeated by your doctor within one month. --------------

## 2024-06-16 NOTE — ED Triage Notes (Signed)
 0300-Stood up from toilet (urination)-saw jagged lines in vision accompanied by headache-vision and HA resolved in about 5 minutes. Completely resolved on arrival to ED no neuro deficits appreciated. -Cp, -SOB,-N/-V/-D. Hx diabetes.

## 2024-06-20 DIAGNOSIS — E119 Type 2 diabetes mellitus without complications: Secondary | ICD-10-CM | POA: Diagnosis not present

## 2024-06-20 DIAGNOSIS — H524 Presbyopia: Secondary | ICD-10-CM | POA: Diagnosis not present

## 2024-06-22 DIAGNOSIS — B351 Tinea unguium: Secondary | ICD-10-CM | POA: Diagnosis not present

## 2024-06-22 DIAGNOSIS — L603 Nail dystrophy: Secondary | ICD-10-CM | POA: Diagnosis not present

## 2024-06-22 DIAGNOSIS — L600 Ingrowing nail: Secondary | ICD-10-CM | POA: Diagnosis not present

## 2024-06-22 DIAGNOSIS — I739 Peripheral vascular disease, unspecified: Secondary | ICD-10-CM | POA: Diagnosis not present

## 2024-06-22 DIAGNOSIS — M2041 Other hammer toe(s) (acquired), right foot: Secondary | ICD-10-CM | POA: Diagnosis not present

## 2024-06-22 DIAGNOSIS — I70203 Unspecified atherosclerosis of native arteries of extremities, bilateral legs: Secondary | ICD-10-CM | POA: Diagnosis not present

## 2024-06-28 ENCOUNTER — Other Ambulatory Visit: Payer: Self-pay | Admitting: Registered Nurse

## 2024-06-28 DIAGNOSIS — G43109 Migraine with aura, not intractable, without status migrainosus: Secondary | ICD-10-CM | POA: Diagnosis not present

## 2024-06-28 DIAGNOSIS — I6523 Occlusion and stenosis of bilateral carotid arteries: Secondary | ICD-10-CM | POA: Diagnosis not present

## 2024-06-28 DIAGNOSIS — E042 Nontoxic multinodular goiter: Secondary | ICD-10-CM

## 2024-06-28 DIAGNOSIS — Z09 Encounter for follow-up examination after completed treatment for conditions other than malignant neoplasm: Secondary | ICD-10-CM | POA: Diagnosis not present

## 2024-06-30 ENCOUNTER — Ambulatory Visit
Admission: RE | Admit: 2024-06-30 | Discharge: 2024-06-30 | Disposition: A | Discharge status: Home / Self Care | Source: Ambulatory Visit | Attending: Registered Nurse | Admitting: Registered Nurse

## 2024-06-30 DIAGNOSIS — E042 Nontoxic multinodular goiter: Secondary | ICD-10-CM | POA: Diagnosis not present

## 2024-06-30 DIAGNOSIS — I6523 Occlusion and stenosis of bilateral carotid arteries: Secondary | ICD-10-CM | POA: Diagnosis not present

## 2024-07-08 DIAGNOSIS — L603 Nail dystrophy: Secondary | ICD-10-CM | POA: Diagnosis not present

## 2024-07-10 ENCOUNTER — Other Ambulatory Visit: Payer: Self-pay | Admitting: Registered Nurse

## 2024-07-10 DIAGNOSIS — E041 Nontoxic single thyroid nodule: Secondary | ICD-10-CM

## 2024-07-18 ENCOUNTER — Ambulatory Visit: Admitting: Gastroenterology

## 2024-07-26 ENCOUNTER — Ambulatory Visit
Admission: RE | Admit: 2024-07-26 | Discharge: 2024-07-26 | Disposition: A | Discharge status: Home / Self Care | Source: Ambulatory Visit | Attending: Registered Nurse | Admitting: Registered Nurse

## 2024-07-26 ENCOUNTER — Other Ambulatory Visit (HOSPITAL_COMMUNITY)
Admission: RE | Admit: 2024-07-26 | Discharge: 2024-07-26 | Disposition: A | Discharge status: Home / Self Care | Source: Ambulatory Visit | Attending: Registered Nurse | Admitting: Registered Nurse

## 2024-07-26 DIAGNOSIS — E041 Nontoxic single thyroid nodule: Secondary | ICD-10-CM | POA: Insufficient documentation

## 2024-07-28 LAB — CYTOLOGY - NON PAP

## 2024-08-22 DIAGNOSIS — E039 Hypothyroidism, unspecified: Secondary | ICD-10-CM | POA: Diagnosis not present

## 2024-08-22 DIAGNOSIS — Z0000 Encounter for general adult medical examination without abnormal findings: Secondary | ICD-10-CM | POA: Diagnosis not present

## 2024-08-22 DIAGNOSIS — E7800 Pure hypercholesterolemia, unspecified: Secondary | ICD-10-CM | POA: Diagnosis not present

## 2024-08-22 DIAGNOSIS — E559 Vitamin D deficiency, unspecified: Secondary | ICD-10-CM | POA: Diagnosis not present

## 2024-08-22 DIAGNOSIS — E1165 Type 2 diabetes mellitus with hyperglycemia: Secondary | ICD-10-CM | POA: Diagnosis not present

## 2024-08-22 LAB — LAB REPORT - SCANNED
A1c: 6.9
EGFR: 60

## 2024-08-28 ENCOUNTER — Ambulatory Visit: Admitting: Gastroenterology

## 2024-08-29 DIAGNOSIS — I10 Essential (primary) hypertension: Secondary | ICD-10-CM | POA: Diagnosis not present

## 2024-08-29 DIAGNOSIS — Z0000 Encounter for general adult medical examination without abnormal findings: Secondary | ICD-10-CM | POA: Diagnosis not present

## 2024-08-29 DIAGNOSIS — E559 Vitamin D deficiency, unspecified: Secondary | ICD-10-CM | POA: Diagnosis not present

## 2024-08-29 DIAGNOSIS — E039 Hypothyroidism, unspecified: Secondary | ICD-10-CM | POA: Diagnosis not present

## 2024-08-29 DIAGNOSIS — I700 Atherosclerosis of aorta: Secondary | ICD-10-CM | POA: Diagnosis not present

## 2024-08-29 DIAGNOSIS — E785 Hyperlipidemia, unspecified: Secondary | ICD-10-CM | POA: Diagnosis not present

## 2024-08-29 DIAGNOSIS — I6523 Occlusion and stenosis of bilateral carotid arteries: Secondary | ICD-10-CM | POA: Diagnosis not present

## 2024-08-29 DIAGNOSIS — E1169 Type 2 diabetes mellitus with other specified complication: Secondary | ICD-10-CM | POA: Diagnosis not present

## 2024-08-29 DIAGNOSIS — Z23 Encounter for immunization: Secondary | ICD-10-CM | POA: Diagnosis not present

## 2024-10-11 NOTE — Progress Notes (Signed)
 Sandy Daniels [id number] 05-Mar-1951   Chief Complaint: Constipation  Referring Provider: Clarice Nottingham, MD Primary GI MD: Dr. Avram  HPI: Sandy Daniels is a 73 y.o. female with past medical history of anxiety, diabetes, GERD, previous H. pylori infection, HLD, HTN, diverticulitis, hypothyroidism, morbid obesity, colon polyps, hysterectomy who presents today for a complaint of constipation.    Last seen in office 11/21/2020 by Jessica Zehr, PA-C for chronic constipation.  Was on Linzess  290 mcg daily at that time.  Endorsed remote history of diverticulitis.  Endorsed abdominal pain for several days. Diverticulitis not suspected. Advised to continue Linzess  and was prescribed Levsin  to take as needed.  Patient called 12/30/2023 endorsing lower abdominal pain and concern for diverticulitis flare.  Dr. Albertus prescribed course of Cipro  and Flagyl .  Patient called 06/06/2024 reporting lower abdominal pain which felt like diverticulitis flare.  Was again prescribed Cipro  and Flagyl  and advised to follow-up in the office.  She has had CT confirmed diverticulitis previously, seen on CT 2014.  Patient seen in the ED 06/16/2024 for acute non-intractable headache.  CTA showed mild stenosis of the left cavernous ICA and mild to moderate stenosis of the right cavernous ICA.  Enlarged thyroid  with multiple nodules and thyroid  ultrasound recommended.  Advised to follow-up with PCP regarding stenosis and thyroid  nodules, and also to follow-up with ophthalmology as she had had some visual changes.  She has since had thyroid  ultrasound as well as biopsy of thyroid  nodule.  Carotid ultrasound showed minor carotid atherosclerosis, negative for stenosis.   Discussed the use of AI scribe software for clinical note transcription with the patient, who gave verbal consent to proceed.  History of Present Illness Sandy Daniels is a 73 year old female with constipation and history of diverticulitis who presents for  management of constipation.  Constipation - Chronic constipation with prior treatment using Linzess , discontinued due to high cost - Currently uses milk of magnesia as needed for relief - Bowel movements occur daily, sometimes twice daily, influenced by dietary intake, especially apples - No blood in stool or melena  Diverticulitis - Two episodes of diverticulitis in the past year - Both episodes resolved with antibiotic therapy - No current abdominal pain, hematochezia, or melena  Cardiac symptoms - Past episode of rapid heart rate, resolved without recurrence or ongoing issues - Follows with cardiology  Musculoskeletal symptoms - History of arthritis and chronic back pain   Previous GI Procedures/Imaging   Colonoscopy 01/26/2020 - Three diminutive polyps in the sigmoid colon and in the descending colon, removed with a cold snare. Resected and retrieved.  - Diverticulosis in the sigmoid colon.  - The examination was otherwise normal on direct and retroflexion views.  - Personal history of colonic polyps. Multiple adenomas and an advanced adenoma - Recall 5 years Path: Surgical [P], colon, descending, sigmoid, polyp (3)  - TUBULAR ADENOMA (1 OF 3 FRAGMENTS) - HYPERPLASTIC POLYP (1 OF 3 FRAGMENTS) - BENIGN COLONIC MUCOSA (1 OF 3 FRAGMENTS) - NO HIGH GRADE DYSPLASIA OR MALIGNANCY IDENTIFIED   CT A/P 04/30/2019 IMPRESSION: 1. No acute process identified. 2. Hepatic steatosis. 3. Cholelithiasis. 4. Mild sigmoid diverticulosis. 5. Aortic Atherosclerosis (ICD10-I70.0).  CT A/P 02/25/2013 IMPRESSION:  Short segmental sigmoid colonic wall thickening, surrounding  stranding, and trace pericolonic fluid, most compatible with  diverticulitis.  More focal amorphous fluid density adjacent to  this segment measuring 2.2 cm image 56 could indicate developing  phlegmon or early abscess.  After symptoms resolve, if the patient  has not had screening colonoscopy, this could be considered  to  exclude underlying malignancy.   EGD 05/2003 Changes of gastritis, biopsied and biopsy of distal esophagus. Path: 1. STOMACH, BIOPSY: CHRONIC, ACTIVE GASTRITIS WITH HELICOBACTER   PYLORI. NO INTESTINAL METAPLASIA OR MALIGNANCY IDENTIFIED.    2. DISTAL ESOPHAGUS, BIOPSY: BENIGN GASTROESOPHAGEAL JUNCTION   MUCOSA WITH ACUTE AND CHRONIC INFLAMMATION. NO INTESTINAL   METAPLASIA IDENTIFIED.   Past Medical History:  Diagnosis Date   Anxiety    Diabetes mellitus without complication (HCC)    Diverticulitis large intestine 01/2013   GERD (gastroesophageal reflux disease)    Goiter    History of Helicobacter pylori infection 05/25/2003   gastritis at EGD   Hyperlipidemia    Hypertension    Hypothyroidism    On thyroid  supplementation   Morbid obesity with BMI of 45.0-49.9, adult (HCC)    OA (osteoarthritis)    Personal history of colonic adenomas 06/12/2013   Seborrheic dermatitis    Tendonitis of ankle    Vitamin D deficiency     Past Surgical History:  Procedure Laterality Date   COLONOSCOPY  03/15/2002, 01/22/2017   internal hemorrhoids, Dr. Zachary Porch   ESOPHAGOGASTRODUODENOSCOPY  05/25/2003   H. Pylori, Dr. Zachary Porch   NM MYOVIEW  LTD  10/08/2011   LOW RISK study. EF 68%. Breast attenuation artifact noted otherwise normal study.   POLYPECTOMY     TONSILLECTOMY     TRANSTHORACIC ECHOCARDIOGRAM  10/08/2011   Normal study. No valve lesions. Normal LV function, EF >55%, GR 1 DD. No valve lesions   UPPER GASTROINTESTINAL ENDOSCOPY     VAGINAL HYSTERECTOMY      Current Outpatient Medications  Medication Sig Dispense Refill   acetaminophen  (TYLENOL  8 HOUR) 650 MG CR tablet Take 1,300 mg by mouth every 8 (eight) hours as needed for pain.     amLODipine (NORVASC) 5 MG tablet Take 5 mg by mouth daily.     carvedilol (COREG) 6.25 MG tablet Take 6.25 mg by mouth 2 (two) times daily.     Cholecalciferol (VITAMIN D3) 25 MCG (1000 UT) CAPS Take 1 capsule by mouth daily.      dapagliflozin propanediol (FARXIGA) 10 MG TABS tablet Farxiga 10 mg tablet  Take 1 tablet every day by oral route.     famotidine  (PEPCID ) 40 MG tablet Take 1 tablet (40 mg total) by mouth as needed for heartburn or indigestion. 90 tablet 0   levothyroxine  (SYNTHROID , LEVOTHROID) 100 MCG tablet Take 100 mcg by mouth daily before breakfast.     olmesartan (BENICAR) 40 MG tablet Take 40 mg by mouth daily.     rosuvastatin (CRESTOR) 10 MG tablet Take 10 mg by mouth daily.  5   Semaglutide, 2 MG/DOSE, (OZEMPIC, 2 MG/DOSE,) 8 MG/3ML SOPN Inject 2 mg as directed once a week.     No current facility-administered medications for this visit.    Allergies as of 10/12/2024 - Review Complete 10/12/2024  Allergen Reaction Noted   Betadine [povidone iodine] Rash 02/25/2013   Neosporin [neomycin-bacitracin  zn-polymyx] Rash 02/25/2013   Nexium [esomeprazole magnesium] Other (See Comments) 03/24/2013   Penicillins Hives and Swelling 02/25/2013    Family History  Problem Relation Age of Onset   Dementia Father    Diabetes Father    Heart disease Father    Hypertension Father    Allergic rhinitis Father    Heart disease Mother    Diabetes Mother    Colon cancer Neg Hx    Rectal cancer  Neg Hx    Stomach cancer Neg Hx    Esophageal cancer Neg Hx    Pancreatic cancer Neg Hx    Colon polyps Neg Hx     Social History   Tobacco Use   Smoking status: Former    Current packs/day: 0.00    Types: Cigarettes    Quit date: 01/03/2013    Years since quitting: 11.7   Smokeless tobacco: Never  Vaping Use   Vaping status: Never Used  Substance Use Topics   Alcohol use: No   Drug use: No     Review of Systems:    Constitutional: No unintentional weight loss, fever, chills Cardiovascular: No chest pain Respiratory: No SOB Gastrointestinal: See HPI and otherwise negative   Physical Exam:  Vital signs: BP 110/68   Pulse 85   Ht 5' 6 (1.676 m)   Wt 277 lb 4 oz (125.8 kg)   SpO2 97%   BMI  44.75 kg/m   Constitutional: Pleasant, obese female in NAD, alert and cooperative, sneezing throughout exam (states she is allergic to something in the room, forgot to take her allergy medication this morning) Head:  Normocephalic and atraumatic.  Respiratory: Respirations even and unlabored. Lungs clear to auscultation bilaterally.  No wheezes, crackles, or rhonchi.  Cardiovascular:  Regular rate and rhythm. No murmurs. No peripheral edema. Gastrointestinal:  Soft, nondistended, nontender. No rebound or guarding. Normal bowel sounds. No appreciable masses or hepatomegaly. Rectal:  Not performed.  Neurologic:  Alert and oriented x4;  grossly normal neurologically.  Skin:   Dry and intact without significant lesions or rashes. Psychiatric: Oriented to person, place and time. Demonstrates good judgement and reason without abnormal affect or behaviors.   RELEVANT LABS AND IMAGING: CBC    Component Value Date/Time   WBC 11.7 (H) 06/16/2024 1608   RBC 5.18 (H) 06/16/2024 1608   HGB 15.3 (H) 06/16/2024 1608   HCT 45.6 06/16/2024 1608   PLT 189 06/16/2024 1608   MCV 88.0 06/16/2024 1608   MCH 29.5 06/16/2024 1608   MCHC 33.6 06/16/2024 1608   RDW 15.4 06/16/2024 1608   LYMPHSABS 3.8 06/16/2024 1608   MONOABS 0.9 06/16/2024 1608   EOSABS 0.2 06/16/2024 1608   BASOSABS 0.0 06/16/2024 1608    CMP     Component Value Date/Time   NA 142 06/16/2024 1730   K 4.3 06/16/2024 1730   CL 108 06/16/2024 1730   CO2 21 (L) 06/16/2024 1730   GLUCOSE 92 06/16/2024 1730   BUN 11 06/16/2024 1730   CREATININE 0.87 06/16/2024 1730   CALCIUM 9.5 06/16/2024 1730   PROT 6.9 06/16/2024 1730   ALBUMIN 3.5 06/16/2024 1730   AST 23 06/16/2024 1730   ALT 9 06/16/2024 1730   ALKPHOS 81 06/16/2024 1730   BILITOT 0.6 06/16/2024 1730   GFRNONAA >60 06/16/2024 1730   GFRAA >60 04/30/2019 1615     Assessment/Plan:   Assessment & Plan  Chronic constipation Currently having a bowel movement daily.   Uses milk of magnesia as needed.  Has previously had success with Linzess  290 mcg but last time she tried to get prescription 2 years ago it was not covered by her insurance and she had to stop using due to cost.  She would like some samples today and wants to see if she can get back on Linzess .  No abdominal pain or blood in stool. Hemorrhoids present without bleeding.  - Provided Linzess  290 mcg samples. - Will send prescription for Linzess   to see if it is covered, otherwise can try alternatives - Consider Trulance or Amitiza if Linzess  not covered. - Follow-up 6 months or sooner as needed  History of diverticulitis Two resolved episodes with antibiotics. No current symptoms.  History of adenomatous colon polyps Due for colonoscopy in March.  March schedule is not open yet.  Patient likely can be scheduled directly when recall is due.  - Schedule colonoscopy in March, morning preferred.    Camie Furbish, PA-C [facility] 10/12/2024, 8:45 AM  Patient Care Team: Clarice Nottingham, MD as PCP - General (Internal Medicine) Anner Alm ORN, MD as PCP - Cardiology (Cardiology)

## 2024-10-12 ENCOUNTER — Ambulatory Visit (INDEPENDENT_AMBULATORY_CARE_PROVIDER_SITE_OTHER): Admitting: Gastroenterology

## 2024-10-12 ENCOUNTER — Encounter: Payer: Self-pay | Admitting: Gastroenterology

## 2024-10-12 VITALS — BP 110/68 | HR 85 | Ht 66.0 in | Wt 277.2 lb

## 2024-10-12 DIAGNOSIS — Z8719 Personal history of other diseases of the digestive system: Secondary | ICD-10-CM

## 2024-10-12 DIAGNOSIS — Z860101 Personal history of adenomatous and serrated colon polyps: Secondary | ICD-10-CM | POA: Diagnosis not present

## 2024-10-12 DIAGNOSIS — K5909 Other constipation: Secondary | ICD-10-CM

## 2024-10-12 DIAGNOSIS — Z860100 Personal history of colon polyps, unspecified: Secondary | ICD-10-CM

## 2024-10-12 MED ORDER — LINACLOTIDE 290 MCG PO CAPS: 290.0000 ug | ORAL_CAPSULE | Freq: Every day | ORAL | 5 refills | Status: AC

## 2024-10-12 NOTE — Patient Instructions (Signed)
 We have sent the following medications to your pharmacy for you to pick up at your convenience:  Linzess   _______________________________________________________  If your blood pressure at your visit was 140/90 or greater, please contact your primary care physician to follow up on this.  _______________________________________________________  If you are age 74 or older, your body mass index should be between 23-30. Your Body mass index is 44.75 kg/m. If this is out of the aforementioned range listed, please consider follow up with your Primary Care Provider.  If you are age 36 or younger, your body mass index should be between 19-25. Your Body mass index is 44.75 kg/m. If this is out of the aformentioned range listed, please consider follow up with your Primary Care Provider.   ________________________________________________________  The [facility] providers would like to encourage you to use MYCHART to communicate with providers for non-urgent requests or questions.  Due to long hold times on the telephone, sending your provider a message by [facility] may be a faster and more efficient way to get a response.  Please allow 48 business hours for a response.  Please remember that this is for non-urgent requests.  _______________________________________________________  [facility] is using a team-based approach to care.  Your team is made up of your doctor and two to three APPS. Our APPS (Nurse Practitioners and Physician Assistants) work with your physician to ensure care continuity for you. They are fully qualified to address your health concerns and develop a treatment plan. They communicate directly with your gastroenterologist to care for you. Seeing the Advanced Practice Practitioners on your physician's team can help you by facilitating care more promptly, often allowing for earlier appointments, access to diagnostic testing, procedures, and other specialty referrals.

## 2024-11-01 ENCOUNTER — Telehealth: Payer: Self-pay | Admitting: Gastroenterology

## 2024-11-01 DIAGNOSIS — K5732 Diverticulitis of large intestine without perforation or abscess without bleeding: Secondary | ICD-10-CM

## 2024-11-01 MED ORDER — METRONIDAZOLE 250 MG PO TABS: 250.0000 mg | ORAL_TABLET | Freq: Three times a day (TID) | ORAL | 0 refills | Status: AC

## 2024-11-01 MED ORDER — CIPROFLOXACIN HCL 500 MG PO TABS: 500.0000 mg | ORAL_TABLET | Freq: Two times a day (BID) | ORAL | 0 refills | Status: AC

## 2024-11-01 NOTE — Telephone Encounter (Signed)
 Thank you VC JMP

## 2024-11-01 NOTE — Telephone Encounter (Signed)
 Received a page to on-call.  Patient with acute onset LLQ abdominal pain starting yesterday.  Feels just like her prior episodes of diverticulitis.  Similar episode in February and August of this year.  Was evaluated in the GI clinic earlier this month with plan for colonoscopy in March.  Otherwise tolerating p.o. intake without issue.  No GI bleed.   Sent in Rx for Cipro  500 mg BID and metronidazole  250 mg TID x 7 days for diverticulitis  Discussed ER return precautions.  All questions answered and appreciative for callback.  Dottie, can you please call to check in on her after the holiday and see how she is doing.

## 2024-11-03 NOTE — Telephone Encounter (Addendum)
 Patient of Dr Darilyn seen recently by Camie Furbish, PA-C and called on call doctor over holiday with acute diverticulitis.   I spoke to patient this morning and she says that she is on day 3 of cipro /flagyl  combination and feels much better. States she is only getting small twinges of pain at this time in the lower abdomen. Was having constipation but notes this is no longer a problem either.   Patient has asked to schedule recall colonoscopy for March. Schedule was not available previously. Patient has been scheduled for colonoscopy with primary GI, Dr Avram on 01/04/25 and in person previsit on 12/21/24. She verbalizes understanding of these appointments.

## 2024-12-21 ENCOUNTER — Encounter

## 2025-01-04 ENCOUNTER — Encounter: Admitting: Internal Medicine
